# Patient Record
Sex: Female | Born: 1980 | Race: Black or African American | Hispanic: No | Marital: Single | Smoking: Current every day smoker
Health system: Southern US, Community
[De-identification: ages and names within clinical notes are randomized; demographics above are authoritative.]

## PROBLEM LIST (undated history)

## (undated) DIAGNOSIS — F329 Major depressive disorder, single episode, unspecified: Secondary | ICD-10-CM

## (undated) DIAGNOSIS — D649 Anemia, unspecified: Secondary | ICD-10-CM

## (undated) DIAGNOSIS — I10 Essential (primary) hypertension: Secondary | ICD-10-CM

## (undated) DIAGNOSIS — F32A Depression, unspecified: Secondary | ICD-10-CM

## (undated) DIAGNOSIS — L0291 Cutaneous abscess, unspecified: Secondary | ICD-10-CM

## (undated) DIAGNOSIS — A549 Gonococcal infection, unspecified: Secondary | ICD-10-CM

## (undated) DIAGNOSIS — A749 Chlamydial infection, unspecified: Secondary | ICD-10-CM

## (undated) DIAGNOSIS — E0500 Thyrotoxicosis with diffuse goiter without thyrotoxic crisis or storm: Secondary | ICD-10-CM

## (undated) HISTORY — PX: WISDOM TOOTH EXTRACTION: SHX21

---

## 1999-01-29 ENCOUNTER — Inpatient Hospital Stay (HOSPITAL_COMMUNITY): Admission: AD | Admit: 1999-01-29 | Discharge: 1999-01-29 | Payer: Self-pay | Admitting: Obstetrics

## 1999-04-21 ENCOUNTER — Observation Stay (HOSPITAL_COMMUNITY): Admission: AD | Admit: 1999-04-21 | Discharge: 1999-04-22 | Payer: Self-pay | Admitting: Obstetrics

## 1999-05-15 ENCOUNTER — Inpatient Hospital Stay (HOSPITAL_COMMUNITY): Admission: AD | Admit: 1999-05-15 | Discharge: 1999-05-15 | Payer: Self-pay | Admitting: Obstetrics

## 1999-05-26 ENCOUNTER — Inpatient Hospital Stay (HOSPITAL_COMMUNITY): Admission: AD | Admit: 1999-05-26 | Discharge: 1999-05-26 | Payer: Self-pay | Admitting: Obstetrics

## 1999-06-18 ENCOUNTER — Inpatient Hospital Stay (HOSPITAL_COMMUNITY): Admission: AD | Admit: 1999-06-18 | Discharge: 1999-06-20 | Payer: Self-pay | Admitting: Obstetrics

## 2000-01-17 ENCOUNTER — Emergency Department (HOSPITAL_COMMUNITY): Admission: EM | Admit: 2000-01-17 | Discharge: 2000-01-17 | Payer: Self-pay | Admitting: Emergency Medicine

## 2000-01-17 ENCOUNTER — Encounter: Payer: Self-pay | Admitting: Emergency Medicine

## 2000-03-02 ENCOUNTER — Other Ambulatory Visit: Admission: RE | Admit: 2000-03-02 | Discharge: 2000-03-02 | Payer: Self-pay | Admitting: Obstetrics

## 2000-11-21 ENCOUNTER — Other Ambulatory Visit: Admission: RE | Admit: 2000-11-21 | Discharge: 2000-11-21 | Payer: Self-pay | Admitting: Obstetrics

## 2000-11-22 ENCOUNTER — Emergency Department (HOSPITAL_COMMUNITY): Admission: EM | Admit: 2000-11-22 | Discharge: 2000-11-22 | Payer: Self-pay | Admitting: Emergency Medicine

## 2001-09-07 ENCOUNTER — Emergency Department (HOSPITAL_COMMUNITY): Admission: EM | Admit: 2001-09-07 | Discharge: 2001-09-07 | Payer: Self-pay | Admitting: Emergency Medicine

## 2001-10-28 ENCOUNTER — Emergency Department (HOSPITAL_COMMUNITY): Admission: EM | Admit: 2001-10-28 | Discharge: 2001-10-28 | Payer: Self-pay | Admitting: *Deleted

## 2001-10-30 ENCOUNTER — Emergency Department (HOSPITAL_COMMUNITY): Admission: EM | Admit: 2001-10-30 | Discharge: 2001-10-30 | Payer: Self-pay | Admitting: Emergency Medicine

## 2002-06-24 ENCOUNTER — Emergency Department (HOSPITAL_COMMUNITY): Admission: EM | Admit: 2002-06-24 | Discharge: 2002-06-24 | Payer: Self-pay | Admitting: Emergency Medicine

## 2003-07-15 ENCOUNTER — Emergency Department (HOSPITAL_COMMUNITY): Admission: EM | Admit: 2003-07-15 | Discharge: 2003-07-15 | Payer: Self-pay | Admitting: Emergency Medicine

## 2003-07-23 ENCOUNTER — Other Ambulatory Visit: Admission: RE | Admit: 2003-07-23 | Discharge: 2003-07-23 | Payer: Self-pay | Admitting: Family Medicine

## 2004-03-31 ENCOUNTER — Emergency Department (HOSPITAL_COMMUNITY): Admission: EM | Admit: 2004-03-31 | Discharge: 2004-03-31 | Payer: Self-pay | Admitting: Emergency Medicine

## 2004-06-07 ENCOUNTER — Ambulatory Visit: Payer: Self-pay | Admitting: Psychiatry

## 2004-06-07 ENCOUNTER — Other Ambulatory Visit (HOSPITAL_COMMUNITY): Admission: RE | Admit: 2004-06-07 | Discharge: 2004-06-18 | Payer: Self-pay | Admitting: Psychiatry

## 2005-01-17 ENCOUNTER — Emergency Department (HOSPITAL_COMMUNITY): Admission: EM | Admit: 2005-01-17 | Discharge: 2005-01-17 | Payer: Self-pay | Admitting: Family Medicine

## 2005-03-08 ENCOUNTER — Emergency Department (HOSPITAL_COMMUNITY): Admission: EM | Admit: 2005-03-08 | Discharge: 2005-03-08 | Payer: Self-pay | Admitting: Emergency Medicine

## 2005-03-30 ENCOUNTER — Emergency Department (HOSPITAL_COMMUNITY): Admission: EM | Admit: 2005-03-30 | Discharge: 2005-03-30 | Payer: Self-pay | Admitting: Emergency Medicine

## 2005-06-14 ENCOUNTER — Emergency Department (HOSPITAL_COMMUNITY): Admission: EM | Admit: 2005-06-14 | Discharge: 2005-06-14 | Payer: Self-pay | Admitting: Emergency Medicine

## 2005-06-15 ENCOUNTER — Emergency Department (HOSPITAL_COMMUNITY): Admission: EM | Admit: 2005-06-15 | Discharge: 2005-06-15 | Payer: Self-pay | Admitting: Emergency Medicine

## 2005-11-24 ENCOUNTER — Emergency Department (HOSPITAL_COMMUNITY): Admission: EM | Admit: 2005-11-24 | Discharge: 2005-11-24 | Payer: Self-pay | Admitting: Family Medicine

## 2005-11-26 ENCOUNTER — Emergency Department (HOSPITAL_COMMUNITY): Admission: EM | Admit: 2005-11-26 | Discharge: 2005-11-26 | Payer: Self-pay | Admitting: Emergency Medicine

## 2006-11-08 ENCOUNTER — Emergency Department (HOSPITAL_COMMUNITY): Admission: EM | Admit: 2006-11-08 | Discharge: 2006-11-08 | Payer: Self-pay | Admitting: Emergency Medicine

## 2007-08-23 ENCOUNTER — Emergency Department (HOSPITAL_COMMUNITY): Admission: EM | Admit: 2007-08-23 | Discharge: 2007-08-23 | Payer: Self-pay | Admitting: Emergency Medicine

## 2008-05-06 ENCOUNTER — Emergency Department (HOSPITAL_COMMUNITY): Admission: EM | Admit: 2008-05-06 | Discharge: 2008-05-06 | Payer: Self-pay | Admitting: Family Medicine

## 2008-10-16 ENCOUNTER — Emergency Department (HOSPITAL_COMMUNITY): Admission: EM | Admit: 2008-10-16 | Discharge: 2008-10-16 | Payer: Self-pay | Admitting: Emergency Medicine

## 2009-04-02 ENCOUNTER — Emergency Department (HOSPITAL_COMMUNITY): Admission: EM | Admit: 2009-04-02 | Discharge: 2009-04-02 | Payer: Self-pay | Admitting: Emergency Medicine

## 2009-12-01 ENCOUNTER — Emergency Department (HOSPITAL_COMMUNITY): Admission: EM | Admit: 2009-12-01 | Discharge: 2009-12-01 | Payer: Self-pay | Admitting: Family Medicine

## 2010-03-12 ENCOUNTER — Emergency Department (HOSPITAL_COMMUNITY): Admission: EM | Admit: 2010-03-12 | Discharge: 2010-03-12 | Payer: Self-pay | Admitting: Emergency Medicine

## 2010-03-13 ENCOUNTER — Emergency Department (HOSPITAL_COMMUNITY): Admission: EM | Admit: 2010-03-13 | Discharge: 2010-03-13 | Payer: Self-pay | Admitting: Family Medicine

## 2010-09-05 ENCOUNTER — Encounter: Payer: Self-pay | Admitting: Family Medicine

## 2010-11-20 LAB — DIFFERENTIAL
Eosinophils Absolute: 0 10*3/uL (ref 0.0–0.7)
Lymphs Abs: 0.8 10*3/uL (ref 0.7–4.0)
Monocytes Absolute: 0.6 10*3/uL (ref 0.1–1.0)
Monocytes Relative: 8 % (ref 3–12)
Neutrophils Relative %: 80 % — ABNORMAL HIGH (ref 43–77)

## 2010-11-20 LAB — COMPREHENSIVE METABOLIC PANEL
ALT: 14 U/L (ref 0–35)
AST: 19 U/L (ref 0–37)
Albumin: 4 g/dL (ref 3.5–5.2)
Calcium: 8.9 mg/dL (ref 8.4–10.5)
GFR calc Af Amer: >60 mL/min (ref >60)
Glucose, Bld: 119 mg/dL — ABNORMAL HIGH (ref 70–99)
Sodium: 137 mEq/L (ref 135–145)
Total Protein: 7.1 g/dL (ref 6.0–8.3)

## 2010-11-20 LAB — URINALYSIS, ROUTINE W REFLEX MICROSCOPIC
Bilirubin Urine: NEGATIVE
Ketones, ur: NEGATIVE mg/dL
Nitrite: NEGATIVE
Protein, ur: NEGATIVE mg/dL
Urobilinogen, UA: 0.2 mg/dL (ref 0.0–1.0)

## 2010-11-20 LAB — CBC
MCHC: 32.4 g/dL (ref 30.0–36.0)
Platelets: 247 10*3/uL (ref 150–400)
RDW: 14.2 % (ref 11.5–15.5)

## 2010-11-20 LAB — PREGNANCY, URINE: Preg Test, Ur: NEGATIVE

## 2011-06-14 ENCOUNTER — Inpatient Hospital Stay (INDEPENDENT_AMBULATORY_CARE_PROVIDER_SITE_OTHER)
Admission: RE | Admit: 2011-06-14 | Discharge: 2011-06-14 | Disposition: A | Discharge status: Home / Self Care | Payer: Medicaid Other | Source: Ambulatory Visit | Attending: Family Medicine | Admitting: Family Medicine

## 2011-06-14 DIAGNOSIS — J069 Acute upper respiratory infection, unspecified: Secondary | ICD-10-CM

## 2011-07-29 ENCOUNTER — Emergency Department (INDEPENDENT_AMBULATORY_CARE_PROVIDER_SITE_OTHER)
Admission: EM | Admit: 2011-07-29 | Discharge: 2011-07-29 | Disposition: A | Discharge status: Home / Self Care | Payer: Self-pay | Source: Home / Self Care | Attending: Family Medicine | Admitting: Family Medicine

## 2011-07-29 DIAGNOSIS — L03319 Cellulitis of trunk, unspecified: Secondary | ICD-10-CM

## 2011-07-29 DIAGNOSIS — L02219 Cutaneous abscess of trunk, unspecified: Secondary | ICD-10-CM

## 2011-07-29 MED ORDER — DOXYCYCLINE HYCLATE 100 MG PO CAPS: 100.0000 mg | ORAL_CAPSULE | Freq: Two times a day (BID) | ORAL | Status: AC

## 2011-07-29 MED ORDER — IBUPROFEN 600 MG PO TABS: 600.0000 mg | ORAL_TABLET | Freq: Three times a day (TID) | ORAL | Status: AC | PRN

## 2011-07-29 MED ORDER — CHLORHEXIDINE GLUCONATE 4 % EX LIQD: 60.0000 mL | Freq: Every day | CUTANEOUS | Status: AC | PRN

## 2011-07-29 NOTE — ED Notes (Signed)
C/o boil to lt labia since yesterday.  Has been taking a friends doxycycline

## 2011-07-29 NOTE — ED Provider Notes (Signed)
History     CSN: [id number] Arrival date & time: 07/29/2011  6:45 PM   First MD Initiated Contact with Patient 07/29/11 1749      Chief Complaint  Patient presents with  . Recurrent Skin Infections    (Consider location/radiation/quality/duration/timing/severity/associated sxs/prior treatment) HPI Comments: 30 y/o female no chronic comorbidities here c/o "boil" in left pubic area for 2 days. Has h/o boil in arm pit. Pt shaved her pubis about 1 week ago. Area more tender and red, no spontaneous drainage. Took 2 doxycycline pills that got from a friend leftover.   History reviewed. No pertinent past medical history.  History reviewed. No pertinent past surgical history.  No family history on file.  History  Substance Use Topics  . Smoking status: Current Everyday Smoker  . Smokeless tobacco: Not on file  . Alcohol Use: No    OB History    Grav Para Term Preterm Abortions TAB SAB Ect Mult Living                  Review of Systems  Constitutional: Negative for fever, chills and appetite change.  Gastrointestinal: Negative for abdominal pain.  Genitourinary: Negative for vaginal discharge, genital sores and pelvic pain.  Skin:       "Boil"    Allergies  Review of patient's allergies indicates no known allergies.  Home Medications   Current Outpatient Rx  Name Route Sig Dispense Refill  . CHLORHEXIDINE GLUCONATE 4 % EX LIQD Topical Apply 60 mLs (4 application total) topically daily as needed. 120 mL 0  . DOXYCYCLINE HYCLATE 100 MG PO CAPS Oral Take 1 capsule (100 mg total) by mouth 2 (two) times daily. 20 capsule 0  . IBUPROFEN 600 MG PO TABS Oral Take 1 tablet (600 mg total) by mouth every 8 (eight) hours as needed for pain. 20 tablet 0    BP 120/77  Pulse 99  Temp(Src) 98.7 F (37.1 C) (Oral)  Resp 16  LMP 06/28/2011  Physical Exam  Nursing note and vitals reviewed. Constitutional: She is oriented to person, place, and time. She appears well-developed and  well-nourished. No distress.  Cardiovascular: Normal heart sounds.   Pulmonary/Chest: Breath sounds normal.  Abdominal: Soft. There is no tenderness.  Lymphadenopathy:    She has no cervical adenopathy.  Neurological: She is alert and oriented to person, place, and time.  Skin:       Abscess about 3x2cm left suprapubic area. Tender fluctuant.     ED Course  INCISION AND DRAINAGE Date/Time: 07/29/2011 7:34 PM Performed by: Sharin Grave Authorized by: Sharin Grave Consent: Verbal consent obtained. Risks and benefits: risks, benefits and alternatives were discussed Consent given by: patient Patient understanding: patient states understanding of the procedure being performed Type: abscess Location: left suprapubic area. Anesthesia: local infiltration Local anesthetic: lidocaine 1% with epinephrine Anesthetic total: 2 ml Incision type: single straight Complexity: simple Drainage: purulent Drainage amount: copious Wound treatment: wound left open and drain placed Packing material: 1/2 in iodoform gauze Patient tolerance: Patient tolerated the procedure well with no immediate complications.   (including critical care time)   Labs Reviewed  CULTURE, ROUTINE-ABSCESS   No results found.   1. Abscess of pubic region       MDM          Sharin Grave, MD 07/31/11 [phone]

## 2011-07-31 ENCOUNTER — Encounter (HOSPITAL_COMMUNITY): Payer: Self-pay

## 2011-07-31 ENCOUNTER — Emergency Department (INDEPENDENT_AMBULATORY_CARE_PROVIDER_SITE_OTHER)
Admission: EM | Admit: 2011-07-31 | Discharge: 2011-07-31 | Disposition: A | Discharge status: Home / Self Care | Payer: Self-pay | Source: Home / Self Care | Attending: Family Medicine | Admitting: Family Medicine

## 2011-07-31 DIAGNOSIS — L03319 Cellulitis of trunk, unspecified: Secondary | ICD-10-CM

## 2011-07-31 DIAGNOSIS — L02219 Cutaneous abscess of trunk, unspecified: Secondary | ICD-10-CM

## 2011-07-31 HISTORY — DX: Cutaneous abscess, unspecified: L02.91

## 2011-07-31 NOTE — ED Provider Notes (Addendum)
History     CSN: [id number] Arrival date & time: 07/31/2011 11:56 AM   First MD Initiated Contact with Patient 07/31/11 1123      Chief Complaint  Patient presents with  . Wound Check    (Consider location/radiation/quality/duration/timing/severity/associated sxs/prior treatment) Patient is a 30 y.o. female presenting with wound check. The history is provided by the patient.  Wound Check  She was treated in the ED 2 to 3 days ago. Previous treatment in the ED includes I&D of abscess. Treatments since wound repair include oral antibiotics. There has been no drainage from the wound. There is no redness present. There is no swelling present. The pain has no pain. She has no difficulty moving the affected extremity or digit.    Past Medical History  Diagnosis Date  . Abscess     History reviewed. No pertinent past surgical history.  History reviewed. No pertinent family history.  History  Substance Use Topics  . Smoking status: Current Everyday Smoker  . Smokeless tobacco: Not on file  . Alcohol Use: No    OB History    Grav Para Term Preterm Abortions TAB SAB Ect Mult Living                  Review of Systems  Constitutional: Negative.   Skin: Positive for wound.    Allergies  Review of patient's allergies indicates no known allergies.  Home Medications   Current Outpatient Rx  Name Route Sig Dispense Refill  . CHLORHEXIDINE GLUCONATE 4 % EX LIQD Topical Apply 60 mLs (4 application total) topically daily as needed. 120 mL 0  . DOXYCYCLINE HYCLATE 100 MG PO CAPS Oral Take 1 capsule (100 mg total) by mouth 2 (two) times daily. 20 capsule 0  . IBUPROFEN 600 MG PO TABS Oral Take 1 tablet (600 mg total) by mouth every 8 (eight) hours as needed for pain. 20 tablet 0    BP 138/88  Pulse 85  Temp(Src) 97.6 F (36.4 C) (Oral)  Resp 18  SpO2 100%  LMP 06/28/2011  Physical Exam  Nursing note and vitals reviewed. Constitutional: She appears well-developed and  well-nourished.  Skin: Skin is warm and dry.       ED Course  Procedures (including critical care time)  Labs Reviewed - No data to display No results found.   1. Abscess of pubic region       MDM          Barkley Bruns, MD 07/31/11 1203  Barkley Bruns, MD 07/31/11 1203  Barkley Bruns, MD 07/31/11 [phone]

## 2011-07-31 NOTE — ED Notes (Signed)
Pt here to have packing removed from pubic area,  I and d done here on Friday

## 2011-08-02 LAB — CULTURE, ROUTINE-ABSCESS

## 2012-01-06 ENCOUNTER — Encounter (HOSPITAL_COMMUNITY): Payer: Self-pay | Admitting: *Deleted

## 2012-01-06 ENCOUNTER — Emergency Department (HOSPITAL_COMMUNITY)
Admission: EM | Admit: 2012-01-06 | Discharge: 2012-01-07 | Disposition: A | Discharge status: Home / Self Care | Payer: Self-pay | Attending: Emergency Medicine | Admitting: Emergency Medicine

## 2012-01-06 DIAGNOSIS — R300 Dysuria: Secondary | ICD-10-CM | POA: Insufficient documentation

## 2012-01-06 DIAGNOSIS — F17200 Nicotine dependence, unspecified, uncomplicated: Secondary | ICD-10-CM | POA: Insufficient documentation

## 2012-01-06 LAB — URINALYSIS, ROUTINE W REFLEX MICROSCOPIC
Ketones, ur: 15 mg/dL — AB
Nitrite: POSITIVE — AB
Protein, ur: 30 mg/dL — AB
pH: 6 (ref 5.0–8.0)

## 2012-01-06 LAB — URINE MICROSCOPIC-ADD ON

## 2012-01-06 NOTE — ED Notes (Signed)
The pt has had painful urination for the past week.  No nv or diarrhea.  lmp now

## 2012-01-07 MED ORDER — SULFAMETHOXAZOLE-TMP DS 800-160 MG PO TABS
1.0000 | ORAL_TABLET | Freq: Once | ORAL | Status: AC
  Administered 2012-01-07: 1 via ORAL
  Filled 2012-01-07: qty 1

## 2012-01-07 MED ORDER — PHENAZOPYRIDINE HCL 200 MG PO TABS: 200.0000 mg | ORAL_TABLET | Freq: Three times a day (TID) | ORAL | Status: AC

## 2012-01-07 MED ORDER — SULFAMETHOXAZOLE-TRIMETHOPRIM 800-160 MG PO TABS: 1.0000 | ORAL_TABLET | Freq: Two times a day (BID) | ORAL | Status: AC

## 2012-01-07 MED ORDER — PHENAZOPYRIDINE HCL 100 MG PO TABS
200.0000 mg | ORAL_TABLET | Freq: Once | ORAL | Status: AC
  Administered 2012-01-07: 200 mg via ORAL
  Filled 2012-01-07: qty 2

## 2012-01-07 NOTE — ED Notes (Signed)
Pt ambulates to restroom

## 2012-01-07 NOTE — Discharge Instructions (Signed)
You have signs of urinary tract infection.  Use Bactrim for infection and per him for the pain with urination.  Followup with your Dr. if your symptoms.  Last more than 3-4 days after starting the antibiotic.  Return for worse or uncontrolled symptoms

## 2012-01-07 NOTE — ED Provider Notes (Signed)
History     CSN: [id number]  Arrival date & time 01/06/12  2317   First MD Initiated Contact with Patient 01/07/12 0104      Chief Complaint  Patient presents with  . painful urination     (Consider location/radiation/quality/duration/timing/severity/associated sxs/prior treatment) HPI The patient is a 31 year old, female, with no significant past medical history, who presents emergency department complaining of dysuria and frequency.  For the past week.  She denies nausea, vomiting.  She denies back pain, abdominal pain, or fevers, or chills.  She denies vaginal discharge or rash.  In her pelvic area.  She did begin her menstrual cycle.  Today.  Past Medical History  Diagnosis Date  . Abscess     History reviewed. No pertinent past surgical history.  No family history on file.  History  Substance Use Topics  . Smoking status: Current Everyday Smoker  . Smokeless tobacco: Not on file  . Alcohol Use: No    OB History    Grav Para Term Preterm Abortions TAB SAB Ect Mult Living                  Review of Systems  Constitutional: Negative for fever and chills.  Gastrointestinal: Negative for nausea, vomiting and abdominal pain.  Genitourinary: Positive for dysuria and frequency. Negative for flank pain, vaginal discharge and genital sores.  Musculoskeletal: Negative for back pain.  Skin: Negative for rash.  Neurological: Negative for headaches.  Psychiatric/Behavioral: Negative for confusion.  All other systems reviewed and are negative.    Allergies  Percocet and Vicodin  Home Medications   Current Outpatient Rx  Name Route Sig Dispense Refill  . IBUPROFEN 200 MG PO TABS Oral Take 200 mg by mouth every 6 (six) hours as needed. For pain    . AZO TABS PO Oral Take 1 tablet by mouth daily.    Marland Kitchen PHENAZOPYRIDINE HCL 200 MG PO TABS Oral Take 1 tablet (200 mg total) by mouth 3 (three) times daily. 6 tablet 0  . SULFAMETHOXAZOLE-TRIMETHOPRIM 800-160 MG PO TABS Oral  Take 1 tablet by mouth 2 (two) times daily. 20 tablet 0    BP 117/75  Pulse 91  Temp(Src) 98.6 F (37 C) (Oral)  Resp 16  SpO2 100%  LMP 01/06/2012  Physical Exam  Vitals reviewed. Constitutional: She is oriented to person, place, and time. She appears well-developed and well-nourished. No distress.  HENT:  Head: Normocephalic and atraumatic.  Eyes: Conjunctivae are normal.  Neck: Normal range of motion. Neck supple.  Pulmonary/Chest: Effort normal.  Abdominal: Soft. There is no tenderness. There is no guarding.  Genitourinary:       No CVA, tenderness  Musculoskeletal: Normal range of motion.  Neurological: She is alert and oriented to person, place, and time.  Skin: Skin is dry.  Psychiatric: She has a normal mood and affect.    ED Course  Procedures (including critical care time) Probable UTI  Labs Reviewed  URINALYSIS, ROUTINE W REFLEX MICROSCOPIC - Abnormal; Notable for the following:    Color, Urine AMBER (*) BIOCHEMICALS MAY BE AFFECTED BY COLOR   APPearance CLOUDY (*)    Specific Gravity, Urine 1.031 (*)    Hgb urine dipstick TRACE (*)    Ketones, ur 15 (*)    Protein, ur 30 (*)    Nitrite POSITIVE (*)    Leukocytes, UA SMALL (*)    All other components within normal limits  URINE MICROSCOPIC-ADD ON - Abnormal; Notable for the following:  Squamous Epithelial / LPF MANY (*)    Bacteria, UA FEW (*)    Casts HYALINE CASTS (*)    All other components within normal limits  PREGNANCY, URINE   No results found.   1. Dysuria       MDM  Probable UTI, with no signs of systemic illness.  No evidence of pyelonephritis.         Cheri Guppy, MD 01/07/12 [phone]

## 2012-01-07 NOTE — ED Notes (Signed)
Pt presents with c/o urinary frequency and abdominal cramping  x 1 week. Denies n/v. MD at bedside for eval

## 2012-04-29 ENCOUNTER — Encounter (HOSPITAL_COMMUNITY): Payer: Self-pay | Admitting: *Deleted

## 2012-04-29 ENCOUNTER — Emergency Department (INDEPENDENT_AMBULATORY_CARE_PROVIDER_SITE_OTHER)
Admission: EM | Admit: 2012-04-29 | Discharge: 2012-04-29 | Disposition: A | Discharge status: Home / Self Care | Payer: Self-pay | Source: Home / Self Care | Attending: Emergency Medicine | Admitting: Emergency Medicine

## 2012-04-29 DIAGNOSIS — N390 Urinary tract infection, site not specified: Secondary | ICD-10-CM

## 2012-04-29 LAB — POCT URINALYSIS DIP (DEVICE)
Glucose, UA: NEGATIVE mg/dL
Ketones, ur: NEGATIVE mg/dL
Specific Gravity, Urine: >=1.03 (ref 1.005–1.030)

## 2012-04-29 LAB — WET PREP, GENITAL: Yeast Wet Prep HPF POC: NONE SEEN

## 2012-04-29 LAB — POCT PREGNANCY, URINE: Preg Test, Ur: NEGATIVE

## 2012-04-29 MED ORDER — CEPHALEXIN 500 MG PO CAPS: 500.0000 mg | ORAL_CAPSULE | Freq: Three times a day (TID) | ORAL | Status: AC

## 2012-04-29 NOTE — ED Provider Notes (Signed)
History     CSN: [id number]  Arrival date & time 04/29/12  1415   First MD Initiated Contact with Patient 04/29/12 1451      Chief Complaint  Patient presents with  . Urinary Frequency  . Abdominal Pain  . SEXUALLY TRANSMITTED DISEASE    (Consider location/radiation/quality/duration/timing/severity/associated sxs/prior treatment) HPI Comments: Patient process urgent care complaining of about a week of increased frequency pressure with urination and also requests to be checked for STDs although denies any vaginal discharge or protected intercourse. She also describes that she has not been resting and sleeping well as her mother passed away suddenly in the month of August. Has been expressing significant anxiety and insomnia since then. Denies any constitutional symptoms such as fevers generalized malaise arthralgias myalgias or spontaneous abdominal pain. Describes discomfort in her lower abdomen is related to cramping discomfort or when she's urinating. Occasionally feels a sensation that she still needs to urinate but she can't talk. Denies nausea vomiting or flank pain  Patient is a 31 y.o. female presenting with frequency and abdominal pain. The history is provided by the patient.  Urinary Frequency This is a new problem. The current episode started more than 1 week ago. The problem occurs constantly. The problem has not changed since onset.Associated symptoms include abdominal pain. Pertinent negatives include no chest pain, no headaches and no shortness of breath. Nothing aggravates the symptoms. Nothing relieves the symptoms. She has tried nothing for the symptoms.  Abdominal Pain The primary symptoms of the illness include abdominal pain and dysuria. The primary symptoms of the illness do not include fever, shortness of breath, vaginal discharge or vaginal bleeding.  The dysuria is associated with frequency and urgency. The dysuria is not associated with hematuria, dyspareunia or  vaginal pain.  Additional symptoms associated with the illness include urgency and frequency. Symptoms associated with the illness do not include hematuria or back pain.    Past Medical History  Diagnosis Date  . Abscess     History reviewed. No pertinent past surgical history.  History reviewed. No pertinent family history.  History  Substance Use Topics  . Smoking status: Current Every Day Smoker  . Smokeless tobacco: Not on file  . Alcohol Use: No    OB History    Grav Para Term Preterm Abortions TAB SAB Ect Mult Living                  Review of Systems  Constitutional: Negative for fever, activity change, appetite change and unexpected weight change.  Respiratory: Negative for shortness of breath.   Cardiovascular: Negative for chest pain.  Gastrointestinal: Positive for abdominal pain.  Genitourinary: Positive for dysuria, urgency and frequency. Negative for hematuria, vaginal bleeding, vaginal discharge, vaginal pain, menstrual problem, pelvic pain and dyspareunia.  Musculoskeletal: Negative for back pain.  Neurological: Negative for headaches.    Allergies  Percocet and Vicodin  Home Medications   Current Outpatient Rx  Name Route Sig Dispense Refill  . CEPHALEXIN 500 MG PO CAPS Oral Take 1 capsule (500 mg total) by mouth 3 (three) times daily. 21 capsule 0  . IBUPROFEN 200 MG PO TABS Oral Take 200 mg by mouth every 6 (six) hours as needed. For pain    . AZO TABS PO Oral Take 1 tablet by mouth daily.      BP 109/73  Pulse 98  Temp 98.6 F (37 C) (Oral)  Resp 16  SpO2 100%  LMP 04/07/2012  Physical Exam  Nursing note  and vitals reviewed. Constitutional: She appears well-developed and well-nourished.  HENT:  Head: Normocephalic and atraumatic.  Mouth/Throat: No oropharyngeal exudate.  Eyes: Conjunctivae normal are normal. No scleral icterus.  Neck: Neck supple.  Abdominal: There is no tenderness.  Musculoskeletal: She exhibits no tenderness.    Neurological: She is alert.  Skin: No rash noted.    ED Course  Procedures (including critical care time)  Labs Reviewed  POCT URINALYSIS DIP (DEVICE) - Abnormal; Notable for the following:    Hgb urine dipstick LARGE (*)     Protein, ur 100 (*)     Nitrite POSITIVE (*)     Leukocytes, UA MODERATE (*)  Biochemical Testing Only. Please order routine urinalysis from main lab if confirmatory testing is needed.   All other components within normal limits  WET PREP, GENITAL - Abnormal; Notable for the following:    Clue Cells Wet Prep HPF POC RARE (*)     WBC, Wet Prep HPF POC MODERATE (*)     All other components within normal limits  POCT PREGNANCY, URINE  GC/CHLAMYDIA PROBE AMP, GENITAL  URINE CULTURE   No results found.   1. Urinary tract infection       MDM  Patient with an abnormal urine, and urinary symptoms. Treated patient with Keflex over a urine culture. As well as obtained samples for wet prep and Chlamydia DNA DNA probe to rule out STD exposure.       Jimmie Molly, MD 04/29/12 [phone]

## 2012-04-29 NOTE — ED Notes (Signed)
Pt with urinary frequency/low abd pressure/denies vaginal discharge has had unprotected intercourse - wants std check - pt also not sleeping well - mother passed away suddenly Apr 28, 2023 this year - mothers birthday this past week - increased anxiety

## 2012-05-01 LAB — URINE CULTURE

## 2012-05-03 NOTE — ED Notes (Signed)
GC/Chlamydia neg., Wet prep: rare clue cells, mod. WBC's, Urine culture: >100,000 colonies E. Coli. Pt. adequately treated with Keflex. Sarah Fritz 05/03/2012

## 2012-06-24 ENCOUNTER — Emergency Department (HOSPITAL_COMMUNITY)
Admission: EM | Admit: 2012-06-24 | Discharge: 2012-06-24 | Disposition: A | Discharge status: Home / Self Care | Payer: Self-pay | Attending: Emergency Medicine | Admitting: Emergency Medicine

## 2012-06-24 ENCOUNTER — Encounter (HOSPITAL_COMMUNITY): Payer: Self-pay | Admitting: Emergency Medicine

## 2012-06-24 DIAGNOSIS — F17200 Nicotine dependence, unspecified, uncomplicated: Secondary | ICD-10-CM | POA: Insufficient documentation

## 2012-06-24 DIAGNOSIS — Z872 Personal history of diseases of the skin and subcutaneous tissue: Secondary | ICD-10-CM | POA: Insufficient documentation

## 2012-06-24 DIAGNOSIS — Z79899 Other long term (current) drug therapy: Secondary | ICD-10-CM | POA: Insufficient documentation

## 2012-06-24 DIAGNOSIS — R350 Frequency of micturition: Secondary | ICD-10-CM | POA: Insufficient documentation

## 2012-06-24 DIAGNOSIS — N390 Urinary tract infection, site not specified: Secondary | ICD-10-CM

## 2012-06-24 LAB — URINALYSIS, ROUTINE W REFLEX MICROSCOPIC
Protein, ur: 100 mg/dL — AB
Urobilinogen, UA: 0.2 mg/dL (ref 0.0–1.0)

## 2012-06-24 LAB — URINE MICROSCOPIC-ADD ON

## 2012-06-24 MED ORDER — IBUPROFEN 600 MG PO TABS: 600.0000 mg | ORAL_TABLET | Freq: Four times a day (QID) | ORAL | Status: DC | PRN

## 2012-06-24 MED ORDER — SULFAMETHOXAZOLE-TRIMETHOPRIM 800-160 MG PO TABS: 1.0000 | ORAL_TABLET | Freq: Two times a day (BID) | ORAL | Status: DC

## 2012-06-24 NOTE — ED Notes (Signed)
Patient states that she is having urinary frequency and burning with urination. The patient denies any abdominal pain

## 2012-06-24 NOTE — ED Notes (Signed)
Patient given discharge instructions, information, prescriptions, and diet order. Patient states that they adequately understand discharge information given and to return to ED if symptoms return or worsen.     

## 2012-06-24 NOTE — ED Provider Notes (Signed)
History     CSN: [id number]  Arrival date & time 06/24/12  1545   First MD Initiated Contact with Patient 06/24/12 1711      Chief Complaint  Patient presents with  . Urinary Tract Infection  . Urinary Frequency    (Consider location/radiation/quality/duration/timing/severity/associated sxs/prior treatment) HPI Comments: Is a 31 year old female, who presents to the emergency department with chief complaint of UTI. Patient endorses pain with urination and frequency. She denies chest pain shortness of breath, nausea, vomiting, abdominal pain, constipation, diarrhea, numbness or tingling in the extremities. She has not tried anything for her pain. Symptoms have lasted for the past couple of days. Her symptoms have been worsening.  Patient is a 31 y.o. female presenting with frequency. The history is provided by the patient. No language interpreter was used.  Urinary Frequency    Past Medical History  Diagnosis Date  . Abscess     History reviewed. No pertinent past surgical history.  History reviewed. No pertinent family history.  History  Substance Use Topics  . Smoking status: Current Every Day Smoker  . Smokeless tobacco: Not on file  . Alcohol Use: No    OB History    Grav Para Term Preterm Abortions TAB SAB Ect Mult Living                  Review of Systems  Genitourinary: Positive for frequency.  All other systems reviewed and are negative.    Allergies  Percocet and Vicodin  Home Medications   Current Outpatient Rx  Name  Route  Sig  Dispense  Refill  . ADULT MULTIVITAMIN W/MINERALS CH   Oral   Take 1 tablet by mouth daily.         . AZO TABS PO   Oral   Take 1 tablet by mouth daily.         . IBUPROFEN 600 MG PO TABS   Oral   Take 1 tablet (600 mg total) by mouth every 6 (six) hours as needed for pain.   30 tablet   0   . SULFAMETHOXAZOLE-TRIMETHOPRIM 800-160 MG PO TABS   Oral   Take 1 tablet by mouth every 12 (twelve) hours.   20  tablet   0     BP 121/82  Pulse 111  Temp 98.9 F (37.2 C) (Oral)  Resp 18  Wt 102 lb (46.267 kg)  SpO2 100%  LMP 06/03/2012  Physical Exam  Nursing note and vitals reviewed. Constitutional: She is oriented to person, place, and time. She appears well-developed and well-nourished.  HENT:  Head: Normocephalic and atraumatic.  Eyes: Conjunctivae normal and EOM are normal. Pupils are equal, round, and reactive to light.  Neck: Normal range of motion. Neck supple.  Cardiovascular: Normal rate and regular rhythm.  Exam reveals no gallop and no friction rub.   No murmur heard. Pulmonary/Chest: Effort normal and breath sounds normal. No respiratory distress. She has no wheezes. She has no rales. She exhibits no tenderness.  Abdominal: Soft. Bowel sounds are normal. She exhibits no distension and no mass. There is no tenderness. There is no rebound and no guarding.  Musculoskeletal: Normal range of motion. She exhibits no edema and no tenderness.  Neurological: She is alert and oriented to person, place, and time.  Skin: Skin is warm and dry.  Psychiatric: She has a normal mood and affect. Her behavior is normal. Judgment and thought content normal.    ED Course  Procedures (including critical care  time)  Labs Reviewed  URINALYSIS, ROUTINE W REFLEX MICROSCOPIC - Abnormal; Notable for the following:    Color, Urine AMBER (*)  BIOCHEMICALS MAY BE AFFECTED BY COLOR   APPearance TURBID (*)     Hgb urine dipstick LARGE (*)     Protein, ur 100 (*)     Nitrite POSITIVE (*)     Leukocytes, UA LARGE (*)     All other components within normal limits  URINE MICROSCOPIC-ADD ON - Abnormal; Notable for the following:    Bacteria, UA MANY (*)     All other components within normal limits  POCT PREGNANCY, URINE  URINE CULTURE   No results found.   1. UTI (lower urinary tract infection)       MDM  31 year old female with UTI. I'm going to discharge the patient on Bactrim as well as  give the patient some ibuprofen for pain. Patient is agreeable with this. Discussed proper wiping technique with the patient in an effort to prevent further UTIs. Patient is stable and ready for discharge.        Roxy Horseman, PA-C 06/24/12 [phone]

## 2012-06-26 LAB — URINE CULTURE: Colony Count: >=100000

## 2012-06-26 NOTE — ED Provider Notes (Signed)
Medical screening examination/treatment/procedure(s) were performed by non-physician practitioner and as supervising physician I was immediately available for consultation/collaboration.    Nelia Shi, MD 06/26/12 [phone]

## 2012-06-27 NOTE — ED Notes (Signed)
+  Urine Patient treated with Sulfa-trim sensitive to same-chart appended per protocol MD. 

## 2012-07-09 ENCOUNTER — Emergency Department (HOSPITAL_COMMUNITY)
Admission: EM | Admit: 2012-07-09 | Discharge: 2012-07-09 | Discharge status: Absent without leave | Payer: Self-pay | Source: Home / Self Care

## 2013-04-14 ENCOUNTER — Inpatient Hospital Stay (HOSPITAL_COMMUNITY)
Admission: AD | Admit: 2013-04-14 | Discharge: 2013-04-14 | Disposition: A | Discharge status: Home / Self Care | Payer: Self-pay | Source: Ambulatory Visit | Attending: Obstetrics & Gynecology | Admitting: Obstetrics & Gynecology

## 2013-04-14 ENCOUNTER — Encounter (HOSPITAL_COMMUNITY): Payer: Self-pay | Admitting: *Deleted

## 2013-04-14 DIAGNOSIS — N390 Urinary tract infection, site not specified: Secondary | ICD-10-CM | POA: Insufficient documentation

## 2013-04-14 DIAGNOSIS — R350 Frequency of micturition: Secondary | ICD-10-CM | POA: Insufficient documentation

## 2013-04-14 HISTORY — DX: Major depressive disorder, single episode, unspecified: F32.9

## 2013-04-14 HISTORY — DX: Depression, unspecified: F32.A

## 2013-04-14 LAB — URINALYSIS, ROUTINE W REFLEX MICROSCOPIC
Bilirubin Urine: NEGATIVE
Ketones, ur: NEGATIVE mg/dL
Nitrite: POSITIVE — AB
Protein, ur: 100 mg/dL — AB
Specific Gravity, Urine: 1.025 (ref 1.005–1.030)
Urobilinogen, UA: 1 mg/dL (ref 0.0–1.0)

## 2013-04-14 LAB — URINE MICROSCOPIC-ADD ON

## 2013-04-14 LAB — WET PREP, GENITAL: Yeast Wet Prep HPF POC: NONE SEEN

## 2013-04-14 MED ORDER — SULFAMETHOXAZOLE-TRIMETHOPRIM 800-160 MG PO TABS: 1.0000 | ORAL_TABLET | Freq: Two times a day (BID) | ORAL | Status: DC

## 2013-04-14 NOTE — MAU Note (Signed)
Pt c/o of UTI symptoms. Frequency and pressure/urgency and odorous urine. Symptoms have been x1 week. Taking AZO OTC.

## 2013-04-14 NOTE — MAU Provider Note (Signed)
Chief Complaint: Urinary Tract Infection   First Provider Initiated Contact with Patient 04/14/13 1400     SUBJECTIVE HPI: Sarah Fritz is a 32 y.o. G1P1001 who presents to maternity admissions reporting constant lower abdominal pain, burning with urination, and urinary frequency.  She has had frequent UTIs and believes this is another infection.  She would also like testing for STDs.  She denies vaginal bleeding, vaginal itching/burning, urinary symptoms, h/a, dizziness, n/v, or fever/chills.     Past Medical History  Diagnosis Date  . Abscess   . Depression    Past Surgical History  Procedure Laterality Date  . No past surgeries     History   Social History  . Marital Status: Divorced    Spouse Name: N/A    Number of Children: N/A  . Years of Education: N/A   Occupational History  . Not on file.   Social History Main Topics  . Smoking status: Current Every Day Smoker  . Smokeless tobacco: Not on file  . Alcohol Use: Yes     Comment: occasional  . Drug Use: Yes    Special: Cocaine     Comment: 3 days ago  . Sexual Activity: Yes    Birth Control/ Protection: None   Other Topics Concern  . Not on file   Social History Narrative  . No narrative on file   No current facility-administered medications on file prior to encounter.   No current outpatient prescriptions on file prior to encounter.   Allergies  Allergen Reactions  . Percocet [Oxycodone-Acetaminophen] Nausea And Vomiting and Other (See Comments)    Sweating, pass out  . Vicodin [Hydrocodone-Acetaminophen] Nausea And Vomiting and Other (See Comments)    Sweating, pass out    ROS: Pertinent items in HPI  OBJECTIVE Blood pressure 113/74, pulse 98, temperature 97.9 F (36.6 C), temperature source Oral, resp. rate 18, height 5\' 2"  (1.575 m), weight 97 lb 12.8 oz (44.362 kg), last menstrual period 03/31/2013. GENERAL: Well-developed, well-nourished female in no acute distress.  HEENT:  Normocephalic HEART: normal rate RESP: normal effort ABDOMEN: Soft, non-tender Negative CVA tenderness EXTREMITIES: Nontender, no edema NEURO: Alert and oriented Pelvic exam: Cervix pink, visually closed, without lesion, scant thin white discharge, vaginal walls and external genitalia normal Bimanual exam: Cervix 0/long/high, firm, anterior, neg CMT, uterus nontender, nonenlarged, adnexa without tenderness, enlargement, or mass  LAB RESULTS Results for orders placed during the hospital encounter of 04/14/13 (from the past 24 hour(s))  URINALYSIS, ROUTINE W REFLEX MICROSCOPIC     Status: Abnormal   Collection Time    04/14/13  1:03 PM      Result Value Range   Color, Urine YELLOW  YELLOW   APPearance CLOUDY (*) CLEAR   Specific Gravity, Urine 1.025  1.005 - 1.030   pH 6.5  5.0 - 8.0   Glucose, UA NEGATIVE  NEGATIVE mg/dL   Hgb urine dipstick MODERATE (*) NEGATIVE   Bilirubin Urine NEGATIVE  NEGATIVE   Ketones, ur NEGATIVE  NEGATIVE mg/dL   Protein, ur 161 (*) NEGATIVE mg/dL   Urobilinogen, UA 1.0  0.0 - 1.0 mg/dL   Nitrite POSITIVE (*) NEGATIVE   Leukocytes, UA LARGE (*) NEGATIVE  URINE MICROSCOPIC-ADD ON     Status: Abnormal   Collection Time    04/14/13  1:03 PM      Result Value Range   Squamous Epithelial / LPF FEW (*) RARE   WBC, UA TOO NUMEROUS TO COUNT  <3 WBC/hpf  RBC / HPF 0-2  <3 RBC/hpf   Bacteria, UA MANY (*) RARE  POCT PREGNANCY, URINE     Status: None   Collection Time    04/14/13  1:15 PM      Result Value Range   Preg Test, Ur NEGATIVE  NEGATIVE    ASSESSMENT 1. UTI (lower urinary tract infection)     PLAN Discharge home Bactrim DS BID x7 days [facility] pending Return to MAU as needed    Sharen Counter Certified Nurse-Midwife 04/14/2013  2:08 PM

## 2013-04-16 LAB — URINE CULTURE: Colony Count: >=100000

## 2013-04-17 LAB — GC/CHLAMYDIA PROBE AMP
CT Probe RNA: POSITIVE — AB
GC Probe RNA: NEGATIVE

## 2013-04-18 ENCOUNTER — Other Ambulatory Visit: Payer: Self-pay | Admitting: Advanced Practice Midwife

## 2013-04-18 DIAGNOSIS — A749 Chlamydial infection, unspecified: Secondary | ICD-10-CM | POA: Insufficient documentation

## 2013-04-18 DIAGNOSIS — N390 Urinary tract infection, site not specified: Secondary | ICD-10-CM | POA: Insufficient documentation

## 2013-04-18 MED ORDER — CIPROFLOXACIN HCL 500 MG PO TABS: 500.0000 mg | ORAL_TABLET | Freq: Two times a day (BID) | ORAL | Status: DC

## 2013-04-18 MED ORDER — DOXYCYCLINE HYCLATE 50 MG PO CAPS: 100.0000 mg | ORAL_CAPSULE | Freq: Two times a day (BID) | ORAL | Status: DC

## 2013-04-18 NOTE — Progress Notes (Signed)
Dx with UTI 04/14/2013 MAU visit. Prescribed Bactrim, but culture shows resistance. Will change antibiotic to Cipro twice a day x1 week (due to history of recurrent UTI).   +Chlamydia. Rx doxycycline. Please notify patient of these antibiotic prescriptions.  [place], CNM 04/18/2013 4:47 AM

## 2013-04-19 ENCOUNTER — Other Ambulatory Visit: Payer: Self-pay | Admitting: General Practice

## 2013-04-19 DIAGNOSIS — A749 Chlamydial infection, unspecified: Secondary | ICD-10-CM

## 2013-04-19 MED ORDER — AZITHROMYCIN 250 MG PO TABS: 1000.0000 mg | ORAL_TABLET | Freq: Once | ORAL | Status: DC

## 2013-04-19 NOTE — Progress Notes (Signed)
Called patient and informed her of results and medications available for pickup. Patient states she was notified the other day but has not got her medications yet and tried to call us yesterday to come in for treatment of her chlamydia. Told her she did not need to come in but the medication would be at her pharmacy and reminded her to inform her partner(s) so they can get treatment as well and to abstain from intercourse for one week following treatment. Patient verbalized understanding to all and had no further questions

## 2013-04-19 NOTE — Addendum Note (Signed)
Addended by: Kathee Delton on: 04/19/2013 11:40 AM   Modules accepted: Orders

## 2013-04-22 ENCOUNTER — Telehealth: Payer: Self-pay | Admitting: General Practice

## 2013-04-22 NOTE — Telephone Encounter (Signed)
Patient called and left message stating she went to pick up her prescriptions for the chlamydia and UTI and was told she has another Rx at the pharmacy for her skin and she wants to know what this is for. Called patient back stating I was returning her phone call and told her to disregard the doxycycline since she has already taking the zithromax. Patient verbalized understanding and had no further questions

## 2013-06-20 ENCOUNTER — Other Ambulatory Visit: Payer: Self-pay

## 2013-12-27 ENCOUNTER — Emergency Department (INDEPENDENT_AMBULATORY_CARE_PROVIDER_SITE_OTHER): Payer: Self-pay

## 2013-12-27 ENCOUNTER — Emergency Department (INDEPENDENT_AMBULATORY_CARE_PROVIDER_SITE_OTHER)
Admission: EM | Admit: 2013-12-27 | Discharge: 2013-12-27 | Disposition: A | Discharge status: Home / Self Care | Payer: Self-pay | Source: Home / Self Care | Attending: Emergency Medicine | Admitting: Emergency Medicine

## 2013-12-27 ENCOUNTER — Encounter (HOSPITAL_COMMUNITY): Payer: Self-pay | Admitting: Emergency Medicine

## 2013-12-27 DIAGNOSIS — S63659A Sprain of metacarpophalangeal joint of unspecified finger, initial encounter: Secondary | ICD-10-CM

## 2013-12-27 DIAGNOSIS — N390 Urinary tract infection, site not specified: Secondary | ICD-10-CM

## 2013-12-27 LAB — POCT URINALYSIS DIP (DEVICE)
Bilirubin Urine: NEGATIVE
GLUCOSE, UA: NEGATIVE mg/dL
Ketones, ur: 15 mg/dL — AB
Nitrite: NEGATIVE
Protein, ur: 30 mg/dL — AB
SPECIFIC GRAVITY, URINE: 1.02 (ref 1.005–1.030)
UROBILINOGEN UA: 0.2 mg/dL (ref 0.0–1.0)
pH: 6 (ref 5.0–8.0)

## 2013-12-27 MED ORDER — CEPHALEXIN 500 MG PO CAPS: 500.0000 mg | ORAL_CAPSULE | Freq: Three times a day (TID) | ORAL | Status: DC

## 2013-12-27 MED ORDER — PHENAZOPYRIDINE HCL 200 MG PO TABS: 200.0000 mg | ORAL_TABLET | Freq: Three times a day (TID) | ORAL | Status: DC | PRN

## 2013-12-27 NOTE — ED Provider Notes (Signed)
Chief Complaint   Chief Complaint  Patient presents with  . Urinary Tract Infection  . Hand Pain    History of Present Illness   Sarah Fritz is a 33 year old female who's had a one-week history of dysuria, frequency, urgency, and hematuria. She denies any vaginal itching or odor. She's had a slight whitish discharge. She's had pain in her lower back and lower abdomen. She denies fever, chills, nausea, and vomiting. She has had a history of urinary tract infections, most recent one being last September.  She also has had right hand pain. 2 weeks ago she bent her little finger back. Ever since then there has been pain over the MCP joint of the right little finger and some swelling. She's able to fully flex and fully extend. No numbness or tingling.  Review of Systems   Other than as noted above, the patient denies any of the following symptoms: General:  No fevers or chills. GI:  No abdominal pain, back pain, nausea, or vomiting. GU:  No hematuria or incontinence. GYN:  No discharge, itching, vulvar pain or lesions, pelvic pain, or abnormal vaginal bleeding.  PMFSH   Past medical history, family history, social history, meds, and allergies were reviewed.  Current meds include lithium and Seroquel. She has a history of depression.  Physical Examination     Vital signs:  BP 128/93  Pulse 79  Temp(Src) 98.7 F (37.1 C) (Oral)  Resp 18  SpO2 100%  LMP 12/13/2013 Gen:  Alert, oriented, in no distress. Lungs:  Clear to auscultation, no wheezes, rales or rhonchi. Heart:  Regular rhythm, no gallop or murmer. Abdomen:  Flat and soft. There was slight suprapubic pain to palpation.  No guarding, or rebound.  No hepato-splenomegaly or mass.  Bowel sounds were normally active.  No hernia. Back:  No CVA tenderness.  Extremities: She has pain to palpation over the MCP joint of the right little finger. There is no swelling or deformity. The joint has a full range of motion actively  and passively with minimal pain. Skin:  Clear, warm and dry.  Labs   Results for orders placed during the hospital encounter of 12/27/13  POCT URINALYSIS DIP (DEVICE)      Result Value Ref Range   Glucose, UA NEGATIVE  NEGATIVE mg/dL   Bilirubin Urine NEGATIVE  NEGATIVE   Ketones, ur 15 (*) NEGATIVE mg/dL   Specific Gravity, Urine 1.020  1.005 - 1.030   Hgb urine dipstick SMALL (*) NEGATIVE   pH 6.0  5.0 - 8.0   Protein, ur 30 (*) NEGATIVE mg/dL   Urobilinogen, UA 0.2  0.0 - 1.0 mg/dL   Nitrite NEGATIVE  NEGATIVE   Leukocytes, UA LARGE (*) NEGATIVE     A urine culture was obtained.  Results are pending at this time and we will call about any positive results.  Radiology   Dg Finger Little Right  12/27/2013   CLINICAL DATA:  Hyperextension injury RIGHT little finger 3 weeks ago, continued pain and intermittent swelling most severe at MCP joint  EXAM: RIGHT LITTLE FINGER 2+V  COMPARISON:  None  FINDINGS: Osseous mineralization normal.  Joint spaces preserved.  No fracture, dislocation, or bone destruction.  IMPRESSION: No acute osseous abnormalities.   Electronically Signed   By: Ulyses SouthwardMark  Boles M.D.   On: 12/27/2013 16:54   I reviewed the images independently and personally and concur with the radiologist's findings.  [facility]   She was put in a  finger splint and the fingers were buddy taped.  Assessment   The primary encounter diagnosis was UTI (lower urinary tract infection). A diagnosis of Sprain, MCP, hand, right was also pertinent to this visit.   No evidence of pyelonephritis.    Suggested that she wear the finger splint continuously for the next 4 weeks, then take it off and start range of motion exercises.  Plan   1.  Meds:  The following meds were prescribed:   Discharge Medication List as of 12/27/2013  5:19 PM    START taking these medications   Details  cephALEXin (KEFLEX) 500 MG capsule Take 1 capsule (500 mg total) by mouth 3 (three) times  daily., Starting 12/27/2013, Until Discontinued, Normal    phenazopyridine (PYRIDIUM) 200 MG tablet Take 1 tablet (200 mg total) by mouth 3 (three) times daily as needed for pain., Starting 12/27/2013, Until Discontinued, Normal        2.  Patient Education/Counseling:  The patient was given appropriate handouts, self care instructions, and instructed in symptomatic relief. The patient was told to avoid intercourse for 10 days, get extra fluids, and return for a follow up with her primary care doctor at the completion of treatment for a repeat UA and culture.    3.  Follow up:  The patient was told to follow up here if no better in 3 to 4 days, or sooner if becoming worse in any way, and given some red flag symptoms such as fever, persistent vomiting, or severe flank or abdominal pain which would prompt immediate return.     Reuben Likesavid C Connery Shiffler, MD 12/27/13 2225

## 2013-12-27 NOTE — ED Notes (Signed)
Reports urinary urgency and pressure x 1 wk.  Also having pain in right hand pinky side.  States "think finger got bent back when grabbing a grocery bag".

## 2013-12-27 NOTE — Discharge Instructions (Signed)
Urinary Tract Infection Urinary tract infections (UTIs) can develop anywhere along your urinary tract. Your urinary tract is your body's drainage system for removing wastes and extra water. Your urinary tract includes two kidneys, two ureters, a bladder, and a urethra. Your kidneys are a pair of bean-shaped organs. Each kidney is about the size of your fist. They are located below your ribs, one on each side of your spine. CAUSES Infections are caused by microbes, which are microscopic organisms, including fungi, viruses, and bacteria. These organisms are so small that they can only be seen through a microscope. Bacteria are the microbes that most commonly cause UTIs. SYMPTOMS  Symptoms of UTIs may vary by age and gender of the patient and by the location of the infection. Symptoms in young women typically include a frequent and intense urge to urinate and a painful, burning feeling in the bladder or urethra during urination. Older women and men are more likely to be tired, shaky, and weak and have muscle aches and abdominal pain. A fever may mean the infection is in your kidneys. Other symptoms of a kidney infection include pain in your back or sides below the ribs, nausea, and vomiting. DIAGNOSIS To diagnose a UTI, your caregiver will ask you about your symptoms. Your caregiver also will ask to provide a urine sample. The urine sample will be tested for bacteria and white blood cells. White blood cells are made by your body to help fight infection. TREATMENT  Typically, UTIs can be treated with medication. Because most UTIs are caused by a bacterial infection, they usually can be treated with the use of antibiotics. The choice of antibiotic and length of treatment depend on your symptoms and the type of bacteria causing your infection. HOME CARE INSTRUCTIONS  If you were prescribed antibiotics, take them exactly as your caregiver instructs you. Finish the medication even if you feel better after you  have only taken some of the medication.  Drink enough water and fluids to keep your urine clear or pale yellow.  Avoid caffeine, tea, and carbonated beverages. They tend to irritate your bladder.  Empty your bladder often. Avoid holding urine for long periods of time.  Empty your bladder before and after sexual intercourse.  After a bowel movement, women should cleanse from front to back. Use each tissue only once. SEEK MEDICAL CARE IF:   You have back pain.  You develop a fever.  Your symptoms do not begin to resolve within 3 days. SEEK IMMEDIATE MEDICAL CARE IF:   You have severe back pain or lower abdominal pain.  You develop chills.  You have nausea or vomiting.  You have continued burning or discomfort with urination. MAKE SURE YOU:   Understand these instructions.  Will watch your condition.  Will get help right away if you are not doing well or get worse. Document Released: 05/11/2005 Document Revised: 01/31/2012 Document Reviewed: 09/09/2011 [facility] Patient Information 2014 [place], [place].  Ligament Sprain Ligaments are tough, fibrous tissues that hold bones together at the joints. A sprain can occur when a ligament is stretched. This injury may take several weeks to heal. HOME CARE INSTRUCTIONS   Rest the injured area for as long as directed by your caregiver. Then slowly start using the joint as directed by your caregiver and as the pain allows.  Keep the affected joint raised if possible to lessen swelling.  Apply ice for 15-20 minutes to the injured area every couple hours for the first half day, then 03-04  times per day for the first 48 hours. Put the ice in a plastic bag and place a towel between the bag of ice and your skin.  Wear any splinting, casting, or elastic bandage applications as instructed.  Only take over-the-counter or prescription medicines for pain, discomfort, or fever as directed by your caregiver. Do not use aspirin immediately after  the injury unless instructed by your caregiver. Aspirin can cause increased bleeding and bruising of the tissues.  If you were given crutches, continue to use them as instructed and do not resume weight bearing on the affected extremity until instructed. SEEK MEDICAL CARE IF:   Your bruising, swelling, or pain increases.  You have cold and numb fingers or toes if your arm or leg was injured. SEEK IMMEDIATE MEDICAL CARE IF:   Your toes are numb or blue if your leg was injured.  Your fingers are numb or blue if your arm was injured.  Your pain is not responding to medicines and continues to stay the same or gets worse. MAKE SURE YOU:   Understand these instructions.  Will watch your condition.  Will get help right away if you are not doing well or get worse. Document Released: 07/29/2000 Document Revised: 10/24/2011 Document Reviewed: 05/27/2008 [facility] Patient Information 2014 [place], [place].

## 2013-12-29 LAB — URINE CULTURE: SPECIAL REQUESTS: NORMAL

## 2013-12-30 NOTE — Progress Notes (Signed)
Quick Note:  Results are abnormal as noted, but have been adequately treated. No further action necessary. ______ 

## 2013-12-30 NOTE — ED Notes (Signed)
Urine culture: >100,000 colonies Proteus Mirabilis.  Pt. adequately treated with Keflex.  Dr. Lorenz CoasterKeller has reviewed. Desiree LucySuzanne M [facility] 12/30/2013

## 2014-01-01 ENCOUNTER — Encounter (HOSPITAL_COMMUNITY): Payer: Self-pay | Admitting: *Deleted

## 2014-01-01 ENCOUNTER — Inpatient Hospital Stay (HOSPITAL_COMMUNITY)
Admission: AD | Admit: 2014-01-01 | Discharge: 2014-01-01 | Disposition: A | Discharge status: Home / Self Care | Payer: Self-pay | Source: Ambulatory Visit | Attending: Obstetrics & Gynecology | Admitting: Obstetrics & Gynecology

## 2014-01-01 DIAGNOSIS — F3289 Other specified depressive episodes: Secondary | ICD-10-CM | POA: Insufficient documentation

## 2014-01-01 DIAGNOSIS — A5901 Trichomonal vulvovaginitis: Secondary | ICD-10-CM

## 2014-01-01 DIAGNOSIS — F329 Major depressive disorder, single episode, unspecified: Secondary | ICD-10-CM | POA: Insufficient documentation

## 2014-01-01 DIAGNOSIS — D649 Anemia, unspecified: Secondary | ICD-10-CM | POA: Insufficient documentation

## 2014-01-01 DIAGNOSIS — F17200 Nicotine dependence, unspecified, uncomplicated: Secondary | ICD-10-CM | POA: Insufficient documentation

## 2014-01-01 DIAGNOSIS — Z8249 Family history of ischemic heart disease and other diseases of the circulatory system: Secondary | ICD-10-CM | POA: Insufficient documentation

## 2014-01-01 HISTORY — DX: Anemia, unspecified: D64.9

## 2014-01-01 LAB — URINALYSIS, ROUTINE W REFLEX MICROSCOPIC
BILIRUBIN URINE: NEGATIVE
Glucose, UA: NEGATIVE mg/dL
HGB URINE DIPSTICK: NEGATIVE
KETONES UR: NEGATIVE mg/dL
Nitrite: NEGATIVE
PROTEIN: NEGATIVE mg/dL
Specific Gravity, Urine: 1.02 (ref 1.005–1.030)
UROBILINOGEN UA: 0.2 mg/dL (ref 0.0–1.0)
pH: 6.5 (ref 5.0–8.0)

## 2014-01-01 LAB — URINE MICROSCOPIC-ADD ON

## 2014-01-01 LAB — WET PREP, GENITAL
Clue Cells Wet Prep HPF POC: NONE SEEN
YEAST WET PREP: NONE SEEN

## 2014-01-01 LAB — POCT PREGNANCY, URINE: Preg Test, Ur: NEGATIVE

## 2014-01-01 MED ORDER — METRONIDAZOLE 500 MG PO TABS
2000.0000 mg | ORAL_TABLET | Freq: Once | ORAL | Status: AC
  Administered 2014-01-01: 2000 mg via ORAL
  Filled 2014-01-01: qty 4

## 2014-01-01 NOTE — MAU Note (Addendum)
Boy[facility] got dx with trich, [facility] only did [facility] pee test. Being treated for UTI.  Here to get treated

## 2014-01-01 NOTE — MAU Provider Note (Signed)
History     CSN: [id number]  Arrival date and time: 01/01/14 1600   First Provider Initiated Contact with Patient 01/01/14 1651      No chief complaint on file.  HPI Comments: Sarah Fritz 33 y.o. female presents to MAU for STI screening. [facility] Was told by her current sexual partner that he had been diagnosed with trichomonas and that she needed to be tested.   Past Medical History  Diagnosis Date  . Abscess   . Depression   . Anemia     Past Surgical History  Procedure Laterality Date  . No past surgeries      Family History  Problem Relation Age of Onset  . Heart disease Mother   . Kidney disease Mother   . COPD Mother   . Cancer Mother   . Cancer Father   . COPD Father     History  Substance Use Topics  . Smoking status: Current Every Day Smoker  . Smokeless tobacco: Not on file  . Alcohol Use: Yes     Comment: occasional    Allergies:  Allergies  Allergen Reactions  . Percocet [Oxycodone-Acetaminophen] Nausea And Vomiting and Other (See Comments)    Sweating, pass out  . Vicodin [Hydrocodone-Acetaminophen] Nausea And Vomiting and Other (See Comments)    Sweating, pass out    Prescriptions prior to admission  Medication Sig Dispense Refill  . cephALEXin (KEFLEX) 500 MG capsule Take 1 capsule (500 mg total) by mouth 3 (three) times daily.  30 capsule  0    Review of Systems  Constitutional: Negative.   Respiratory: Negative.   Cardiovascular: Negative.   Gastrointestinal: Negative for abdominal pain.  Genitourinary:       UTI, currently on Keflex   Neurological: Negative.   Psychiatric/Behavioral: Negative.    Physical Exam   Blood pressure 120/79, pulse 96, temperature 99.1 F (37.3 C), temperature source Oral, resp. rate 16, height 5\' 3"  (1.6 Fritz), weight 47.174 kg (104 lb), last menstrual period 12/13/2013.  Physical Exam  Nursing note and vitals reviewed. HENT:  Head: Normocephalic  and atraumatic.  Eyes: Pupils are equal, round, and reactive to light.  Respiratory: Effort normal.  GI: Soft. There is no tenderness.  Genitourinary:  Genital: Pseudo-pustular,raised lesion present at 3 o'clock position, no pain with palpation Vaginal: Milky-white discharge present Cervix: No lesions noted Bimanual: Negative CMT. No adnexal masses    Results for orders placed during the hospital encounter of 01/01/14 (from the past 24 hour(s))  URINALYSIS, ROUTINE W REFLEX MICROSCOPIC     Status: Abnormal   Collection Time    01/01/14  4:20 PM      Result Value Ref Range   Color, Urine YELLOW  YELLOW   APPearance CLEAR  CLEAR   Specific Gravity, Urine 1.020  1.005 - 1.030   pH 6.5  5.0 - 8.0   Glucose, UA NEGATIVE  NEGATIVE mg/dL   Hgb urine dipstick NEGATIVE  NEGATIVE   Bilirubin Urine NEGATIVE  NEGATIVE   Ketones, ur NEGATIVE  NEGATIVE mg/dL   Protein, ur NEGATIVE  NEGATIVE mg/dL   Urobilinogen, UA 0.2  0.0 - 1.0 mg/dL   Nitrite NEGATIVE  NEGATIVE   Leukocytes, UA MODERATE (*) NEGATIVE  URINE MICROSCOPIC-ADD ON     Status: Abnormal   Collection Time    01/01/14  4:20 PM      Result Value Ref Range   Squamous Epithelial / LPF RARE  RARE   WBC, UA 3-6  <3 WBC/hpf   RBC / HPF 0-2  <3 RBC/hpf   Bacteria, UA FEW (*) RARE   Urine-Other TRICHOMONAS PRESENT    WET PREP, GENITAL     Status: Abnormal   Collection Time    01/01/14  5:07 PM      Result Value Ref Range   Yeast Wet Prep HPF POC NONE SEEN  NONE SEEN   Trich, Wet Prep MODERATE (*) NONE SEEN   Clue Cells Wet Prep HPF POC NONE SEEN  NONE SEEN   WBC, Wet Prep HPF POC MANY (*) NONE SEEN  POCT PREGNANCY, URINE     Status: None   Collection Time    01/01/14  5:22 PM      Result Value Ref Range   Preg Test, Ur NEGATIVE  NEGATIVE     MAU Course  Procedures  MDM Wet Prep, GC/Chlamydia   Assessment and Plan  A: Trich   P:Flagyl 2 Grams PO NOW Partner has been treated Await other STD results    Sarah Fritz  Sarah Fritz 01/01/2014, 5:18 PM

## 2014-01-01 NOTE — MAU Note (Addendum)
Pt states her boyfriend was checked last week and got a call back and was told he had [place]

## 2014-01-01 NOTE — Discharge Instructions (Signed)
Trichomoniasis Trichomoniasis is an infection, caused by the Trichomonas organism, that affects both women and men. In women, the outer female genitalia and the vagina are affected. In men, the penis is mainly affected, but the prostate and other reproductive organs can also be involved. Trichomoniasis is a sexually transmitted disease (STD) and is most often passed to another person through sexual contact. The majority of people who get trichomoniasis do so from a sexual encounter and are also at risk for other STDs. CAUSES   Sexual intercourse with an infected partner.  It can be present in swimming pools or hot tubs. SYMPTOMS   Abnormal gray-green frothy vaginal discharge in women.  Vaginal itching and irritation in women.  Itching and irritation of the area outside the vagina in women.  Penile discharge with or without pain in males.  Inflammation of the urethra (urethritis), causing painful urination.  Bleeding after sexual intercourse. RELATED COMPLICATIONS  Pelvic inflammatory disease.  Infection of the uterus (endometritis).  Infertility.  Tubal (ectopic) pregnancy.  It can be associated with other STDs, including gonorrhea and chlamydia, hepatitis B, and HIV. COMPLICATIONS DURING PREGNANCY  Early (premature) delivery.  Premature rupture of the membranes (PROM).  Low birth weight. DIAGNOSIS   Visualization of Trichomonas under the microscope from the vagina discharge.  Ph of the vagina greater than 4.5, tested with a test tape.  Trich Rapid Test.  Culture of the organism, but this is not usually needed.  It may be found on a Pap test.  Having a "strawberry cervix,"which means the cervix looks very red like a strawberry. TREATMENT   You may be given medication to fight the infection. Inform your caregiver if you could be or are pregnant. Some medications used to treat the infection should not be taken during pregnancy.  Over-the-counter medications or  creams to decrease itching or irritation may be recommended.  Your sexual partner will need to be treated if infected. HOME CARE INSTRUCTIONS   Take all medication prescribed by your caregiver.  Take over-the-counter medication for itching or irritation as directed by your caregiver.  Do not have sexual intercourse while you have the infection.  Do not douche or wear tampons.  Discuss your infection with your partner, as your partner may have acquired the infection from you. Or, your partner may have been the person who transmitted the infection to you.  Have your sex partner examined and treated if necessary.  Practice safe, informed, and protected sex.  See your caregiver for other STD testing. SEEK MEDICAL CARE IF:   You still have symptoms after you finish the medication.  You have an oral temperature above 102 F (38.9 C).  You develop belly (abdominal) pain.  You have pain when you urinate.  You have bleeding after sexual intercourse.  You develop a rash.  The medication makes you sick or makes you throw up (vomit). Document Released: 01/25/2001 Document Revised: 10/24/2011 Document Reviewed: 02/20/2009 [facility] Patient Information 2014 [place]Care, [place].  Sexually Transmitted Disease A sexually transmitted disease (STD) is a disease or infection that may be passed (transmitted) from person to person, usually during sexual activity. This may happen by way of saliva, semen, blood, vaginal mucus, or urine. Common STDs include:   Gonorrhea.   Chlamydia.   Syphilis.   HIV and AIDS.   Genital herpes.   Hepatitis B and C.   Trichomonas.   Human papillomavirus (HPV).   Pubic lice.   Scabies.  Mites.  Bacterial vaginosis. WHAT ARE CAUSES OF  STDs? An STD may be caused by bacteria, a virus, or parasites. STDs are often transmitted during sexual activity if one person is infected. However, they may also be transmitted through nonsexual means. STDs  may be transmitted after:   Sexual intercourse with an infected person.   Sharing sex toys with an infected person.   Sharing needles with an infected person or using unclean piercing or tattoo needles.  Having intimate contact with the genitals, mouth, or rectal areas of an infected person.   Exposure to infected fluids during birth. WHAT ARE THE SIGNS AND SYMPTOMS OF STDs? Different STDs have different symptoms. Some people may not have any symptoms. If symptoms are present, they may include:   Painful or bloody urination.   Pain in the pelvis, abdomen, vagina, anus, throat, or eyes.   Skin rash, itching, irritation, growths, sores (lesions), ulcerations, or warts in the genital or anal area.  Abnormal vaginal discharge with or without bad odor.   Penile discharge in men.   Fever.   Pain or bleeding during sexual intercourse.   Swollen glands in the groin area.   Yellow skin and eyes (jaundice). This is seen with hepatitis.   Swollen testicles.  Infertility.  Sores and blisters in the mouth. HOW ARE STDs DIAGNOSED? To make a diagnosis, your health care provider may:   Take a medical history.   Perform a physical exam.   Take a sample of any discharge for examination.  Swab the throat, cervix, opening to the penis, rectum, or vagina for testing.  Test a sample of your first morning urine.   Perform blood tests.   Perform a Pap smear, if this applies.   Perform a colposcopy.   Perform a laparoscopy.  HOW ARE STDs TREATED? Treatment depends on the STD. Some STDs may be treated but not cured.   Chlamydia, gonorrhea, trichomonas, and syphilis can be cured with antibiotics.   Genital herpes, hepatitis, and HIV can be treated, but not cured, with prescribed medicines. The medicines lessen symptoms.   Genital warts from HPV can be treated with medicine or by freezing, burning (electrocautery), or surgery. Warts may come back.   HPV  cannot be cured with medicine or surgery. However, abnormal areas may be removed from the cervix, vagina, or vulva.   If your diagnosis is confirmed, your recent sexual partners need treatment. This is true even if they are symptom-free or have a negative culture or evaluation. They should not have sex until their health care providers say it is OK. HOW CAN I REDUCE MY RISK OF GETTING AN STD?  Use latex condoms, dental dams, and water-soluble lubricants during sexual activity. Do not use petroleum jelly or oils.  Get vaccinated for HPV and hepatitis. If you have not received these vaccines in the past, talk to your health care provider about whether one or both might be right for you.   Avoid risky sex practices that can break the skin.  WHAT SHOULD I DO IF I THINK I HAVE AN STD?  See your health care provider.   Inform all sexual partners. They should be tested and treated for any STDs.  Do not have sex until your health care provider says it is OK. WHEN SHOULD I GET HELP? Seek immediate medical care if:  You develop severe abdominal pain.  You are a man and notice swelling or pain in the testicles.  You are a woman and notice swelling or pain in your vagina. Document Released: 10/22/2002  Document Revised: 05/22/2013 Document Reviewed: 02/19/2013 [facility] Patient Information 2014 [place]ExitCare, [place]LL[place].

## 2014-01-02 LAB — HIV ANTIBODY (ROUTINE TESTING W REFLEX): HIV 1&2 Ab, 4th Generation: NONREACTIVE

## 2014-01-02 LAB — GC/CHLAMYDIA PROBE AMP
CT PROBE, AMP APTIMA: NEGATIVE
GC Probe RNA: NEGATIVE

## 2014-05-30 ENCOUNTER — Other Ambulatory Visit: Payer: Self-pay

## 2014-06-16 ENCOUNTER — Encounter (HOSPITAL_COMMUNITY): Payer: Self-pay | Admitting: *Deleted

## 2014-10-31 ENCOUNTER — Emergency Department (HOSPITAL_COMMUNITY)
Admission: EM | Admit: 2014-10-31 | Discharge: 2014-10-31 | Disposition: A | Discharge status: Home / Self Care | Payer: Self-pay | Attending: Emergency Medicine | Admitting: Emergency Medicine

## 2014-10-31 ENCOUNTER — Emergency Department (HOSPITAL_COMMUNITY): Payer: Self-pay

## 2014-10-31 ENCOUNTER — Encounter (HOSPITAL_COMMUNITY): Payer: Self-pay

## 2014-10-31 DIAGNOSIS — B9689 Other specified bacterial agents as the cause of diseases classified elsewhere: Secondary | ICD-10-CM

## 2014-10-31 DIAGNOSIS — R52 Pain, unspecified: Secondary | ICD-10-CM

## 2014-10-31 DIAGNOSIS — Z720 Tobacco use: Secondary | ICD-10-CM | POA: Insufficient documentation

## 2014-10-31 DIAGNOSIS — N939 Abnormal uterine and vaginal bleeding, unspecified: Secondary | ICD-10-CM

## 2014-10-31 DIAGNOSIS — Z862 Personal history of diseases of the blood and blood-forming organs and certain disorders involving the immune mechanism: Secondary | ICD-10-CM | POA: Insufficient documentation

## 2014-10-31 DIAGNOSIS — Z3202 Encounter for pregnancy test, result negative: Secondary | ICD-10-CM | POA: Insufficient documentation

## 2014-10-31 DIAGNOSIS — Z792 Long term (current) use of antibiotics: Secondary | ICD-10-CM | POA: Insufficient documentation

## 2014-10-31 DIAGNOSIS — F319 Bipolar disorder, unspecified: Secondary | ICD-10-CM | POA: Insufficient documentation

## 2014-10-31 DIAGNOSIS — N760 Acute vaginitis: Secondary | ICD-10-CM | POA: Insufficient documentation

## 2014-10-31 DIAGNOSIS — Z872 Personal history of diseases of the skin and subcutaneous tissue: Secondary | ICD-10-CM | POA: Insufficient documentation

## 2014-10-31 LAB — URINALYSIS, ROUTINE W REFLEX MICROSCOPIC
Glucose, UA: NEGATIVE mg/dL
Ketones, ur: 15 mg/dL — AB
Leukocytes, UA: NEGATIVE
Nitrite: NEGATIVE
Protein, ur: NEGATIVE mg/dL
Specific Gravity, Urine: 1.027 (ref 1.005–1.030)
Urobilinogen, UA: 1 mg/dL (ref 0.0–1.0)
pH: 6.5 (ref 5.0–8.0)

## 2014-10-31 LAB — CBC WITH DIFFERENTIAL/PLATELET
BASOS PCT: 0 % (ref 0–1)
Basophils Absolute: 0 10*3/uL (ref 0.0–0.1)
EOS PCT: 1 % (ref 0–5)
Eosinophils Absolute: 0.1 10*3/uL (ref 0.0–0.7)
HCT: 39.5 % (ref 36.0–46.0)
HEMOGLOBIN: 12.8 g/dL (ref 12.0–15.0)
LYMPHS ABS: 2.3 10*3/uL (ref 0.7–4.0)
Lymphocytes Relative: 39 % (ref 12–46)
MCH: 23.8 pg — AB (ref 26.0–34.0)
MCHC: 32.4 g/dL (ref 30.0–36.0)
MCV: 73.6 fL — AB (ref 78.0–100.0)
MONO ABS: 0.7 10*3/uL (ref 0.1–1.0)
MONOS PCT: 11 % (ref 3–12)
NEUTROS PCT: 49 % (ref 43–77)
Neutro Abs: 2.8 10*3/uL (ref 1.7–7.7)
Platelets: 285 10*3/uL (ref 150–400)
RBC: 5.37 MIL/uL — AB (ref 3.87–5.11)
RDW: 15.2 % (ref 11.5–15.5)
WBC: 5.8 10*3/uL (ref 4.0–10.5)

## 2014-10-31 LAB — COMPREHENSIVE METABOLIC PANEL
ALBUMIN: 5 g/dL (ref 3.5–5.2)
ALK PHOS: 47 U/L (ref 39–117)
ALT: 12 U/L (ref 0–35)
AST: 18 U/L (ref 0–37)
Anion gap: 7 (ref 5–15)
BUN: 11 mg/dL (ref 6–23)
CO2: 28 mmol/L (ref 19–32)
Calcium: 9.7 mg/dL (ref 8.4–10.5)
Chloride: 106 mmol/L (ref 96–112)
Creatinine, Ser: 0.84 mg/dL (ref 0.50–1.10)
GFR calc Af Amer: >90 mL/min (ref >90)
GFR calc non Af Amer: 90 mL/min — ABNORMAL LOW (ref >90)
Glucose, Bld: 90 mg/dL (ref 70–99)
POTASSIUM: 3.8 mmol/L (ref 3.5–5.1)
SODIUM: 141 mmol/L (ref 135–145)
Total Bilirubin: 2.3 mg/dL — ABNORMAL HIGH (ref 0.3–1.2)
Total Protein: 8.2 g/dL (ref 6.0–8.3)

## 2014-10-31 LAB — RAPID URINE DRUG SCREEN, HOSP PERFORMED
Amphetamines: NOT DETECTED
Barbiturates: NOT DETECTED
Benzodiazepines: NOT DETECTED
Cocaine: POSITIVE — AB
Opiates: NOT DETECTED
Tetrahydrocannabinol: POSITIVE — AB

## 2014-10-31 LAB — URINE MICROSCOPIC-ADD ON

## 2014-10-31 LAB — WET PREP, GENITAL
Trich, Wet Prep: NONE SEEN
WBC, Wet Prep HPF POC: NONE SEEN
Yeast Wet Prep HPF POC: NONE SEEN

## 2014-10-31 LAB — ETHANOL: Alcohol, Ethyl (B): <5 mg/dL (ref 0–9)

## 2014-10-31 LAB — PREGNANCY, URINE: Preg Test, Ur: NEGATIVE

## 2014-10-31 MED ORDER — METRONIDAZOLE 500 MG PO TABS: 500.0000 mg | ORAL_TABLET | Freq: Two times a day (BID) | ORAL | Status: DC
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
## 2014-10-31 NOTE — ED Notes (Signed)
US at bedside

## 2014-10-31 NOTE — ED Notes (Signed)
Pt is talking with a TTS representative.

## 2014-10-31 NOTE — Discharge Instructions (Signed)
Take Flagyl twice daily for 7 days. It is important to complete the entire course of antibiotic. Do not have sexual intercourse for 2 weeks from when you start taking Flagyl. [facility]. Also follow-up with one of the resources provided today for psychiatry regarding your psychiatric medications.  Abnormal Uterine Bleeding Abnormal uterine bleeding can affect women at various stages in life, including teenagers, women in their reproductive years, pregnant women, and women who have reached menopause. Several kinds of uterine bleeding are considered abnormal, including:  Bleeding or spotting between periods.   Bleeding after sexual intercourse.   Bleeding that is heavier or more than normal.   Periods that last longer than usual.  Bleeding after menopause.  Many cases of abnormal uterine bleeding are minor and simple to treat, while others are more serious. Any type of abnormal bleeding should be evaluated by your health care provider. Treatment will depend on the cause of the bleeding. HOME CARE INSTRUCTIONS Monitor your condition for any changes. The following actions may help to alleviate any discomfort you are experiencing:  Avoid the use of tampons and douches as directed by your health care provider.  Change your pads frequently. You should get regular pelvic exams and Pap tests. Keep all follow-up appointments for diagnostic tests as directed by your health care provider.  SEEK MEDICAL CARE IF:   Your bleeding lasts more than 1 week.   You feel dizzy at times.  SEEK IMMEDIATE MEDICAL CARE IF:   You pass out.   You are changing pads every 15 to 30 minutes.   You have abdominal pain.  You have a fever.   You become sweaty or weak.   You are passing large blood clots from the vagina.   You start to feel nauseous and vomit. MAKE SURE YOU:   Understand these instructions.  Will watch your condition.  Will get help right  away if you are not doing well or get worse. Document Released: 08/01/2005 Document Revised: 08/06/2013 Document Reviewed: 02/28/2013 [facility] Patient Information 2015 [place], [place]. This information is not intended to replace advice given to you by your health care provider. Make sure you discuss any questions you have with your health care provider.  Bipolar Disorder Bipolar disorder is a mental illness. The term bipolar disorder actually is used to describe a group of disorders that all share varying degrees of emotional highs and lows that can interfere with daily functioning, such as work, school, or relationships. Bipolar disorder also can lead to drug abuse, hospitalization, and suicide. The emotional highs of bipolar disorder are periods of elation or irritability and high energy. These highs can range from a mild form (hypomania) to a severe form (mania). People experiencing episodes of hypomania may appear energetic, excitable, and highly productive. People experiencing mania may behave impulsively or erratically. They often make poor decisions. They may have difficulty sleeping. The most severe episodes of mania can involve having very distorted beliefs or perceptions about the world and seeing or hearing things that are not real (psychotic delusions and hallucinations).  The emotional lows of bipolar disorder (depression) also can range from mild to severe. Severe episodes of bipolar depression can involve psychotic delusions and hallucinations. Sometimes people with bipolar disorder experience a state of mixed mood. Symptoms of hypomania or mania and depression are both present during this mixed-mood episode. SIGNS AND SYMPTOMS There are signs and symptoms of the episodes of hypomania and mania as well as the episodes of depression.  The signs and symptoms of hypomania and mania are similar but vary in severity. They include:  Inflated self-esteem or feeling of increased  self-confidence.  Decreased need for sleep.  Unusual talkativeness (rapid or pressured speech) or the feeling of a need to keep talking.  Sensation of racing thoughts or constant talking, with quick shifts between topics that may or may not be related (flight of ideas).  Decreased ability to focus or concentrate.  Increased purposeful activity, such as work, studies, or social activity, or nonproductive activity, such as pacing, squirming and fidgeting, or finger and toe tapping.  Impulsive behavior and use of poor judgment, resulting in high-risk activities, such as having unprotected sex or spending excessive amounts of money. Signs and symptoms of depression include the following:   Feelings of sadness, hopelessness, or helplessness.  Frequent or uncontrollable episodes of crying.  Lack of feeling anything or caring about anything.  Difficulty sleeping or sleeping too much.  Inability to enjoy the things you used to enjoy.   Desire to be alone all the time.   Feelings of guilt or worthlessness.  Lack of energy or motivation.   Difficulty concentrating, remembering, or making decisions.  Change in appetite or weight beyond normal fluctuations.  Thoughts of death or the desire to harm yourself. DIAGNOSIS  Bipolar disorder is diagnosed through an assessment by your caregiver. Your caregiver will ask questions about your emotional episodes. There are two main types of bipolar disorder. People with type I bipolar disorder have manic episodes with or without depressive episodes. People with type II bipolar disorder have hypomanic episodes and major depressive episodes, which are more serious than mild depression. The type of bipolar disorder you have can make an important difference in how your illness is monitored and treated. Your caregiver may ask questions about your medical history and use of alcohol or drugs, including prescription medication. Certain medical conditions  and substances also can cause emotional highs and lows that resemble bipolar disorder (secondary bipolar disorder).  TREATMENT  Bipolar disorder is a long-term illness. It is best controlled with continuous treatment rather than treatment only when symptoms occur. The following treatments can be prescribed for bipolar disorders:  Medication--Medication can be prescribed by a doctor that is an expert in treating mental disorders (psychiatrists). Medications called mood stabilizers are usually prescribed to help control the illness. Other medications are sometimes added if symptoms of mania, depression, or psychotic delusions and hallucinations occur despite the use of a mood stabilizer.  Talk therapy--Some forms of talk therapy are helpful in providing support, education, and guidance. A combination of medication and talk therapy is best for managing the disorder over time. A procedure in which electricity is applied to your brain through your scalp (electroconvulsive therapy) is used in cases of severe mania when medication and talk therapy do not work or work too slowly. Document Released: 11/07/2000 Document Revised: 11/26/2012 Document Reviewed: 08/27/2012 [facility] Patient Information 2015 [place]. This information is not intended to replace advice given to you by your health care provider. Make sure you discuss any questions you have with your health care provider.

## 2014-10-31 NOTE — ED Notes (Signed)
Per pt, she had menstrual period 2 weeks ago.  Now having discharge with intercourse.  Slight abdominal pain.  Pt noticed discharge post intercourse.  Also has hemorrhoids.  [facility] to be seen to get back on bipolar meds.  Has been handling things for a year but noticed she has been more angry.  Snapping at family.  [facility]  Has MD on 3/28.  Just got insurance.

## 2014-10-31 NOTE — ED Notes (Signed)
Patient continues to deny SI/HI during the ED visit to this nurse Patient signed Assessment No-Harm Contract at time of DC Patient provided with list of Outpatient resources Patient upset because one of the facilities listed on resource sheet told her to come to ED This nurse asked the patient if she would like to speak with ED charge nurse or [facility]--patient declined Patient did not want repeat VS at time of DC home Patient in NAD at time of DC home

## 2014-10-31 NOTE — ED Provider Notes (Signed)
CSN: [id number]     Arrival date & time 10/31/14  1334 History   First MD Initiated Contact with Patient 10/31/14 1806     Chief Complaint  Patient presents with  . Vaginal Bleeding  . Abdominal Pain     (Consider location/radiation/quality/duration/timing/severity/associated sxs/prior Treatment) HPI Comments: 34 y/o F PMHx of bipolar, anxiety and anemia presenting with vaginal bleeding 2 days. States her last menstrual period lasted from March 1 to the fifth and was normal., 2 days ago she started to have some pink on the toilet tissue, however was unsure if this was from her hemorrhoids. Denies any pain in the area of her hemorrhoids. Last night, she had "rough" intercourse with her boyfriend, and afterwards, noted she had dark clots coming from her vagina when she urinates. Denies dysuria or vaginal pain. She endorses mild, cramping lower abdominal pain. Denies fevers, nausea or vomiting. She is also requesting an evaluation for her medications. States she went to [facility] earlier today since she has been off her medications for bipolar in over a year, and was advised to come to the emergency department. I spoke to her cousin on the phone, and cousin states the patient will "snap" at random and has no control over her emotions. She has an appointment scheduled with [facility] on March 28 to establish primary care. Denies suicidal or homicidal ideations.  The history is provided by the patient and a relative.    Past Medical History  Diagnosis Date  . Abscess   . Depression   . Anemia    Past Surgical History  Procedure Laterality Date  . No past surgeries     Family History  Problem Relation Age of Onset  . Heart disease Mother   . Kidney disease Mother   . COPD Mother   . Cancer Mother   . Cancer Father   . COPD Father    History  Substance Use Topics  . Smoking status: Current Every Day Smoker  . Smokeless tobacco: Not on file  . Alcohol Use: Yes     Comment:  occasional   OB History    Gravida Para Term Preterm AB TAB SAB Ectopic Multiple Living   1 1 1       1      Review of Systems  10 Systems reviewed and are negative for acute change except as noted in the HPI.  Allergies  Percocet and Vicodin  Home Medications   Prior to Admission medications   Medication Sig Start Date End Date Taking? Authorizing Provider  cephALEXin (KEFLEX) 500 MG capsule Take 1 capsule (500 mg total) by mouth 3 (three) times daily. Patient not taking: Reported on 10/31/2014 12/27/13   Reuben Likesavid C Keller, MD  metroNIDAZOLE (FLAGYL) 500 MG tablet Take 1 tablet (500 mg total) by mouth 2 (two) times daily. One po bid x 7 days 10/31/14   Nada Boozerobyn M Aniesha Haughn, PA-C   BP 134/90 mmHg  Pulse 69  Temp(Src) 98.9 F (37.2 C) (Oral)  Resp 16  SpO2 100%  LMP 10/30/2014 Physical Exam  Constitutional: She is oriented to person, place, and time. She appears well-developed and well-nourished. No distress.  HENT:  Head: Normocephalic and atraumatic.  Mouth/Throat: Oropharynx is clear and moist.  Eyes: Conjunctivae are normal.  Neck: Normal range of motion. Neck supple.  Cardiovascular: Normal rate, regular rhythm and normal heart sounds.   Pulmonary/Chest: Effort normal and breath sounds normal.  Abdominal: Soft. Bowel sounds are normal.  Mild suprapubic tenderness. No  peritoneal signs. No CVA tenderness.  Genitourinary: Uterus normal. Cervix exhibits no motion tenderness, no discharge and no friability. Right adnexum displays no mass, no tenderness and no fullness. Left adnexum displays no mass, no tenderness and no fullness. There is bleeding in the vagina. No erythema or tenderness in the vagina. No signs of injury around the vagina.  Musculoskeletal: Normal range of motion. She exhibits no edema.  Neurological: She is alert and oriented to person, place, and time.  Skin: Skin is warm and dry. She is not diaphoretic.  Psychiatric: Her affect is angry. She is agitated. She exhibits a  depressed mood. She expresses no homicidal and no suicidal ideation.    ED Course  Procedures (including critical care time) Labs Review Labs Reviewed  WET PREP, GENITAL - Abnormal; Notable for the following:    Clue Cells Wet Prep HPF POC FEW (*)    All other components within normal limits  CBC WITH DIFFERENTIAL/PLATELET - Abnormal; Notable for the following:    RBC 5.37 (*)    MCV 73.6 (*)    MCH 23.8 (*)    All other components within normal limits  COMPREHENSIVE METABOLIC PANEL - Abnormal; Notable for the following:    Total Bilirubin 2.3 (*)    GFR calc non Af Amer 90 (*)    All other components within normal limits  URINALYSIS, ROUTINE W REFLEX MICROSCOPIC - Abnormal; Notable for the following:    Color, Urine AMBER (*)    Hgb urine dipstick LARGE (*)    Bilirubin Urine SMALL (*)    Ketones, ur 15 (*)    All other components within normal limits  URINE MICROSCOPIC-ADD ON - Abnormal; Notable for the following:    Bacteria, UA FEW (*)    All other components within normal limits  PREGNANCY, URINE  ETHANOL  URINE RAPID DRUG SCREEN (HOSP PERFORMED)  GC/CHLAMYDIA PROBE AMP ([place])    Imaging Review US Transvaginal Non-ob  10/31/2014   CLINICAL DATA:  34 year old female with left pelvic pain for 2 days.  EXAM: TRANSABDOMINAL AND TRANSVAGINAL ULTRASOUND OF PELVIS  TECHNIQUE: Both transabdominal and transvaginal ultrasound examinations of the pelvis were performed. Transabdominal technique was performed for global imaging of the pelvis including uterus, ovaries, adnexal regions, and pelvic cul-de-sac. It was necessary to proceed with endovaginal exam following the transabdominal exam to visualize the endometrium and ovaries.  COMPARISON:  None  FINDINGS: Uterus  Measurements: 7.5 x 3.2 x 3.7 cm. No fibroids or other mass visualized.  Endometrium  Thickness: 3 mm.  No focal abnormality visualized.  Right ovary  Measurements: 3.5 x 3 x 2.5 cm. Normal appearance/no adnexal mass.   Left ovary  Measurements: 3.8 x 2.3 x 1.7 cm. Normal appearance/no adnexal mass.  Other findings  No free fluid or adnexal mass.  IMPRESSION: Normal pelvic ultrasound.   Electronically Signed   By: Harmon Pier M.D.   On: 10/31/2014 21:50   US Pelvis Complete  10/31/2014   CLINICAL DATA:  34 year old female with left pelvic pain for 2 days.  EXAM: TRANSABDOMINAL AND TRANSVAGINAL ULTRASOUND OF PELVIS  TECHNIQUE: Both transabdominal and transvaginal ultrasound examinations of the pelvis were performed. Transabdominal technique was performed for global imaging of the pelvis including uterus, ovaries, adnexal regions, and pelvic cul-de-sac. It was necessary to proceed with endovaginal exam following the transabdominal exam to visualize the endometrium and ovaries.  COMPARISON:  None  FINDINGS: Uterus  Measurements: 7.5 x 3.2 x 3.7 cm. No fibroids or other mass  visualized.  Endometrium  Thickness: 3 mm.  No focal abnormality visualized.  Right ovary  Measurements: 3.5 x 3 x 2.5 cm. Normal appearance/no adnexal mass.  Left ovary  Measurements: 3.8 x 2.3 x 1.7 cm. Normal appearance/no adnexal mass.  Other findings  No free fluid or adnexal mass.  IMPRESSION: Normal pelvic ultrasound.   Electronically Signed   By: Harmon Pier M.D.   On: 10/31/2014 21:50     EKG Interpretation None      MDM   Final diagnoses:  Vaginal bleeding  Bipolar affective disorder, most recent episode unspecified type, remission status unspecified  BV (bacterial vaginosis)   Nontoxic appearing, NAD. AF VSS. Abdomen soft with mild suprapubic tenderness. Pelvic exam with vaginal bleeding, no CMT or adnexal tenderness. Pelvic ultrasound obtained for further evaluation into vaginal bleeding, no acute findings. Wet prep positive for clue cells. GC/Chlamydia pending. Labs without acute finding. Urinalysis negative for infection. Regarding need for psychiatric evaluation, TTS consult appreciated. She does not meet inpatient criteria. No  SI/HI. She signed a no harm safety contract and was given resources for outpatient medication management and will be seen earlier than her scheduled appointment on the 28th with PCP. Stable for discharge. Return precautions given. Patient states understanding of treatment care plan and is agreeable.  Kathrynn Speed, PA-C 10/31/14 2223  Glynn Octave, MD 10/31/14 [phone]

## 2014-11-03 LAB — GC/CHLAMYDIA PROBE AMP (~~LOC~~) NOT AT ARMC
CHLAMYDIA, DNA PROBE: NEGATIVE
Neisseria Gonorrhea: NEGATIVE

## 2015-05-24 ENCOUNTER — Encounter (HOSPITAL_COMMUNITY): Payer: Self-pay | Admitting: Emergency Medicine

## 2015-05-24 ENCOUNTER — Emergency Department (HOSPITAL_COMMUNITY): Payer: Self-pay

## 2015-05-24 ENCOUNTER — Emergency Department (HOSPITAL_COMMUNITY)
Admission: EM | Admit: 2015-05-24 | Discharge: 2015-05-24 | Disposition: A | Discharge status: Home / Self Care | Payer: Self-pay | Attending: Emergency Medicine | Admitting: Emergency Medicine

## 2015-05-24 DIAGNOSIS — R42 Dizziness and giddiness: Secondary | ICD-10-CM | POA: Insufficient documentation

## 2015-05-24 DIAGNOSIS — Z872 Personal history of diseases of the skin and subcutaneous tissue: Secondary | ICD-10-CM | POA: Insufficient documentation

## 2015-05-24 DIAGNOSIS — R002 Palpitations: Secondary | ICD-10-CM | POA: Insufficient documentation

## 2015-05-24 DIAGNOSIS — R079 Chest pain, unspecified: Secondary | ICD-10-CM | POA: Insufficient documentation

## 2015-05-24 DIAGNOSIS — Z720 Tobacco use: Secondary | ICD-10-CM | POA: Insufficient documentation

## 2015-05-24 DIAGNOSIS — Z862 Personal history of diseases of the blood and blood-forming organs and certain disorders involving the immune mechanism: Secondary | ICD-10-CM | POA: Insufficient documentation

## 2015-05-24 DIAGNOSIS — Z8659 Personal history of other mental and behavioral disorders: Secondary | ICD-10-CM | POA: Insufficient documentation

## 2015-05-24 LAB — CBC
HCT: 33.7 % — ABNORMAL LOW (ref 36.0–46.0)
HEMOGLOBIN: 10.8 g/dL — AB (ref 12.0–15.0)
MCH: 23.6 pg — AB (ref 26.0–34.0)
MCHC: 32 g/dL (ref 30.0–36.0)
MCV: 73.6 fL — AB (ref 78.0–100.0)
Platelets: 253 10*3/uL (ref 150–400)
RBC: 4.58 MIL/uL (ref 3.87–5.11)
RDW: 15.2 % (ref 11.5–15.5)
WBC: 4.3 10*3/uL (ref 4.0–10.5)

## 2015-05-24 LAB — BASIC METABOLIC PANEL
ANION GAP: 3 — AB (ref 5–15)
BUN: 9 mg/dL (ref 6–20)
CHLORIDE: 108 mmol/L (ref 101–111)
CO2: 27 mmol/L (ref 22–32)
Calcium: 8.8 mg/dL — ABNORMAL LOW (ref 8.9–10.3)
Creatinine, Ser: 0.7 mg/dL (ref 0.44–1.00)
GFR calc non Af Amer: >60 mL/min (ref >60)
Glucose, Bld: 93 mg/dL (ref 65–99)
POTASSIUM: 3.9 mmol/L (ref 3.5–5.1)
SODIUM: 138 mmol/L (ref 135–145)

## 2015-05-24 MED ORDER — IBUPROFEN 800 MG PO TABS: 800.0000 mg | ORAL_TABLET | Freq: Three times a day (TID) | ORAL | Status: DC

## 2015-05-24 MED ORDER — IBUPROFEN 800 MG PO TABS
800.0000 mg | ORAL_TABLET | Freq: Once | ORAL | Status: AC
  Administered 2015-05-24: 800 mg via ORAL
  Filled 2015-05-24: qty 1

## 2015-05-24 NOTE — Discharge Instructions (Signed)

## 2015-05-24 NOTE — ED Notes (Signed)
Pt states that she has been having intermittent chest pain for past 3 weeks.  Pt states that at night pain is worse when she is trying to sleep. Pt states that she has been at work and feel panic and "feel like i am going to fall out".  Pt denies n/v or radiation of the pain.

## 2015-05-24 NOTE — ED Provider Notes (Signed)
CSN: [id number]     Arrival date & time 05/24/15  1458 History   First MD Initiated Contact with Patient 05/24/15 1629     Chief Complaint  Patient presents with  . Chest Pain     (Consider location/radiation/quality/duration/timing/severity/associated sxs/prior Treatment) HPI Comments: Patient presents to the ED with a chief complaint of chest pain.  She states that she has been having the symptoms for the past 3 weeks.  She states that the symptoms occur almost daily.  She states that occasionally she has associated heart palpitations and occasionally feels light headed.  She states that occasionally she feels SOB, like she "is going to have a panic attack." She is under a lot of stress with work and school everyday. She has never passed out.  She states that her chest is tender to palpation.  She denies any fever, chills, or cough.  Denies any history of ACS.  Denies any history of sudden cardiac death in her family.  She is an every day smoker. No history of PE or DVT, no recent surgery, no hemoptysis, no exogenous estrogen use, no unilateral leg swelling, no recent long travel.  She has tried taking BC powder with good relief.  The history is provided by the patient. No language interpreter was used.    Past Medical History  Diagnosis Date  . Abscess   . Depression   . Anemia    Past Surgical History  Procedure Laterality Date  . No past surgeries     Family History  Problem Relation Age of Onset  . Heart disease Mother   . Kidney disease Mother   . COPD Mother   . Cancer Mother   . Cancer Father   . COPD Father    Social History  Substance Use Topics  . Smoking status: Current Every Day Smoker  . Smokeless tobacco: None  . Alcohol Use: Yes     Comment: occasional   OB History    Gravida Para Term Preterm AB TAB SAB Ectopic Multiple Living   Review of Systems  Constitutional: Negative for fever and chills.  Respiratory: Negative for shortness of  breath.   Cardiovascular: Positive for chest pain and palpitations.  Gastrointestinal: Negative for nausea, vomiting, diarrhea and constipation.  Genitourinary: Negative for dysuria.  Neurological: Positive for light-headedness.  All other systems reviewed and are negative.     Allergies  Percocet and Vicodin  Home Medications   Prior to Admission medications   Not on File   BP 107/73 mmHg  Pulse 93  Temp(Src) 98.5 F (36.9 C) (Oral)  Resp 17  Ht  (1.6 m)  Wt 101 lb 4 oz (45.927 kg)  BMI 17.94 kg/m2  SpO2 99%  LMP 05/16/2015 Physical Exam  Constitutional: She is oriented to person, place, and time. She appears well-developed and well-nourished.  HENT:  Head: Normocephalic and atraumatic.  Eyes: Conjunctivae and EOM are normal. Pupils are equal, round, and reactive to light.  Neck: Normal range of motion. Neck supple.  Cardiovascular: Normal rate and regular rhythm.  Exam reveals no gallop and no friction rub.   No murmur heard. Pulmonary/Chest: Effort normal and breath sounds normal. No respiratory distress. She has no wheezes. She has no rales. She exhibits no tenderness.  CTAB  Abdominal: Soft. Bowel sounds are normal. She exhibits no distension and no mass. There is no tenderness. There is no rebound and no  guarding.  Musculoskeletal: Normal range of motion. She exhibits no edema or tenderness.  Neurological: She is alert and oriented to person, place, and time.  Skin: Skin is warm and dry.  Psychiatric: She has a normal mood and affect. Her behavior is normal. Judgment and thought content normal.  Nursing note and vitals reviewed.   ED Course  Procedures (including critical care time) Labs Review Labs Reviewed  CBC - Abnormal; Notable for the following:    Hemoglobin 10.8 (*)    HCT 33.7 (*)    MCV 73.6 (*)    MCH 23.6 (*)    All other components within normal limits  BASIC METABOLIC PANEL    Imaging Review Dg Chest 2 View  05/24/2015   CLINICAL  DATA:  Chest pain for 3 weeks.  EXAM: CHEST  2 VIEW  COMPARISON:  August 23, 2007.  FINDINGS: The heart size and mediastinal contours are within normal limits. Both lungs are clear. No pneumothorax or pleural effusion is noted. The visualized skeletal structures are unremarkable.  IMPRESSION: No active cardiopulmonary disease.   Electronically Signed   By: Lupita Raider, M.D.   On: 05/24/2015 15:59   I have personally reviewed and evaluated these images and lab results as part of my medical decision-making.   EKG Interpretation   Date/Time:  Sunday May 24 2015 15:08:46 EDT Ventricular Rate:  97 PR Interval:  159 QRS Duration: 81 QT Interval:  342 QTC Calculation: 434 R Axis:   69 Text Interpretation:  Sinus rhythm RSR' in V1 or V2, right VCD or RVH No  previous ECGs available Confirmed by ZACKOWSKI  MD, SCOTT [phone]) on  05/24/2015 3:12:45 PM      MDM   Final diagnoses:  Chest pain, unspecified chest pain type    Patient with chest pain x 3 weeks.  Low risk for ACS.  No hx of sudden cardiac death or early heart problems in family.  PERC negative.  Doubt PE.  Chest is ttp, could be chest wall pain or costochondritis.  Not worsened with movement.  No fevers, chills, cough.  Negative CXR.  Doubt infection.  VSS.  Will treat with ibuprofen.  Plan for discharge with cardiology follow-up.  Patient may benefit from wearing a holter monitor given occasional palpitations and lightheadedness.  Patient is otherwise well appearing.      Roxy Horseman, PA-C 05/24/15 1816  Alvira Monday, MD 05/26/15 1742

## 2015-09-02 ENCOUNTER — Encounter (HOSPITAL_COMMUNITY): Payer: Self-pay | Admitting: *Deleted

## 2015-09-02 ENCOUNTER — Inpatient Hospital Stay (HOSPITAL_COMMUNITY)
Admission: AD | Admit: 2015-09-02 | Discharge: 2015-09-02 | Disposition: A | Discharge status: Home / Self Care | Payer: Self-pay | Source: Ambulatory Visit | Attending: Obstetrics & Gynecology | Admitting: Obstetrics & Gynecology

## 2015-09-02 DIAGNOSIS — F17210 Nicotine dependence, cigarettes, uncomplicated: Secondary | ICD-10-CM | POA: Insufficient documentation

## 2015-09-02 DIAGNOSIS — N898 Other specified noninflammatory disorders of vagina: Secondary | ICD-10-CM | POA: Insufficient documentation

## 2015-09-02 DIAGNOSIS — F329 Major depressive disorder, single episode, unspecified: Secondary | ICD-10-CM | POA: Insufficient documentation

## 2015-09-02 DIAGNOSIS — A499 Bacterial infection, unspecified: Secondary | ICD-10-CM

## 2015-09-02 DIAGNOSIS — N3000 Acute cystitis without hematuria: Secondary | ICD-10-CM | POA: Insufficient documentation

## 2015-09-02 DIAGNOSIS — M545 Low back pain: Secondary | ICD-10-CM | POA: Insufficient documentation

## 2015-09-02 DIAGNOSIS — Z113 Encounter for screening for infections with a predominantly sexual mode of transmission: Secondary | ICD-10-CM | POA: Insufficient documentation

## 2015-09-02 DIAGNOSIS — B9689 Other specified bacterial agents as the cause of diseases classified elsewhere: Secondary | ICD-10-CM

## 2015-09-02 DIAGNOSIS — Z885 Allergy status to narcotic agent status: Secondary | ICD-10-CM | POA: Insufficient documentation

## 2015-09-02 DIAGNOSIS — N760 Acute vaginitis: Secondary | ICD-10-CM | POA: Insufficient documentation

## 2015-09-02 HISTORY — DX: Gonococcal infection, unspecified: A54.9

## 2015-09-02 HISTORY — DX: Chlamydial infection, unspecified: A74.9

## 2015-09-02 LAB — URINE MICROSCOPIC-ADD ON

## 2015-09-02 LAB — CBC
HCT: 33.7 % — ABNORMAL LOW (ref 36.0–46.0)
Hemoglobin: 11 g/dL — ABNORMAL LOW (ref 12.0–15.0)
MCH: 23.6 pg — ABNORMAL LOW (ref 26.0–34.0)
MCHC: 32.6 g/dL (ref 30.0–36.0)
MCV: 72.3 fL — ABNORMAL LOW (ref 78.0–100.0)
PLATELETS: 215 10*3/uL (ref 150–400)
RBC: 4.66 MIL/uL (ref 3.87–5.11)
RDW: 15.3 % (ref 11.5–15.5)
WBC: 4.4 10*3/uL (ref 4.0–10.5)

## 2015-09-02 LAB — WET PREP, GENITAL
Sperm: NONE SEEN
TRICH WET PREP: NONE SEEN
Yeast Wet Prep HPF POC: NONE SEEN

## 2015-09-02 LAB — URINALYSIS, ROUTINE W REFLEX MICROSCOPIC
BILIRUBIN URINE: NEGATIVE
Glucose, UA: NEGATIVE mg/dL
KETONES UR: 15 mg/dL — AB
NITRITE: POSITIVE — AB
PROTEIN: NEGATIVE mg/dL
Specific Gravity, Urine: 1.02 (ref 1.005–1.030)
pH: 6 (ref 5.0–8.0)

## 2015-09-02 LAB — POCT PREGNANCY, URINE: Preg Test, Ur: NEGATIVE

## 2015-09-02 LAB — HIV ANTIBODY (ROUTINE TESTING W REFLEX): HIV SCREEN 4TH GENERATION: NONREACTIVE

## 2015-09-02 MED ORDER — SULFAMETHOXAZOLE-TRIMETHOPRIM 800-160 MG PO TABS: 1.0000 | ORAL_TABLET | Freq: Two times a day (BID) | ORAL | Status: AC

## 2015-09-02 MED ORDER — METRONIDAZOLE 500 MG PO TABS: 500.0000 mg | ORAL_TABLET | Freq: Two times a day (BID) | ORAL | Status: DC

## 2015-09-02 NOTE — MAU Provider Note (Signed)
History     CSN: [id number]  Arrival date and time: 09/02/15 9147   First Provider Initiated Contact with Patient 09/02/15 [phone]         Chief Complaint  Patient presents with  . Urinary Frequency  . Vaginal Discharge   Sarah Fritz is a 35 y.o. female who presents for urinary frequency & vaginal discharge. Also desires STD testing.   Urinary Frequency  Episode onset: 2 weeks ago after taking a bubble bath. The problem occurs every urination. The problem has been unchanged. The patient is experiencing no pain. There has been no fever. She is sexually active. There is no history of pyelonephritis. Associated symptoms include a discharge, frequency and urgency. Pertinent negatives include no chills, flank pain, hematuria, nausea, possible pregnancy or vomiting. Treatments tried: AZO. The treatment provided no relief. Her past medical history is significant for recurrent UTIs (had 3 UTIs last year).  Female GU Problem The patient's primary symptoms include genital itching and vaginal discharge. The patient's pertinent negatives include no genital odor or vaginal bleeding. Episode onset: 2 days ago. The problem has been unchanged. The patient is experiencing no pain. She is not pregnant. Associated symptoms include back pain (dull low back pain), frequency and urgency. Pertinent negatives include no abdominal pain, chills, constipation, diarrhea, dysuria, fever, flank pain, hematuria, nausea, painful intercourse or vomiting. The vaginal discharge was thick and white. There has been no bleeding. She is sexually active. It is unknown (desires STD testing) whether or not her partner has an STD. She uses nothing for contraception. Her past medical history is significant for an STD.    OB History    Gravida Para Term Preterm AB TAB SAB Ectopic Multiple Living   Past Medical History  Diagnosis Date  . Abscess   . Depression   . Anemia   . Chlamydia   . Gonorrhea      Past Surgical History  Procedure Laterality Date  . Wisdom tooth extraction      Family History  Problem Relation Age of Onset  . Heart disease Mother   . Kidney disease Mother   . COPD Mother   . Cancer Mother   . Cancer Father   . COPD Father     Social History  Substance Use Topics  . Smoking status: Current Every Day Smoker -- 1.50 packs/day    Types: Cigarettes  . Smokeless tobacco: None  . Alcohol Use: No     Comment: occasional    Allergies:  Allergies  Allergen Reactions  . Percocet [Oxycodone-Acetaminophen] Nausea And Vomiting and Other (See Comments)    Sweating, pass out  . Vicodin [Hydrocodone-Acetaminophen] Nausea And Vomiting and Other (See Comments)    Sweating, pass out    Prescriptions prior to admission  Medication Sig Dispense Refill Last Dose  . ibuprofen (ADVIL,MOTRIN) 800 MG tablet Take 1 tablet (800 mg total) by mouth 3 (three) times daily. 21 tablet 0     Review of Systems  Constitutional: Negative.  Negative for fever and chills.  Gastrointestinal: Negative.  Negative for nausea, vomiting, abdominal pain, diarrhea and constipation.  Genitourinary: Positive for urgency, frequency and vaginal discharge. Negative for dysuria, hematuria and flank pain.       + vaginal discharge  Musculoskeletal: Positive for back pain (dull low back pain).   Physical Exam   Blood pressure 115/83, pulse 84, temperature 98.4 F (36.9 C), temperature  source Oral, resp. rate 16, last menstrual period 08/10/2015.  Physical Exam  Nursing note and vitals reviewed. Constitutional: She is oriented to person, place, and time. She appears well-developed and well-nourished. No distress.  HENT:  Head: Normocephalic and atraumatic.  Eyes: Conjunctivae are normal. Right eye exhibits no discharge. Left eye exhibits no discharge. No scleral icterus.  Neck: Normal range of motion.  Cardiovascular: Normal rate, regular rhythm and normal heart sounds.   No murmur  heard. Respiratory: Effort normal and breath sounds normal. No respiratory distress. She has no wheezes.  GI: Soft. Bowel sounds are normal. She exhibits no distension. There is no tenderness. There is no CVA tenderness.  Genitourinary: Vagina normal. Cervix exhibits discharge (small amount of thick white discharge). Cervix exhibits no motion tenderness and no friability.  Neurological: She is alert and oriented to person, place, and time.  Skin: Skin is warm and dry. She is not diaphoretic.  Psychiatric: She has a normal mood and affect. Her behavior is normal. Judgment and thought content normal.    MAU Course  Procedures Results for orders placed or performed during the hospital encounter of 09/02/15 (from the past 24 hour(s))  Urinalysis, Routine w reflex microscopic (not at [facility])     Status: Abnormal   Collection Time: 09/02/15  8:23 AM  Result Value Ref Range   Color, Urine YELLOW YELLOW   APPearance CLEAR CLEAR   Specific Gravity, Urine 1.020 1.005 - 1.030   pH 6.0 5.0 - 8.0   Glucose, UA NEGATIVE NEGATIVE mg/dL   Hgb urine dipstick TRACE (A) NEGATIVE   Bilirubin Urine NEGATIVE NEGATIVE   Ketones, ur 15 (A) NEGATIVE mg/dL   Protein, ur NEGATIVE NEGATIVE mg/dL   Nitrite POSITIVE (A) NEGATIVE   Leukocytes, UA SMALL (A) NEGATIVE  Urine microscopic-add on     Status: Abnormal   Collection Time: 09/02/15  8:23 AM  Result Value Ref Range   Squamous Epithelial / LPF 0-5 (A) NONE SEEN   WBC, UA 6-30 0 - 5 WBC/hpf   RBC / HPF 0-5 0 - 5 RBC/hpf   Bacteria, UA FEW (A) NONE SEEN   Urine-Other MUCOUS PRESENT   Pregnancy, urine POC     Status: None   Collection Time: 09/02/15  8:30 AM  Result Value Ref Range   Preg Test, Ur NEGATIVE NEGATIVE  CBC     Status: Abnormal   Collection Time: 09/02/15  8:52 AM  Result Value Ref Range   WBC 4.4 4.0 - 10.5 K/uL   RBC 4.66 3.87 - 5.11 MIL/uL   Hemoglobin 11.0 (L) 12.0 - 15.0 g/dL   HCT 35.0 (L) 09.3 - 81.8 %   MCV 72.3 (L) 78.0 - 100.0 fL    MCH 23.6 (L) 26.0 - 34.0 pg   MCHC 32.6 30.0 - 36.0 g/dL   RDW 29.9 37.1 - 69.6 %   Platelets 215 150 - 400 K/uL  Wet prep, genital     Status: Abnormal   Collection Time: 09/02/15  9:32 AM  Result Value Ref Range   Yeast Wet Prep HPF POC NONE SEEN NONE SEEN   Trich, Wet Prep NONE SEEN NONE SEEN   Clue Cells Wet Prep HPF POC PRESENT (A) NONE SEEN   WBC, Wet Prep HPF POC MANY (A) NONE SEEN   Sperm NONE SEEN     MDM UPT negative Will tx for UTI - urine culture sent CBC, GC/CT, wet prep, HIV, RPR Assessment and Plan   1. Screen for STD (  sexually transmitted disease)   2. BV (bacterial vaginosis)   3. Acute cystitis without hematuria    P: Discharge home Rx septra & flagyl GC/Ct, HIV, RPR pending Urine culture pending  Judeth Horn, NP  09/02/2015, 8:38 AM

## 2015-09-02 NOTE — MAU Note (Signed)
Pt C/O pelvic pressure & urinary frequency for 1 1/2 weeks,  has been taken AZO.  Now having back pain x 2 days & white vaginal discharge.  Denies dysuria.

## 2015-09-02 NOTE — Discharge Instructions (Signed)
Bacterial Vaginosis °Bacterial vaginosis is a vaginal infection that occurs when the normal balance of bacteria in the vagina is disrupted. It results from an overgrowth of certain bacteria. This is the most common vaginal infection in women of childbearing age. Treatment is important to prevent complications, especially in pregnant women, as it can cause a premature delivery. °CAUSES  °Bacterial vaginosis is caused by an increase in harmful bacteria that are normally present in smaller amounts in the vagina. Several different kinds of bacteria can cause bacterial vaginosis. However, the reason that the condition develops is not fully understood. °RISK FACTORS °Certain activities or behaviors can put you at an increased risk of developing bacterial vaginosis, including: °· Having a new sex partner or multiple sex partners. °· Douching. °· Using an intrauterine device (IUD) for contraception. °Women do not get bacterial vaginosis from toilet seats, bedding, swimming pools, or contact with objects around them. °SIGNS AND SYMPTOMS  °Some women with bacterial vaginosis have no signs or symptoms. Common symptoms include: °· Grey vaginal discharge. °· A fishlike odor with discharge, especially after sexual intercourse. °· Itching or burning of the vagina and vulva. °· Burning or pain with urination. °DIAGNOSIS  °Your health care provider will take a medical history and examine the vagina for signs of bacterial vaginosis. A sample of vaginal fluid may be taken. Your health care provider will look at this sample under a microscope to check for bacteria and abnormal cells. A vaginal pH test may also be done.  °TREATMENT  °Bacterial vaginosis may be treated with antibiotic medicines. These may be given in the form of a pill or a vaginal cream. A second round of antibiotics may be prescribed if the condition comes back after treatment. Because bacterial vaginosis increases your risk for sexually transmitted diseases, getting  treated can help reduce your risk for chlamydia, gonorrhea, HIV, and herpes. °HOME CARE INSTRUCTIONS  °· Only take over-the-counter or prescription medicines as directed by your health care provider. °· If antibiotic medicine was prescribed, take it as directed. Make sure you finish it even if you start to feel better. °· Tell all sexual partners that you have a vaginal infection. They should see their health care provider and be treated if they have problems, such as a mild rash or itching. °· During treatment, it is important that you follow these instructions: °· Avoid sexual activity or use condoms correctly. °· Do not douche. °· Avoid alcohol as directed by your health care provider. °· Avoid breastfeeding as directed by your health care provider. °SEEK MEDICAL CARE IF:  °· Your symptoms are not improving after 3 days of treatment. °· You have increased discharge or pain. °· You have a fever. °MAKE SURE YOU:  °· Understand these instructions. °· Will watch your condition. °· Will get help right away if you are not doing well or get worse. °FOR MORE INFORMATION  °Centers for Disease Control and Prevention, Division of STD Prevention: www.cdc.gov/std °American Sexual Health Association (ASHA): www.ashastd.org  °  °This information is not intended to replace advice given to you by your health care provider. Make sure you discuss any questions you have with your health care provider. °  °Document Released: 08/01/2005 Document Revised: 08/22/2014 Document Reviewed: 03/13/2013 °Elsevier Interactive Patient Education ©2016 [employer] ° °Urinary Tract Infection °Urinary tract infections (UTIs) can develop anywhere along your urinary tract. Your urinary tract is your body's drainage system for removing wastes and extra water. Your urinary tract includes two kidneys, two ureters,   a bladder, and a urethra. Your kidneys are a pair of bean-shaped organs. Each kidney is about the size of your fist. They are located below  your ribs, one on each side of your spine. °CAUSES °Infections are caused by microbes, which are microscopic organisms, including fungi, viruses, and bacteria. These organisms are so small that they can only be seen through a microscope. Bacteria are the microbes that most commonly cause UTIs. °SYMPTOMS  °Symptoms of UTIs may vary by age and gender of the patient and by the location of the infection. Symptoms in young women typically include a frequent and intense urge to urinate and a painful, burning feeling in the bladder or urethra during urination. Older women and men are more likely to be tired, shaky, and weak and have muscle aches and abdominal pain. A fever may mean the infection is in your kidneys. Other symptoms of a kidney infection include pain in your back or sides below the ribs, nausea, and vomiting. °DIAGNOSIS °To diagnose a UTI, your caregiver will ask you about your symptoms. Your caregiver will also ask you to provide a urine sample. The urine sample will be tested for bacteria and white blood cells. White blood cells are made by your body to help fight infection. °TREATMENT  °Typically, UTIs can be treated with medication. Because most UTIs are caused by a bacterial infection, they usually can be treated with the use of antibiotics. The choice of antibiotic and length of treatment depend on your symptoms and the type of bacteria causing your infection. °HOME CARE INSTRUCTIONS °· If you were prescribed antibiotics, take them exactly as your caregiver instructs you. Finish the medication even if you feel better after you have only taken some of the medication. °· Drink enough water and fluids to keep your urine clear or pale yellow. °· Avoid caffeine, tea, and carbonated beverages. They tend to irritate your bladder. °· Empty your bladder often. Avoid holding urine for long periods of time. °· Empty your bladder before and after sexual intercourse. °· After a bowel movement, women should cleanse  from front to back. Use each tissue only once. °SEEK MEDICAL CARE IF:  °· You have back pain. °· You develop a fever. °· Your symptoms do not begin to resolve within 3 days. °SEEK IMMEDIATE MEDICAL CARE IF:  °· You have severe back pain or lower abdominal pain. °· You develop chills. °· You have nausea or vomiting. °· You have continued burning or discomfort with urination. °MAKE SURE YOU:  °· Understand these instructions. °· Will watch your condition. °· Will get help right away if you are not doing well or get worse. °  °This information is not intended to replace advice given to you by your health care provider. Make sure you discuss any questions you have with your health care provider. °  °Document Released: 05/11/2005 Document Revised: 04/22/2015 Document Reviewed: 09/09/2011 °Elsevier Interactive Patient Education ©2016 [employer] Inc. ° °

## 2015-09-03 LAB — RPR: RPR Ser Ql: NONREACTIVE

## 2015-09-03 LAB — GC/CHLAMYDIA PROBE AMP (~~LOC~~) NOT AT ARMC
Chlamydia: NEGATIVE
NEISSERIA GONORRHEA: NEGATIVE

## 2015-09-04 LAB — CULTURE, OB URINE

## 2016-01-02 ENCOUNTER — Inpatient Hospital Stay (HOSPITAL_COMMUNITY)
Admission: AD | Admit: 2016-01-02 | Discharge: 2016-01-02 | Disposition: A | Discharge status: Home / Self Care | Payer: Medicaid Other | Source: Ambulatory Visit | Attending: Obstetrics & Gynecology | Admitting: Obstetrics & Gynecology

## 2016-01-02 ENCOUNTER — Encounter (HOSPITAL_COMMUNITY): Payer: Self-pay | Admitting: *Deleted

## 2016-01-02 DIAGNOSIS — Z885 Allergy status to narcotic agent status: Secondary | ICD-10-CM | POA: Insufficient documentation

## 2016-01-02 DIAGNOSIS — Z825 Family history of asthma and other chronic lower respiratory diseases: Secondary | ICD-10-CM | POA: Insufficient documentation

## 2016-01-02 DIAGNOSIS — N760 Acute vaginitis: Secondary | ICD-10-CM

## 2016-01-02 DIAGNOSIS — F17210 Nicotine dependence, cigarettes, uncomplicated: Secondary | ICD-10-CM | POA: Insufficient documentation

## 2016-01-02 DIAGNOSIS — A499 Bacterial infection, unspecified: Secondary | ICD-10-CM

## 2016-01-02 DIAGNOSIS — N390 Urinary tract infection, site not specified: Secondary | ICD-10-CM | POA: Diagnosis not present

## 2016-01-02 DIAGNOSIS — Z841 Family history of disorders of kidney and ureter: Secondary | ICD-10-CM | POA: Insufficient documentation

## 2016-01-02 DIAGNOSIS — B9689 Other specified bacterial agents as the cause of diseases classified elsewhere: Secondary | ICD-10-CM

## 2016-01-02 DIAGNOSIS — Z8249 Family history of ischemic heart disease and other diseases of the circulatory system: Secondary | ICD-10-CM | POA: Insufficient documentation

## 2016-01-02 DIAGNOSIS — N3001 Acute cystitis with hematuria: Secondary | ICD-10-CM

## 2016-01-02 DIAGNOSIS — R300 Dysuria: Secondary | ICD-10-CM | POA: Diagnosis present

## 2016-01-02 LAB — URINALYSIS, ROUTINE W REFLEX MICROSCOPIC
GLUCOSE, UA: 250 mg/dL — AB
Ketones, ur: >80 mg/dL — AB
Nitrite: POSITIVE — AB
Protein, ur: >300 mg/dL — AB
pH: 5 (ref 5.0–8.0)

## 2016-01-02 LAB — WET PREP, GENITAL
SPERM: NONE SEEN
Trich, Wet Prep: NONE SEEN
Yeast Wet Prep HPF POC: NONE SEEN

## 2016-01-02 LAB — URINE MICROSCOPIC-ADD ON

## 2016-01-02 MED ORDER — METRONIDAZOLE 500 MG PO TABS
500.0000 mg | ORAL_TABLET | Freq: Two times a day (BID) | ORAL | Status: DC
Start: 2016-01-02 — End: 2016-05-23

## 2016-01-02 MED ORDER — CIPROFLOXACIN HCL 250 MG PO TABS: 250.0000 mg | ORAL_TABLET | Freq: Two times a day (BID) | ORAL | Status: DC

## 2016-01-02 NOTE — MAU Note (Signed)
Pt reports painful pelvic pressure and painful urination. She thinks she has a UTI or STD.

## 2016-01-02 NOTE — MAU Provider Note (Signed)
History     CSN: [id number]  Arrival date and time: 01/02/16 [id number]   First Provider Initiated Contact with Patient 01/02/16 2018      Chief Complaint  Patient presents with  . Urinary Tract Infection   HPI Ms. Sarah Fritz is a 35 y.o. G1P1001 who presents to MAU today with complaint of dysuria, urgency, frequency and clear, watery discharge x 2 weeks. She has been taking AZO tabs with minimal relief. She denies abdominal pain, fever, flank pain or vaginal bleeding.   OB History    Gravida Para Term Preterm AB TAB SAB Ectopic Multiple Living   1 1 1       1       Past Medical History  Diagnosis Date  . Abscess   . Depression   . Anemia   . Chlamydia   . Gonorrhea     Past Surgical History  Procedure Laterality Date  . Wisdom tooth extraction      Family History  Problem Relation Age of Onset  . Heart disease Mother   . Kidney disease Mother   . COPD Mother   . Cancer Mother   . Cancer Father   . COPD Father     Social History  Substance Use Topics  . Smoking status: Current Every Day Smoker -- 1.50 packs/day    Types: Cigarettes  . Smokeless tobacco: Not on file  . Alcohol Use: No     Comment: occasional    Allergies:  Allergies  Allergen Reactions  . Percocet [Oxycodone-Acetaminophen] Nausea And Vomiting and Other (See Comments)    Sweating, pass out  . Vicodin [Hydrocodone-Acetaminophen] Nausea And Vomiting and Other (See Comments)    Sweating, pass out    Prescriptions prior to admission  Medication Sig Dispense Refill Last Dose  . ibuprofen (ADVIL,MOTRIN) 800 MG tablet Take 1 tablet (800 mg total) by mouth 3 (three) times daily. (Patient not taking: Reported on 09/02/2015) 21 tablet 0   . phenazopyridine (PYRIDIUM) 95 MG tablet Take 95 mg by mouth 3 (three) times daily as needed for pain.   08/31/2015  . [DISCONTINUED] metroNIDAZOLE (FLAGYL) 500 MG tablet Take 1 tablet (500 mg total) by mouth 2 (two) times daily. 14 tablet 0     Review of  Systems  Constitutional: Negative for fever and malaise/fatigue.  Gastrointestinal: Negative for nausea, vomiting, abdominal pain, diarrhea and constipation.  Genitourinary: Positive for dysuria, urgency, frequency and hematuria. Negative for flank pain.       + vaginal discharge ? Vaginal bleeding   Physical Exam   Blood pressure 122/95, temperature 98.5 F (36.9 C), temperature source Oral, resp. rate 18, height 5\' 4"  (1.626 m), weight 97 lb (43.999 kg).  Physical Exam  Nursing note and vitals reviewed. Constitutional: She is oriented to person, place, and time. She appears well-developed and well-nourished. No distress.  HENT:  Head: Normocephalic and atraumatic.  Cardiovascular: Normal rate.   Respiratory: Effort normal.  GI: Soft. She exhibits no distension and no mass. There is tenderness (mild suprapubic tenderness to palpation at midline). There is no rebound, no guarding and no CVA tenderness.  Genitourinary: Uterus is tender (mild). Uterus is not enlarged. Cervix exhibits no motion tenderness, no discharge and no friability. Right adnexum displays no mass and no tenderness. Left adnexum displays no mass and no tenderness. No bleeding in the vagina. Vaginal discharge (small amount of thin, white discharge noted with mild odor) found.  Neurological: She is alert and oriented to person, place,  and time.  Skin: Skin is warm and dry. No erythema.  Psychiatric: She has a normal mood and affect.    Results for orders placed or performed during the hospital encounter of 01/02/16 (from the past 24 hour(s))  Urinalysis, Routine w reflex microscopic (not at [facility]     Status: Abnormal   Collection Time: 01/02/16  8:00 PM  Result Value Ref Range   Color, Urine ORANGE (A) YELLOW   APPearance HAZY (A) CLEAR   Specific Gravity, Urine >1.030 (H) 1.005 - 1.030   pH 5.0 5.0 - 8.0   Glucose, UA 250 (A) NEGATIVE mg/dL   Hgb urine dipstick LARGE (A) NEGATIVE   Bilirubin Urine MODERATE (A)  NEGATIVE   Ketones, ur >80 (A) NEGATIVE mg/dL   Protein, ur >409 (A) NEGATIVE mg/dL   Nitrite POSITIVE (A) NEGATIVE   Leukocytes, UA MODERATE (A) NEGATIVE  Urine microscopic-add on     Status: Abnormal   Collection Time: 01/02/16  8:00 PM  Result Value Ref Range   Squamous Epithelial / LPF 6-30 (A) NONE SEEN   WBC, UA TOO NUMEROUS TO COUNT 0 - 5 WBC/hpf   RBC / HPF TOO NUMEROUS TO COUNT 0 - 5 RBC/hpf   Bacteria, UA MANY (A) NONE SEEN  Wet prep, genital     Status: Abnormal   Collection Time: 01/02/16  8:30 PM  Result Value Ref Range   Yeast Wet Prep HPF POC NONE SEEN NONE SEEN   Trich, Wet Prep NONE SEEN NONE SEEN   Clue Cells Wet Prep HPF POC PRESENT (A) NONE SEEN   WBC, Wet Prep HPF POC MODERATE (A) NONE SEEN   Sperm NONE SEEN     MAU Course  Procedures None  MDM UPT - negative UA, wet prep, GC/Chlamydia, RPR and HIV today  Urine culture ordered  Assessment and Plan  A: UTI, acute Bacterial vaginosis  P: Discharge home Rx for Cipro and Flagyl given to patient  Warning signs for worsening condition discussed Discussed hygiene products to avoid for recurrent BV Urine culture pending Patient advised to follow-up with GYN of choice PRN Patient may return to MAU as needed or if her condition were to change or worsen   Marny Lowenstein, PA-C  01/02/2016, 8:57 PM

## 2016-01-02 NOTE — Discharge Instructions (Signed)
Urinary Tract Infection Urinary tract infections (UTIs) can develop anywhere along your urinary tract. Your urinary tract is your body's drainage system for removing wastes and extra water. Your urinary tract includes two kidneys, two ureters, a bladder, and a urethra. Your kidneys are a pair of bean-shaped organs. Each kidney is about the size of your fist. They are located below your ribs, one on each side of your spine. CAUSES Infections are caused by microbes, which are microscopic organisms, including fungi, viruses, and bacteria. These organisms are so small that they can only be seen through a microscope. Bacteria are the microbes that most commonly cause UTIs. SYMPTOMS  Symptoms of UTIs may vary by age and gender of the patient and by the location of the infection. Symptoms in young women typically include a frequent and intense urge to urinate and a painful, burning feeling in the bladder or urethra during urination. Older women and men are more likely to be tired, shaky, and weak and have muscle aches and abdominal pain. A fever may mean the infection is in your kidneys. Other symptoms of a kidney infection include pain in your back or sides below the ribs, nausea, and vomiting. DIAGNOSIS To diagnose a UTI, your caregiver will ask you about your symptoms. Your caregiver will also ask you to provide a urine sample. The urine sample will be tested for bacteria and white blood cells. White blood cells are made by your body to help fight infection. TREATMENT  Typically, UTIs can be treated with medication. Because most UTIs are caused by a bacterial infection, they usually can be treated with the use of antibiotics. The choice of antibiotic and length of treatment depend on your symptoms and the type of bacteria causing your infection. HOME CARE INSTRUCTIONS  If you were prescribed antibiotics, take them exactly as your caregiver instructs you. Finish the medication even if you feel better after  you have only taken some of the medication.  Drink enough water and fluids to keep your urine clear or pale yellow.  Avoid caffeine, tea, and carbonated beverages. They tend to irritate your bladder.  Empty your bladder often. Avoid holding urine for long periods of time.  Empty your bladder before and after sexual intercourse.  After a bowel movement, women should cleanse from front to back. Use each tissue only once. SEEK MEDICAL CARE IF:   You have back pain.  You develop a fever.  Your symptoms do not begin to resolve within 3 days. SEEK IMMEDIATE MEDICAL CARE IF:   You have severe back pain or lower abdominal pain.  You develop chills.  You have nausea or vomiting.  You have continued burning or discomfort with urination. MAKE SURE YOU:   Understand these instructions.  Will watch your condition.  Will get help right away if you are not doing well or get worse.   This information is not intended to replace advice given to you by your health care provider. Make sure you discuss any questions you have with your health care provider.   Document Released: 05/11/2005 Document Revised: 04/22/2015 Document Reviewed: 09/09/2011 Elsevier Interactive Patient Education 2016 Else[employer] Inc.  Bacterial Vaginosis Bacterial vaginosis is an infection of the vagina. It happens when too many germs (bacteria) grow in the vagina. Having this infection puts you at risk for getting other infections from sex. Treating this infection can help lower your risk for other infections, such as:   Chlamydia.  Gonorrhea.  HIV.  Herpes. HOME CARE  Take  your medicine as told by your doctor.  Finish your medicine even if you start to feel better.  Tell your sex partner that you have an infection. They should see their doctor for treatment.  During treatment:  Avoid sex or use condoms correctly.  Do not douche.  Do not drink alcohol unless your doctor tells you it is ok.  Do not  breastfeed unless your doctor tells you it is ok. GET HELP IF:  You are not getting better after 3 days of treatment.  You have more grey fluid (discharge) coming from your vagina than before.  You have more pain than before.  You have a fever. MAKE SURE YOU:   Understand these instructions.  Will watch your condition.  Will get help right away if you are not doing well or get worse.   This information is not intended to replace advice given to you by your health care provider. Make sure you discuss any questions you have with your health care provider.   Document Released: 05/10/2008 Document Revised: 08/22/2014 Document Reviewed: 03/13/2013 Elsevier Interactive Patient Education [employer]

## 2016-01-03 LAB — HIV ANTIBODY (ROUTINE TESTING W REFLEX): HIV SCREEN 4TH GENERATION: NONREACTIVE

## 2016-01-03 LAB — RPR: RPR: NONREACTIVE

## 2016-01-04 LAB — POCT PREGNANCY, URINE: PREG TEST UR: NEGATIVE

## 2016-01-04 LAB — URINE CULTURE

## 2016-01-04 LAB — GC/CHLAMYDIA PROBE AMP (~~LOC~~) NOT AT ARMC
CHLAMYDIA, DNA PROBE: NEGATIVE
NEISSERIA GONORRHEA: NEGATIVE

## 2016-02-17 ENCOUNTER — Emergency Department (HOSPITAL_COMMUNITY)
Admission: EM | Admit: 2016-02-17 | Discharge: 2016-02-17 | Disposition: A | Discharge status: Home / Self Care | Payer: Medicaid Other | Attending: Emergency Medicine | Admitting: Emergency Medicine

## 2016-02-17 ENCOUNTER — Encounter (HOSPITAL_COMMUNITY): Payer: Self-pay | Admitting: Emergency Medicine

## 2016-02-17 DIAGNOSIS — R51 Headache: Secondary | ICD-10-CM | POA: Diagnosis not present

## 2016-02-17 DIAGNOSIS — Y999 Unspecified external cause status: Secondary | ICD-10-CM | POA: Diagnosis not present

## 2016-02-17 DIAGNOSIS — F17210 Nicotine dependence, cigarettes, uncomplicated: Secondary | ICD-10-CM | POA: Diagnosis not present

## 2016-02-17 DIAGNOSIS — Y929 Unspecified place or not applicable: Secondary | ICD-10-CM | POA: Diagnosis not present

## 2016-02-17 DIAGNOSIS — F329 Major depressive disorder, single episode, unspecified: Secondary | ICD-10-CM | POA: Insufficient documentation

## 2016-02-17 DIAGNOSIS — Z7982 Long term (current) use of aspirin: Secondary | ICD-10-CM | POA: Insufficient documentation

## 2016-02-17 DIAGNOSIS — R519 Headache, unspecified: Secondary | ICD-10-CM

## 2016-02-17 DIAGNOSIS — S0990XA Unspecified injury of head, initial encounter: Secondary | ICD-10-CM | POA: Diagnosis present

## 2016-02-17 DIAGNOSIS — Y939 Activity, unspecified: Secondary | ICD-10-CM | POA: Diagnosis not present

## 2016-02-17 MED ORDER — DIPHENHYDRAMINE HCL 50 MG/ML IJ SOLN
25.0000 mg | Freq: Once | INTRAMUSCULAR | Status: AC
  Administered 2016-02-17: 25 mg via INTRAVENOUS
  Filled 2016-02-17: qty 1

## 2016-02-17 MED ORDER — METOCLOPRAMIDE HCL 5 MG/ML IJ SOLN
10.0000 mg | Freq: Once | INTRAMUSCULAR | Status: AC
  Administered 2016-02-17: 10 mg via INTRAVENOUS
  Filled 2016-02-17: qty 2

## 2016-02-17 MED ORDER — KETOROLAC TROMETHAMINE 30 MG/ML IJ SOLN
30.0000 mg | Freq: Once | INTRAMUSCULAR | Status: AC
  Administered 2016-02-17: 30 mg via INTRAVENOUS
  Filled 2016-02-17: qty 1
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
## 2016-02-17 NOTE — ED Notes (Signed)
Provider at bedside

## 2016-05-21 ENCOUNTER — Emergency Department (HOSPITAL_COMMUNITY)
Admission: EM | Admit: 2016-05-21 | Discharge: 2016-05-21 | Disposition: A | Discharge status: Home / Self Care | Payer: Medicaid Other | Attending: Emergency Medicine | Admitting: Emergency Medicine

## 2016-05-21 ENCOUNTER — Encounter (HOSPITAL_COMMUNITY): Payer: Self-pay

## 2016-05-21 DIAGNOSIS — Y939 Activity, unspecified: Secondary | ICD-10-CM | POA: Diagnosis not present

## 2016-05-21 DIAGNOSIS — F17210 Nicotine dependence, cigarettes, uncomplicated: Secondary | ICD-10-CM | POA: Insufficient documentation

## 2016-05-21 DIAGNOSIS — M7989 Other specified soft tissue disorders: Secondary | ICD-10-CM | POA: Diagnosis not present

## 2016-05-21 DIAGNOSIS — Z7982 Long term (current) use of aspirin: Secondary | ICD-10-CM | POA: Diagnosis not present

## 2016-05-21 DIAGNOSIS — X501XXA Overexertion from prolonged static or awkward postures, initial encounter: Secondary | ICD-10-CM | POA: Diagnosis not present

## 2016-05-21 DIAGNOSIS — Y990 Civilian activity done for income or pay: Secondary | ICD-10-CM | POA: Insufficient documentation

## 2016-05-21 DIAGNOSIS — R51 Headache: Secondary | ICD-10-CM | POA: Insufficient documentation

## 2016-05-21 DIAGNOSIS — Y929 Unspecified place or not applicable: Secondary | ICD-10-CM | POA: Diagnosis not present

## 2016-05-21 LAB — I-STAT CHEM 8, ED
BUN: 9 mg/dL (ref 6–20)
CALCIUM ION: 1.19 mmol/L (ref 1.15–1.40)
CHLORIDE: 106 mmol/L (ref 101–111)
Creatinine, Ser: 0.5 mg/dL (ref 0.44–1.00)
Glucose, Bld: 105 mg/dL — ABNORMAL HIGH (ref 65–99)
HEMATOCRIT: 30 % — AB (ref 36.0–46.0)
Hemoglobin: 10.2 g/dL — ABNORMAL LOW (ref 12.0–15.0)
Potassium: 3.6 mmol/L (ref 3.5–5.1)
SODIUM: 141 mmol/L (ref 135–145)
TCO2: 23 mmol/L (ref 0–100)

## 2016-05-21 NOTE — ED Provider Notes (Signed)
[facility]-EMERGENCY DEPT Provider Note   CSN: [id number] Arrival date & time: 05/21/16  2141     History   Chief Complaint Chief Complaint  Patient presents with  . Foot Swelling    BILATERAL    HPI Sarah Fritz is a 35 y.o. female.Complains of bilateral foot swelling for the past week since starting a new job in which she spends a lot of time standing. She denies foot pain. She also complains of intermittent headaches for the past week only since she started the new job. She denies headache presently. No treatment prior to coming here. Foot swelling is worse with prolonged standing and worse at night not improved with anything. No chest pain no shortness of breath. No other associated symptoms  HPI  Past Medical History:  Diagnosis Date  . Abscess   . Anemia   . Chlamydia   . Depression   . Gonorrhea     Patient Active Problem List   Diagnosis Date Noted  . Chlamydia 04/18/2013  . Recurrent UTI 04/18/2013    Past Surgical History:  Procedure Laterality Date  . WISDOM TOOTH EXTRACTION      OB History    Gravida Para Term Preterm AB Living   1 1 1     1    SAB TAB Ectopic Multiple Live Births                   Home Medications    Prior to Admission medications   Medication Sig Start Date End Date Taking? Authorizing Provider  Aspirin-Salicylamide-Caffeine (BC HEADACHE POWDER PO) Take 1 Package by mouth daily as needed (headache).   Yes Historical Provider, MD  ciprofloxacin (CIPRO) 250 MG tablet Take 1 tablet (250 mg total) by mouth every 12 (twelve) hours. Patient not taking: Reported on 05/21/2016 01/02/16   Marny Lowenstein, PA-C  ibuprofen (ADVIL,MOTRIN) 800 MG tablet Take 1 tablet (800 mg total) by mouth 3 (three) times daily. Patient not taking: Reported on 05/21/2016 05/24/15   Roxy Horseman, PA-C  metroNIDAZOLE (FLAGYL) 500 MG tablet Take 1 tablet (500 mg total) by mouth 2 (two) times daily. Patient not taking: Reported on 05/21/2016 01/02/16   Marny Lowenstein, PA-C    Family History Family History  Problem Relation Age of Onset  . Heart disease Mother   . Kidney disease Mother   . COPD Mother   . Cancer Mother   . Cancer Father   . COPD Father     Social History Social History  Substance Use Topics  . Smoking status: Current Every Day Smoker    Packs/day: 1.50    Types: Cigarettes  . Smokeless tobacco: Never Used  . Alcohol use No     Comment: occasional  Positive marijuana use   Allergies   Percocet [oxycodone-acetaminophen] and Vicodin [hydrocodone-acetaminophen]   Review of Systems Review of Systems  Constitutional: Negative.   Respiratory: Negative.   Cardiovascular:       Bilateral foot swelling  Gastrointestinal: Negative.   Musculoskeletal: Negative.   Skin: Negative.   Neurological: Positive for headaches.  Psychiatric/Behavioral: Negative.   All other systems reviewed and are negative.    Physical Exam Updated Vital Signs BP 145/80 (BP Location: Left Arm)   Pulse 108   Temp 98.2 F (36.8 C) (Oral)   Resp 18   Ht 5\' 4"  (1.626 m)   Wt 105 lb (47.6 kg)   LMP 05/05/2016   SpO2 96%   BMI 18.02 kg/m  Physical Exam  Constitutional: She is oriented to person, place, and time. She appears well-developed and well-nourished. No distress.  HENT:  Head: Normocephalic and atraumatic.  Eyes: Conjunctivae are normal. Pupils are equal, round, and reactive to light.  Neck: Neck supple. No tracheal deviation present. No thyromegaly present.  Cardiovascular: Regular rhythm and intact distal pulses.   No murmur heard. Mildly tachycardic. Heart rate counted at 10 4 bpm by me  Pulmonary/Chest: Effort normal and breath sounds normal.  Abdominal: Soft. Bowel sounds are normal. She exhibits no distension. There is no tenderness.  Musculoskeletal: Normal range of motion. She exhibits no edema or tenderness.  Or extremities without redness swelling or tenderness. Neurovascularly intact. DP pulses 2+ bilaterally  radial pulses 2+ bilaterally  Neurological: She is alert and oriented to person, place, and time. Coordination normal.  Gait normal. Cranial nerves II through XII intact result extremity swelling. Motor strength 5 over 5 overall  Skin: Skin is warm and dry. No rash noted.  Psychiatric: She has a normal mood and affect.  Nursing note and vitals reviewed.    ED Treatments / Results  Labs (all labs ordered are listed, but only abnormal results are displayed) Labs Reviewed  I-STAT CHEM 8, ED    EKG  EKG Interpretation None       Radiology No results found.  Procedures Procedures (including critical care time)  Medications Ordered in ED Medications - No data to display  Results for orders placed or performed during the hospital encounter of 05/21/16  I-Stat Chem 8, ED  Result Value Ref Range   Sodium 141 135 - 145 mmol/L   Potassium 3.6 3.5 - 5.1 mmol/L   Chloride 106 101 - 111 mmol/L   BUN 9 6 - 20 mg/dL   Creatinine, Ser 1.610.50 0.44 - 1.00 mg/dL   Glucose, Bld 096105 (H) 65 - 99 mg/dL   Calcium, Ion 0.451.19 4.091.15 - 1.40 mmol/L   TCO2 23 0 - 100 mmol/L   Hemoglobin 10.2 (L) 12.0 - 15.0 g/dL   HCT 81.130.0 (L) 91.436.0 - 78.246.0 %   No results found. Initial Impression / Assessment and Plan / ED Course  I have reviewed the triage vital signs and the nursing notes.  Pertinent labs & imaging results that were available during my care of the patient were reviewed by me and considered in my medical decision making (see chart for details).  Clinical Course    I see no edema on exam I counseled patient for 5 minutes on smoking cessation [facility] to get a primary care physician Labwork consistent with mild anemia. Plan Tylenol for pain Final Clinical Impressions(s) / ED Diagnoses  Diagnoses #1 nonspecific headache #2 pedal edema by history #3 tobacco abuse Final diagnoses:  None    New Prescriptions New Prescriptions   No  medications on file     Doug SouSam Zavior Thomason, MD 05/21/16 2332

## 2016-05-21 NOTE — ED Triage Notes (Addendum)
PT C/O BILATERAL FOOT PAIN AND SWELLING SINCE LAST NIGHT. 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 DENIES INJURY. PT ALSO STS RECURRENT HEADACHES WITH KNOWN CAUSE.

## 2016-05-21 NOTE — ED Notes (Signed)
No respiratory or acute distress noted alert and oriented x 3 call light in reach visitor at bedside. 

## 2016-05-21 NOTE — Discharge Instructions (Signed)
Take Tylenol as directed for pain.wear supportive shoes at work. Call [facility] next week to arrange to get a primary care physician. Ask your primary care physician to help you to stop smoking. Lab work shows that you are mildly anemic, otherwise normal. Return if concern for any reason.

## 2016-05-23 ENCOUNTER — Emergency Department (HOSPITAL_COMMUNITY)
Admission: EM | Admit: 2016-05-23 | Discharge: 2016-05-23 | Disposition: A | Discharge status: Home / Self Care | Payer: Medicaid Other | Attending: Emergency Medicine | Admitting: Emergency Medicine

## 2016-05-23 ENCOUNTER — Emergency Department (HOSPITAL_COMMUNITY): Payer: Medicaid Other

## 2016-05-23 ENCOUNTER — Encounter (HOSPITAL_COMMUNITY): Payer: Self-pay | Admitting: Emergency Medicine

## 2016-05-23 DIAGNOSIS — F17210 Nicotine dependence, cigarettes, uncomplicated: Secondary | ICD-10-CM | POA: Diagnosis not present

## 2016-05-23 DIAGNOSIS — Z7982 Long term (current) use of aspirin: Secondary | ICD-10-CM | POA: Insufficient documentation

## 2016-05-23 DIAGNOSIS — T7840XA Allergy, unspecified, initial encounter: Secondary | ICD-10-CM | POA: Diagnosis not present

## 2016-05-23 DIAGNOSIS — R51 Headache: Secondary | ICD-10-CM | POA: Diagnosis not present

## 2016-05-23 DIAGNOSIS — R519 Headache, unspecified: Secondary | ICD-10-CM

## 2016-05-23 LAB — CBC WITH DIFFERENTIAL/PLATELET
BASOS PCT: 0 %
Basophils Absolute: 0 10*3/uL (ref 0.0–0.1)
EOS PCT: 3 %
Eosinophils Absolute: 0.2 10*3/uL (ref 0.0–0.7)
HEMATOCRIT: 29 % — AB (ref 36.0–46.0)
HEMOGLOBIN: 9.3 g/dL — AB (ref 12.0–15.0)
LYMPHS PCT: 27 %
Lymphs Abs: 1.5 10*3/uL (ref 0.7–4.0)
MCH: 21.1 pg — AB (ref 26.0–34.0)
MCHC: 32.1 g/dL (ref 30.0–36.0)
MCV: 65.9 fL — AB (ref 78.0–100.0)
MONO ABS: 0.7 10*3/uL (ref 0.1–1.0)
MONOS PCT: 13 %
NEUTROS PCT: 57 %
Neutro Abs: 3.1 10*3/uL (ref 1.7–7.7)
Platelets: 183 10*3/uL (ref 150–400)
RBC: 4.4 MIL/uL (ref 3.87–5.11)
RDW: 15.3 % (ref 11.5–15.5)
WBC: 5.5 10*3/uL (ref 4.0–10.5)

## 2016-05-23 LAB — COMPREHENSIVE METABOLIC PANEL
ALK PHOS: 53 U/L (ref 38–126)
ALT: 28 U/L (ref 14–54)
AST: 26 U/L (ref 15–41)
Albumin: 2.9 g/dL — ABNORMAL LOW (ref 3.5–5.0)
Anion gap: 1 — ABNORMAL LOW (ref 5–15)
BILIRUBIN TOTAL: 0.9 mg/dL (ref 0.3–1.2)
BUN: 10 mg/dL (ref 6–20)
CALCIUM: 8.4 mg/dL — AB (ref 8.9–10.3)
CO2: 24 mmol/L (ref 22–32)
CREATININE: 0.48 mg/dL (ref 0.44–1.00)
Chloride: 111 mmol/L (ref 101–111)
Glucose, Bld: 88 mg/dL (ref 65–99)
Potassium: 3.5 mmol/L (ref 3.5–5.1)
Sodium: 136 mmol/L (ref 135–145)
Total Protein: 5.5 g/dL — ABNORMAL LOW (ref 6.5–8.1)

## 2016-05-23 MED ORDER — PREDNISONE 20 MG PO TABS: ORAL_TABLET | ORAL | 0 refills | Status: DC

## 2016-05-23 NOTE — ED Notes (Signed)
Pt given sandwich, crackers and juice

## 2016-05-23 NOTE — ED Notes (Signed)
Patient was alert, oriented and stable upon discharge. RN went over AVS and patient had no further questions.  

## 2016-05-23 NOTE — Discharge Instructions (Signed)
Take Benadryl for any itching and follow-up with her family doctor effaces not improving

## 2016-05-23 NOTE — ED Notes (Signed)
Patient transported to CT 

## 2016-05-23 NOTE — ED Provider Notes (Signed)
[facility]-EMERG[facility]Y DEPT Provider Note   CSN: [id number] Arrival date & time: 05/23/16  1244     History   Chief Complaint Chief Complaint  Patient presents with  . Facial Swelling  . Headache    HPI Sarah Fritz is a 35 y.o. female.  Patient complains of a headache for a number days. Also she started with swelling around her right eye   The history is provided by the patient. No language interpreter was used.  Headache   This is a new problem. The current episode started more than 2 days ago. The problem occurs constantly. The headache is associated with nothing. The pain is located in the bilateral region. The quality of the pain is described as dull. The pain is at a severity of 7/10. The pain does not radiate. Pertinent negatives include no anorexia. She has tried nothing for the symptoms. The treatment provided no relief.    Past Medical History:  Diagnosis Date  . Abscess   . Anemia   . Chlamydia   . Depression   . Gonorrhea     Patient Active Problem List   Diagnosis Date Noted  . Chlamydia 04/18/2013  . Recurrent UTI 04/18/2013    Past Surgical History:  Procedure Laterality Date  . WISDOM TOOTH EXTRACTION      OB History    Gravida Para Term Preterm AB Living   1 1 1     1    SAB TAB Ectopic Multiple Live Births                   Home Medications    Prior to Admission medications   Medication Sig Start Date End Date Taking? Authorizing Provider  Aspirin-Salicylamide-Caffeine (BC HEADACHE POWDER PO) Take 1 Package by mouth daily as needed (headache).   Yes Historical Provider, MD  predniSONE (DELTASONE) 20 MG tablet 2 tabs po daily x 3 days 05/23/16   Bethann Berkshire, MD    Family History Family History  Problem Relation Age of Onset  . Heart disease Mother   . Kidney disease Mother   . COPD Mother   . Cancer Mother   . Cancer Father   . COPD Father     Social History Social History  Substance Use Topics  . Smoking status: Current  Every Day Smoker    Packs/day: 1.50    Types: Cigarettes  . Smokeless tobacco: Never Used  . Alcohol use No     Comment: occasional     Allergies   Percocet [oxycodone-acetaminophen] and Vicodin [hydrocodone-acetaminophen]   Review of Systems Review of Systems  Constitutional: Negative for appetite change and fatigue.  HENT: Negative for congestion, ear discharge and sinus pressure.   Eyes: Negative for discharge.  Respiratory: Negative for cough.   Cardiovascular: Negative for chest pain.  Gastrointestinal: Negative for abdominal pain, anorexia and diarrhea.  Genitourinary: Negative for frequency and hematuria.  Musculoskeletal: Negative for back pain.  Skin: Negative for rash.  Neurological: Positive for headaches. Negative for seizures.  Psychiatric/Behavioral: Negative for hallucinations.     Physical Exam Updated Vital Signs BP 137/81 (BP Location: Left Arm)   Pulse 94   Temp 97.2 F (36.2 C) (Oral)   Resp 16   Ht 5\' 4"  (1.626 m)   Wt 105 lb (47.6 kg)   LMP 05/05/2016   SpO2 100%   BMI 18.02 kg/m   Physical Exam  Constitutional: She is oriented to person, place, and time. She appears well-developed.  HENT:  Head: Normocephalic.  Mild swelling to right eyelid and inferior to right eye. Possible allergic reaction  Eyes: Conjunctivae and EOM are normal. No scleral icterus.  Neck: Neck supple. No tracheal deviation present. No thyromegaly present.  Cardiovascular: Normal rate and regular rhythm.  Exam reveals no gallop and no friction rub.   No murmur heard. Pulmonary/Chest: No stridor. She has no wheezes. She has no rales. She exhibits no tenderness.  Abdominal: She exhibits no distension. There is no tenderness. There is no rebound.  Musculoskeletal: Normal range of motion. She exhibits no edema.  Lymphadenopathy:    She has no cervical adenopathy.  Neurological: She is oriented to person, place, and time. She exhibits normal muscle tone. Coordination  normal.  Skin: Skin is warm. No rash noted. No erythema.  Psychiatric: She has a normal mood and affect. Her behavior is normal.     ED Treatments / Results  Labs (all labs ordered are listed, but only abnormal results are displayed) Labs Reviewed  CBC WITH DIFFERENTIAL/PLATELET - Abnormal; Notable for the following:       Result Value   Hemoglobin 9.3 (*)    HCT 29.0 (*)    MCV 65.9 (*)    MCH 21.1 (*)    All other components within normal limits  COMPREHENSIVE METABOLIC PANEL - Abnormal; Notable for the following:    Calcium 8.4 (*)    Total Protein 5.5 (*)    Albumin 2.9 (*)    Anion gap 1 (*)    All other components within normal limits    EKG  EKG Interpretation None       Radiology Ct Head Wo Contrast  Result Date: 05/23/2016 CLINICAL DATA:  Bilateral foot hand and facial swelling as well as a headache for a few days. EXAM: CT HEAD WITHOUT CONTRAST TECHNIQUE: Contiguous axial images were obtained from the base of the skull through the vertex without intravenous contrast. COMPARISON:  None. FINDINGS: Brain: No evidence of acute infarction, hemorrhage, hydrocephalus, extra-axial collection or mass lesion/mass effect. Vascular: No hyperdense vessel or unexpected calcification. Skull: Normal. Negative for fracture or focal lesion. Sinuses/Orbits: No acute finding. Other: None. IMPRESSION: Unremarkable CT evaluation of the brain. Electronically Signed   By: Kennith CenterEric  Mansell M.D.   On: 05/23/2016 14:57    Procedures Procedures (including critical care time)  Medications Ordered in ED Medications - No data to display   Initial Impression / Assessment and Plan / ED Course  I have reviewed the triage vital signs and the nursing notes.  Pertinent labs & imaging results that were available during my care of the patient were reviewed by me and considered in my medical decision making (see chart for details).  Clinical Course    Labs unremarkable except for mild chronic  anemia. Headache improved with treatment. Patient was fully around right eye will treat his allergic reaction patient put on prednisone for 3 days and will take Benadryl if needed and follow-up as needed  Final Clinical Impressions(s) / ED Diagnoses   Final diagnoses:  Headache behind the eyes  Allergic reaction, initial encounter    New Prescriptions New Prescriptions   PREDNISONE (DELTASONE) 20 MG TABLET    2 tabs po daily x 3 days     Bethann BerkshireJoseph Berkley Cronkright, MD 05/23/16 1631

## 2016-05-23 NOTE — ED Triage Notes (Signed)
Pt c/o bilateral foot, hand and facial swelling as well as a headache x a few days. Pt was seen on 10/7 for the foot swelling and was told it was nothing. Pt A&Ox4 and ambulatory, denies dizziness.

## 2016-06-09 ENCOUNTER — Ambulatory Visit (HOSPITAL_COMMUNITY)
Admission: EM | Admit: 2016-06-09 | Discharge: 2016-06-09 | Disposition: A | Discharge status: Home / Self Care | Payer: Medicaid Other | Attending: Family Medicine | Admitting: Family Medicine

## 2016-06-09 ENCOUNTER — Encounter (HOSPITAL_COMMUNITY): Payer: Self-pay | Admitting: Emergency Medicine

## 2016-06-09 DIAGNOSIS — T783XXA Angioneurotic edema, initial encounter: Secondary | ICD-10-CM

## 2016-06-09 MED ORDER — PREDNISONE 50 MG PO TABS: 50.0000 mg | ORAL_TABLET | Freq: Every day | ORAL | 0 refills | Status: DC

## 2016-06-09 NOTE — ED Provider Notes (Signed)
[facility]    CSN: [id number] Arrival date & time: 06/09/16  1400  History   Chief Complaint Chief Complaint  Patient presents with  . Facial Swelling    feet and hands   HPI Sarah Fritz is a 35 y.o. female presenting for swelling.   She reports a history of intermittent swelling for many months and notes this has worsened this past month, nearly constant, involving her hands and feet and sometimes the face. Never the tongue or throat. This causes pain and she reports using ibuprofen and BC's prn. Just started a new job in the past month and has been taking NSAIDs daily because she stands all day. She's been seen for this a couple times and prescribed prednisone which relieved symptoms temporarily. She's not tried any other medications for this. Swelling occurs at all time of day.   Past Medical History:  Diagnosis Date  . Abscess   . Anemia   . Chlamydia   . Depression   . Gonorrhea     Patient Active Problem List   Diagnosis Date Noted  . Chlamydia 04/18/2013  . Recurrent UTI 04/18/2013    Past Surgical History:  Procedure Laterality Date  . WISDOM TOOTH EXTRACTION      OB History    Gravida Para Term Preterm AB Living   1 1 1     1    SAB TAB Ectopic Multiple Live Births                 Home Medications    Prior to Admission medications   Medication Sig Start Date End Date Taking? Authorizing Provider  predniSONE (DELTASONE) 50 MG tablet Take 1 tablet (50 mg total) by mouth daily. 06/09/16   Tyrone Nine, MD    Family History Family History  Problem Relation Age of Onset  . Heart disease Mother   . Kidney disease Mother   . COPD Mother   . Cancer Mother   . Cancer Father   . COPD Father    Denies FH swelling that she knows of  Social History Social History  Substance Use Topics  . Smoking status: Current Every Day Smoker    Packs/day: 1.50    Types: Cigarettes  . Smokeless tobacco: Never Used  . Alcohol use No     Comment:  occasional   Smokes daily, has chronic AM cough. No wheezing.  Allergies   Percocet [oxycodone-acetaminophen] and Vicodin [hydrocodone-acetaminophen]   Review of Systems Review of Systems No fever, dyspnea, chest pain, palpitations, abdominal pain, change in urination. No pruritus.  Physical Exam Triage Vital Signs ED Triage Vitals [06/09/16 1419]  Enc Vitals Group     BP      Pulse      Resp      Temp      Temp src      SpO2      Weight      Height      Head Circumference      Peak Flow      Pain Score 8     Pain Loc      Pain Edu?      Excl. in GC?    No data found.   Updated Vital Signs LMP 05/30/2016 (Exact Date)   Visual Acuity Right Eye Distance:   Left Eye Distance:   Bilateral Distance:    Right Eye Near:   Left Eye Near:    Bilateral Near:  Physical Exam  Constitutional: She is oriented to person, place, and time. She appears well-developed and well-nourished. No distress.  HENT:  Mouth/Throat: Oropharynx is clear and moist.  mild right infraorbital fullness compared to left. No laryngeal or lingular swelling.   Eyes: EOM are normal. Pupils are equal, round, and reactive to light. No scleral icterus.  Neck: Neck supple. No JVD present.  Cardiovascular: Normal rate, regular rhythm, normal heart sounds and intact distal pulses.   No murmur heard. Pulmonary/Chest: Effort normal and breath sounds normal. No respiratory distress.  Abdominal: Soft. Bowel sounds are normal. She exhibits no distension. There is no tenderness.  Musculoskeletal: Normal range of motion. She exhibits no tenderness.  Lymphadenopathy:    She has no cervical adenopathy.  Neurological: She is alert and oriented to person, place, and time. She exhibits normal muscle tone.  Skin: Skin is warm and dry.  Nonpitting edema involving bilateral hands without extension past the wrist. Full AROM of all fingers, normal strength, cap refill < 2 sec, radial pulses 2+. Bilateral feet with  very mild swelling with otherwise normal exam, neurovascularly intact.  Vitals reviewed.  UC Treatments / Results  Labs (all labs ordered are listed, but only abnormal results are displayed) Labs Reviewed - No data to display  EKG  EKG Interpretation None       Radiology No results found.  Procedures Procedures (including critical care time)  Medications Ordered in UC Medications - No data to display   Initial Impression / Assessment and Plan / UC Course  I have reviewed the triage vital signs and the nursing notes.  Pertinent labs & imaging results that were available during my care of the patient were reviewed by me and considered in my medical decision making (see chart for details).  Final Clinical Impressions(s) / UC Diagnoses   Final diagnoses:  Angioedema, initial encounter   35 y.o. female presenting for recurrent idiopathic edema. No known FH of swelling. Worsening is coincident in this case with beginning new job, though no typical exposures were found on history including chemical and vibratory provocative factors. Did start taking NSAIDs more frequently, so this could be causing/worsening symptoms.  - Avoid NSAIDs, take tylenol only prn pain - Prednisone 5 day burst - Trial benadryl  - Also recommended smoking cessation - Follow up with PCP as scheduled or return if symptoms worsen.  New Prescriptions New Prescriptions   PREDNISONE (DELTASONE) 50 MG TABLET    Take 1 tablet (50 mg total) by mouth daily.     Tyrone Nineyan B Grunz, MD 06/09/16 [phone]

## 2016-06-09 NOTE — ED Triage Notes (Signed)
Pt has been having bilateral foot and hand swelling and facial swelling around her eyes intermittently for one month.  Pt was seen and treated in the ED earlier this month.  She was sent home on three days of prednisone and that seemed to help, but once she was done with the medication, it returned.

## 2016-06-09 NOTE — Discharge Instructions (Signed)
Take rpednisone daily as directed STOP taking all NSAIDs, take tylenol as needed Try benadryl at night to see if this helps symptoms Follow up with your PCP  Return if you have any worsening symptoms or trouble breathing

## 2016-06-17 ENCOUNTER — Ambulatory Visit: Payer: Medicaid Other | Attending: Internal Medicine | Admitting: Physician Assistant

## 2016-06-17 ENCOUNTER — Other Ambulatory Visit: Payer: Self-pay | Admitting: Physician Assistant

## 2016-06-17 VITALS — BP 132/82 | HR 72 | Temp 98.5°F | Resp 20 | Ht 64.0 in | Wt 104.0 lb

## 2016-06-17 DIAGNOSIS — M7989 Other specified soft tissue disorders: Secondary | ICD-10-CM | POA: Insufficient documentation

## 2016-06-17 DIAGNOSIS — F17210 Nicotine dependence, cigarettes, uncomplicated: Secondary | ICD-10-CM | POA: Insufficient documentation

## 2016-06-17 DIAGNOSIS — F17200 Nicotine dependence, unspecified, uncomplicated: Secondary | ICD-10-CM | POA: Diagnosis not present

## 2016-06-17 DIAGNOSIS — R5383 Other fatigue: Secondary | ICD-10-CM | POA: Insufficient documentation

## 2016-06-17 DIAGNOSIS — F329 Major depressive disorder, single episode, unspecified: Secondary | ICD-10-CM | POA: Diagnosis not present

## 2016-06-17 LAB — TSH

## 2016-06-17 NOTE — Progress Notes (Signed)
1. F/u hospital visit for angioedema: hand/feet edema.  Prednisone started 06/09/16 2. Cough x 2 weeks. 3. Smoking Cessation.

## 2016-06-17 NOTE — Patient Instructions (Signed)
Slow Fe Iron supplement 1 daily  Zyrtec(cetirizine)10 mg 1 daily

## 2016-06-18 LAB — VITAMIN D 25 HYDROXY (VIT D DEFICIENCY, FRACTURES): Vit D, 25-Hydroxy: 15 ng/mL — ABNORMAL LOW (ref 30–100)

## 2016-06-18 LAB — SEDIMENTATION RATE: SED RATE: 1 mm/h (ref 0–20)

## 2016-06-18 NOTE — Progress Notes (Signed)
Sarah Fritz, is a 35 y.o. female  XBM:[id number]:[id number]  [id number]  DOB - [birth year]  Subjective:  Chief Complaint and HPI: Sarah Fritz is a 35 y.o. female here today to establish care and for a follow up visit since being seen in the ED for face, foot, and hand swelling. This has been occurring for the last month or so.  She denies any meds, new soaps, lotions, make-up, or detergents.  Benadryl helps but causes drowsiness.  Prednisone also helped.  The swelling of her face is usually when she first wakes up.  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  Her hands swell intermittently.  She is afraid that she has heart failure because her mom had it.  She also wants info on smoking cessation.    ED/Hospital notes/labs reviewed.     ROS:   Constitutional:  No f/c, No night sweats, No unexplained weight loss.  +fatigue EENT:  No vision changes, No blurry vision, No hearing changes. No mouth, throat, or ear problems.  Respiratory: No cough, No SOB Cardiac: No CP, no palpitations GI:  No abd pain, No N/V/D. GU: No Urinary s/sx Musculoskeletal: No joint pain Neuro: No headache, no dizziness, no motor weakness.  Skin: No rash Endocrine:  No polydipsia. No polyuria.  Psych: Denies SI/HI  No problems updated.  ALLERGIES: Allergies  Allergen Reactions  . Percocet [Oxycodone-Acetaminophen] Nausea And Vomiting and Other (See Comments)    Sweating, pass out  . Vicodin [Hydrocodone-Acetaminophen] Nausea And Vomiting and Other (See Comments)    Sweating, pass out    PAST MEDICAL HISTORY: Past Medical History:  Diagnosis Date  . Abscess   . Anemia   . Chlamydia   . Depression   . Gonorrhea     MEDICATIONS AT HOME: Prior to Admission medications   Medication Sig Start Date End Date Taking? Authorizing Provider  predniSONE (DELTASONE) 50 MG tablet Take 1 tablet (50 mg total) by mouth daily. 06/09/16  Yes Tyrone Nineyan B  Grunz, MD     Objective:  EXAM:   Vitals:   06/17/16 1638  BP: 132/82  Pulse: 72  Resp: 20  Temp: 98.5 F (36.9 C)  TempSrc: Oral  SpO2: 99%  Weight: 104 lb (47.2 kg)  Height: 5\' 4"  (1.626 m)    General appearance : A&OX3. NAD. Non-toxic-appearing HEENT: Atraumatic and Normocephalic.  PERRLA. EOM intact.  TM clear B. Mouth-MMM, post pharynx WNL w/o erythema, No PND. Neck: supple, no JVD. No cervical lymphadenopathy. No thyromegaly Chest/Lungs:  Breathing-non-labored, Good air entry bilaterally, breath sounds normal without rales, rhonchi, or wheezing  CVS: S1 S2 regular, no murmurs, gallops, rubs  Extremities: Bilateral Lower Ext shows no edema, both legs are warm to touch with = pulse throughout.  No swelling of hands, feet, or face currnetly Neurology:  CN II-XII grossly intact, Non focal.   Psych:  TP linear. J/I WNL. Normal speech. Appropriate eye contact and affect.  Skin:  No Rash  Data Review No results found for: HGBA1C   Assessment & Plan   1. Other fatigue - Vitamin D, 25-hydroxy - TSH  2. Bilateral swelling of feet, face, and hands.  Responds to Benadryl and responded to a course of prednisone.  I have advised her to take Zyrtec daily for the next 2-3 weeks.  - Sedimentation Rate  3. Smoking [id number]-QUIT-NOW infor given.  Counseled at length about setting quit date,  support system.   Patient have been counseled extensively about nutrition and exercise  Return in about 3 weeks (around 07/08/2016) for establish with PCP; recheck swelling and CPE.  The patient was given clear instructions to go to ER or return to medical center if symptoms don't improve, worsen or new problems develop. The patient verbalized understanding. The patient was told to call to get lab results if they haven't heard anything in the next week.     Georgian CoAngela Donye Dauenhauer, PA-C [facility] [place], [place] [phone]   06/18/2016, 6:02 PMPatient ID:  Sarah Fritz, female   DOB: [birth year], 35 y.o.   MRN: [id number]

## 2016-06-20 LAB — T4, FREE: FREE T4: 3.8 ng/dL — AB (ref 0.8–1.8)

## 2016-06-20 LAB — T3, FREE

## 2016-06-23 ENCOUNTER — Other Ambulatory Visit: Payer: Self-pay | Admitting: Physician Assistant

## 2016-06-23 DIAGNOSIS — E559 Vitamin D deficiency, unspecified: Secondary | ICD-10-CM

## 2016-06-23 MED ORDER — VITAMIN D (ERGOCALCIFEROL) 1.25 MG (50000 UNIT) PO CAPS: 50000.0000 [IU] | ORAL_CAPSULE | ORAL | 0 refills | Status: DC

## 2016-06-24 ENCOUNTER — Telehealth: Payer: Self-pay

## 2016-06-24 NOTE — Telephone Encounter (Signed)
Contacted pt to go over lab results pt is aware if results and is schedule with Dr. Hyman HopesJegede on Thursday at 2pm.

## 2016-06-30 ENCOUNTER — Encounter: Payer: Self-pay | Admitting: Internal Medicine

## 2016-06-30 ENCOUNTER — Encounter: Payer: Self-pay | Admitting: *Deleted

## 2016-06-30 ENCOUNTER — Ambulatory Visit (HOSPITAL_BASED_OUTPATIENT_CLINIC_OR_DEPARTMENT_OTHER): Payer: Medicaid Other | Admitting: Internal Medicine

## 2016-06-30 ENCOUNTER — Ambulatory Visit: Payer: Medicaid Other | Attending: Internal Medicine | Admitting: Internal Medicine

## 2016-06-30 VITALS — BP 120/75 | HR 108 | Temp 98.3°F | Resp 18 | Ht 64.0 in | Wt 106.6 lb

## 2016-06-30 DIAGNOSIS — Z23 Encounter for immunization: Secondary | ICD-10-CM | POA: Insufficient documentation

## 2016-06-30 NOTE — Progress Notes (Signed)
Patient is here for Thyroid  Patient denies pain at this time.  Patient has not taken medication today. Patient has eaten today.  Patient would like the flu vaccine today.  Patient would like the nicotine patch to assist with quitting.  Patient denies any suicidal ideations at this time.

## 2016-07-14 ENCOUNTER — Encounter: Payer: Self-pay | Admitting: Internal Medicine

## 2016-07-14 ENCOUNTER — Other Ambulatory Visit: Payer: Self-pay | Admitting: Internal Medicine

## 2016-07-14 ENCOUNTER — Ambulatory Visit: Payer: Medicaid Other | Attending: Internal Medicine | Admitting: Internal Medicine

## 2016-07-14 VITALS — BP 108/70 | HR 110 | Temp 98.2°F | Resp 18 | Ht 64.0 in | Wt 106.0 lb

## 2016-07-14 DIAGNOSIS — E0590 Thyrotoxicosis, unspecified without thyrotoxic crisis or storm: Secondary | ICD-10-CM | POA: Diagnosis not present

## 2016-07-14 DIAGNOSIS — F329 Major depressive disorder, single episode, unspecified: Secondary | ICD-10-CM | POA: Insufficient documentation

## 2016-07-14 DIAGNOSIS — F17200 Nicotine dependence, unspecified, uncomplicated: Secondary | ICD-10-CM | POA: Insufficient documentation

## 2016-07-14 DIAGNOSIS — G894 Chronic pain syndrome: Secondary | ICD-10-CM

## 2016-07-14 DIAGNOSIS — Z23 Encounter for immunization: Secondary | ICD-10-CM | POA: Diagnosis not present

## 2016-07-14 LAB — LIPID PANEL
CHOL/HDL RATIO: 3.7 ratio (ref <5.0)
Cholesterol: 125 mg/dL (ref <200)
HDL: 34 mg/dL — AB (ref >50)
LDL CALC: 38 mg/dL (ref <100)
Triglycerides: 267 mg/dL — ABNORMAL HIGH (ref <150)
VLDL: 53 mg/dL — ABNORMAL HIGH (ref <30)

## 2016-07-14 MED ORDER — NAPROXEN 500 MG PO TABS: 500.0000 mg | ORAL_TABLET | Freq: Two times a day (BID) | ORAL | 0 refills | Status: DC

## 2016-07-14 NOTE — Progress Notes (Signed)
Sarah Fritz, is a 35 y.o. female  ZOX:[id number]:[id number]  [id number]  DOB - 1981/05/24  Chief Complaint  Patient presents with  . Hyperthyroidism       Subjective:   Sarah Fritz is a 35 y.o. female here today for abnormal lab results follow up visit. Patient was recently seen here to establish care following an ED/UC visit for intermittent swelling of face, hands and feet. Her TSH was found to be abnormally low and T3 and T4 very high. Patient is here today to discuss the test results and for further evaluation. She admits to occasional jitters and sweating especially at night but no diarrhea. She had noticed palpitation but not frequently. She denies frequent headache or blurry vision, but says she has headache today because of a sudden death in the family this morning and the stress associated with this sudden loss. She has no swallowing difficulty. Patient has No chest pain, No abdominal pain - No Nausea, No new weakness tingling or numbness, No Cough - SOB.  Problem  Hyperthyroidism  Chronic Pain Syndrome  Smoking    ALLERGIES: Allergies  Allergen Reactions  . Percocet [Oxycodone-Acetaminophen] Nausea And Vomiting and Other (See Comments)    Sweating, pass out  . Vicodin [Hydrocodone-Acetaminophen] Nausea And Vomiting and Other (See Comments)    Sweating, pass out    PAST MEDICAL HISTORY: Past Medical History:  Diagnosis Date  . Abscess   . Anemia   . Chlamydia   . Depression   . Gonorrhea     MEDICATIONS AT HOME: Prior to Admission medications   Medication Sig Start Date End Date Taking? Authorizing Provider  Vitamin D, Ergocalciferol, (DRISDOL) 50000 units CAPS capsule Take 1 capsule (50,000 Units total) by mouth every 7 (seven) days. 06/23/16  Yes Marzella SchleinAngela M McClung, PA-C  naproxen (NAPROSYN) 500 MG tablet Take 1 tablet (500 mg total) by mouth 2 (two) times daily with a meal. 07/14/16   Quentin Angstlugbemiga E Simisola Sandles, MD    Objective:   Vitals:   07/14/16 1632    BP: 108/70  Pulse: (!) 110  Resp: 18  Temp: 98.2 F (36.8 C)  TempSrc: Oral  SpO2: 100%  Weight: 106 lb (48.1 kg)  Height: 5\' 4"  (1.626 m)   Exam General appearance : Awake, alert, not in any distress. Speech Clear. Not toxic looking, thin built HEENT: Atraumatic and Normocephalic, pupils equally reactive to light and accomodation Neck: Supple, no JVD. No cervical lymphadenopathy. Fullness in the  Neck, possibly right sided thyromegaly. Chest: Good air entry bilaterally, no added sounds  CVS: S1 S2 regular, no murmurs.  Abdomen: Bowel sounds present, Non tender and not distended with no gaurding, rigidity or rebound. Extremities: B/L Lower Ext shows no edema, both legs are warm to touch Neurology: Awake alert, and oriented X 3, CN II-XII intact, Non focal Skin: No Rash  Data Review No results found for: HGBA1C  Assessment & Plan   1. Hyperthyroidism, possibly thyromegaly Vs Nodule  - US THYROID; Future - Thyroid Stimulating Immunoglobulin - Thyroid Panel With TSH - Thyroid peroxidase antibody - Lipid panel  2. Chronic pain syndrome  - naproxen (NAPROSYN) 500 MG tablet; Take 1 tablet (500 mg total) by mouth 2 (two) times daily with a meal.  Dispense: 60 tablet; Refill: 0 - ANA  3. Smoking  [name] was counseled on the dangers of tobacco use, and was advised to quit. Reviewed strategies to maximize success, including removing cigarettes and smoking materials from environment, stress management and support of  family/friends.   Patient have been counseled extensively about nutrition and exercise. Other issues discussed during this visit include: low cholesterol diet, weight control and daily exercise, foot care, annual eye examinations at Ophthalmology, importance of adherence with medications and regular follow-up.   Return in about 4 weeks (around 08/11/2016) for Hyperthyroidism.  The patient was given clear instructions to go to ER or return to medical center if symptoms  don't improve, worsen or new problems develop. The patient verbalized understanding. The patient was told to call to get lab results if they haven't heard anything in the next week.   This note has been created with Education officer, environmentalDragon speech recognition software and smart phrase technology. Any transcriptional errors are unintentional.    Jeanann LewandowskyJEGEDE, Frederico Gerling, MD, MHA, FACP, FAAP, CPE [facility] [place], [place] [phone]   07/14/2016, 5:10 PM

## 2016-07-14 NOTE — Progress Notes (Signed)
Patient is here for Thyroid  Patient complains of a HA being present today due to a family member dying suddenly  Patient has taken medication today. Patient has eaten today

## 2016-07-15 LAB — THYROID PANEL WITH TSH
Free Thyroxine Index: 8.5 — ABNORMAL HIGH (ref 1.4–3.8)
T3 UPTAKE: 44 % — AB (ref 22–35)
T4 TOTAL: 19.4 ug/dL — AB (ref 4.5–12.0)

## 2016-07-15 LAB — THYROID PEROXIDASE ANTIBODY: Thyroperoxidase Ab SerPl-aCnc: <1 IU/mL (ref <9)

## 2016-07-18 ENCOUNTER — Telehealth: Payer: Self-pay | Admitting: *Deleted

## 2016-07-18 ENCOUNTER — Other Ambulatory Visit: Payer: Self-pay | Admitting: Internal Medicine

## 2016-07-18 DIAGNOSIS — E0590 Thyrotoxicosis, unspecified without thyrotoxic crisis or storm: Secondary | ICD-10-CM

## 2016-07-18 LAB — THYROID STIMULATING IMMUNOGLOBULIN: TSI: 506 %{baseline} — AB (ref <140)

## 2016-07-18 LAB — ANA: ANA: NEGATIVE

## 2016-07-18 NOTE — Telephone Encounter (Signed)
Patient verified DOB Patient is aware of thyroid being overactive and patient was referred to Endocrinologist. Patient expressed her understanding and had no further questions at this time.

## 2016-07-18 NOTE — Telephone Encounter (Signed)
-----   Message from Quentin Angstlugbemiga E Jegede, MD sent at 07/18/2016  3:22 PM EST ----- Please inform patient that her thyroid function is still hyperactive. Will refer her to Endocrinologist while awaiting the thyroid ultrasound.

## 2016-07-18 NOTE — Telephone Encounter (Signed)
Medical Assistant left message on patient's home and cell voicemail. Voicemail states to give a call back to Abdirizak Richison with [facility] at [phone].  

## 2016-07-22 ENCOUNTER — Telehealth: Payer: Self-pay | Admitting: Internal Medicine

## 2016-07-22 NOTE — Telephone Encounter (Signed)
What do you advise? The insurance company is stating the lab results should suffice in confirming the condition. I need another detail as to why the imagining is necessary in order for me to call and appeal the denial.

## 2016-07-22 NOTE — Telephone Encounter (Signed)
[employer]  Denied the procedure us thyroid patient has an appointment 07-29-16 @ 7:30am  Please, contact radiology and patient to canceled the appointment or if pcp or assistant  wants to appeal can contact  [phone] case # [id number]  Thank You   Based on [employer] Neck Imaging Guidelines, we are unable to approve the requested procedure. Results of a recent thyroid-stimulating hormone (TSH) is supported for the initial evaluation of a palpable thyroid nodule, goiter, or asymmetric neck enlargement. These results will help determine imaging needs. The clinical information provided does not describe these results and, therefore, the requested neck imaging is not indicated at this time

## 2016-07-25 ENCOUNTER — Other Ambulatory Visit: Payer: Self-pay | Admitting: Internal Medicine

## 2016-07-25 ENCOUNTER — Other Ambulatory Visit: Payer: Self-pay | Admitting: *Deleted

## 2016-07-25 ENCOUNTER — Ambulatory Visit (HOSPITAL_COMMUNITY): Payer: Medicaid Other

## 2016-07-25 DIAGNOSIS — E0590 Thyrotoxicosis, unspecified without thyrotoxic crisis or storm: Secondary | ICD-10-CM

## 2016-07-25 NOTE — Progress Notes (Signed)
Patients US of the Thyroid was changed to Nuclear Thyroid Scan due to insurance denial of original US order.

## 2016-07-27 DIAGNOSIS — E0500 Thyrotoxicosis with diffuse goiter without thyrotoxic crisis or storm: Secondary | ICD-10-CM | POA: Insufficient documentation

## 2016-07-28 ENCOUNTER — Telehealth: Payer: Self-pay | Admitting: Internal Medicine

## 2016-07-28 NOTE — Telephone Encounter (Signed)
Dr. Hyman HopesJegede spoke with the reps and was instructed to order a Nuclear Thyroid Scan. I placed this order in the system and she should be scheduled for that procedure with no problems.

## 2016-07-28 NOTE — Telephone Encounter (Signed)
Thank you can you call radiology and patient to cancel the appointment

## 2016-07-28 NOTE — Telephone Encounter (Signed)
Pam from [employer] [employer] called saying if  Mrs Sarah Fritz still in the schedule by 3pm  And no receiving approval [facility] they will canceled the appointment Thank you

## 2016-07-29 ENCOUNTER — Ambulatory Visit (HOSPITAL_COMMUNITY): Admission: RE | Admit: 2016-07-29 | Payer: Medicaid Other | Source: Ambulatory Visit

## 2016-08-31 ENCOUNTER — Encounter: Payer: Medicaid Other | Admitting: Internal Medicine

## 2016-09-07 ENCOUNTER — Encounter: Payer: Medicaid Other | Admitting: Internal Medicine

## 2016-09-21 ENCOUNTER — Encounter: Payer: Medicaid Other | Admitting: Internal Medicine

## 2016-10-12 ENCOUNTER — Ambulatory Visit: Payer: Medicaid Other | Admitting: Internal Medicine

## 2016-11-23 ENCOUNTER — Ambulatory Visit: Payer: Medicaid Other | Attending: Internal Medicine | Admitting: Internal Medicine

## 2016-11-23 ENCOUNTER — Other Ambulatory Visit (HOSPITAL_COMMUNITY)
Admission: RE | Admit: 2016-11-23 | Discharge: 2016-11-23 | Disposition: A | Discharge status: Home / Self Care | Payer: Medicaid Other | Source: Ambulatory Visit | Attending: Internal Medicine | Admitting: Internal Medicine

## 2016-11-23 VITALS — BP 146/88 | HR 81 | Temp 98.7°F | Resp 18 | Ht 64.0 in | Wt 106.0 lb

## 2016-11-23 DIAGNOSIS — Z124 Encounter for screening for malignant neoplasm of cervix: Secondary | ICD-10-CM | POA: Insufficient documentation

## 2016-11-23 DIAGNOSIS — F329 Major depressive disorder, single episode, unspecified: Secondary | ICD-10-CM | POA: Diagnosis not present

## 2016-11-23 DIAGNOSIS — F5101 Primary insomnia: Secondary | ICD-10-CM | POA: Insufficient documentation

## 2016-11-23 DIAGNOSIS — G894 Chronic pain syndrome: Secondary | ICD-10-CM

## 2016-11-23 DIAGNOSIS — E0590 Thyrotoxicosis, unspecified without thyrotoxic crisis or storm: Secondary | ICD-10-CM | POA: Insufficient documentation

## 2016-11-23 DIAGNOSIS — Z01419 Encounter for gynecological examination (general) (routine) without abnormal findings: Secondary | ICD-10-CM | POA: Diagnosis not present

## 2016-11-23 DIAGNOSIS — E0500 Thyrotoxicosis with diffuse goiter without thyrotoxic crisis or storm: Secondary | ICD-10-CM | POA: Diagnosis not present

## 2016-11-23 DIAGNOSIS — F17200 Nicotine dependence, unspecified, uncomplicated: Secondary | ICD-10-CM | POA: Diagnosis not present

## 2016-11-23 MED ORDER — RAMELTEON 8 MG PO TABS: 8.0000 mg | ORAL_TABLET | Freq: Every day | ORAL | 0 refills | Status: DC

## 2016-11-23 NOTE — Patient Instructions (Signed)
Hyperthyroidism Hyperthyroidism is when the thyroid is too active (overactive). Your thyroid is a large gland that is located in your neck. The thyroid helps to control how your body uses food (metabolism). When your thyroid is overactive, it produces too much of a hormone called thyroxine. What are the causes? Causes of hyperthyroidism may include:  Graves disease. This is when your immune system attacks the thyroid gland. This is the most common cause.  Inflammation of the thyroid gland.  Tumor in the thyroid gland or somewhere else.  Excessive use of thyroid medicines, including: ? Prescription thyroid supplement. ? Herbal supplements that mimic thyroid hormones.  Solid or fluid-filled lumps within your thyroid gland (thyroid nodules).  Excessive ingestion of iodine.  What increases the risk?  Being female.  Having a family history of thyroid conditions. What are the signs or symptoms? Signs and symptoms of hyperthyroidism may include:  Nervousness.  Inability to tolerate heat.  Unexplained weight loss.  Diarrhea.  Change in the texture of hair or skin.  Heart skipping beats or making extra beats.  Rapid heart rate.  Loss of menstruation.  Shaky hands.  Fatigue.  Restlessness.  Increased appetite.  Sleep problems.  Enlarged thyroid gland or nodules.  How is this diagnosed? Diagnosis of hyperthyroidism may include:  Medical history and physical exam.  Blood tests.  Ultrasound tests.  How is this treated? Treatment may include:  Medicines to control your thyroid.  Surgery to remove your thyroid.  Radiation therapy.  Follow these instructions at home:  Take medicines only as directed by your health care provider.  Do not use any tobacco products, including cigarettes, chewing tobacco, or electronic cigarettes. If you need help quitting, ask your health care provider.  Do not exercise or do physical activity until your health care provider  approves.  Keep all follow-up appointments as directed by your health care provider. This is important. Contact a health care provider if:  Your symptoms do not get better with treatment.  You have fever.  You are taking thyroid replacement medicine and you: ? Have depression. ? Feel mentally and physically slow. ? Have weight gain. Get help right away if:  You have decreased alertness or a change in your awareness.  You have abdominal pain.  You feel dizzy.  You have a rapid heartbeat.  You have an irregular heartbeat. This information is not intended to replace advice given to you by your health care provider. Make sure you discuss any questions you have with your health care provider. Document Released: 08/01/2005 Document Revised: 12/31/2015 Document Reviewed: 12/17/2013 Elsevier Interactive Patient Education  2017 Elsevier Inc.  

## 2016-11-23 NOTE — Progress Notes (Signed)
Patient is here for PAP  Patient complains of bilateral eye swelling. Patient complains of not being able to sleep.  Patient has taken medication today. Patient has eaten today.

## 2016-11-24 ENCOUNTER — Other Ambulatory Visit: Payer: Self-pay | Admitting: Internal Medicine

## 2016-11-24 LAB — T4, FREE: Free T4: 1.29 ng/dL (ref 0.82–1.77)

## 2016-11-24 LAB — CMP14+EGFR
A/G RATIO: 1.7 (ref 1.2–2.2)
ALT: 17 IU/L (ref 0–32)
AST: 20 IU/L (ref 0–40)
Albumin: 4 g/dL (ref 3.5–5.5)
Alkaline Phosphatase: 96 IU/L (ref 39–117)
BUN/Creatinine Ratio: 13 (ref 9–23)
BUN: 8 mg/dL (ref 6–20)
Bilirubin Total: 0.5 mg/dL (ref 0.0–1.2)
CALCIUM: 9.3 mg/dL (ref 8.7–10.2)
CHLORIDE: 103 mmol/L (ref 96–106)
CO2: 23 mmol/L (ref 18–29)
CREATININE: 0.63 mg/dL (ref 0.57–1.00)
GFR calc Af Amer: 133 mL/min/{1.73_m2} (ref >59)
GFR calc non Af Amer: 116 mL/min/{1.73_m2} (ref >59)
GLOBULIN, TOTAL: 2.4 g/dL (ref 1.5–4.5)
Glucose: 93 mg/dL (ref 65–99)
Potassium: 4.4 mmol/L (ref 3.5–5.2)
Sodium: 139 mmol/L (ref 134–144)
TOTAL PROTEIN: 6.4 g/dL (ref 6.0–8.5)

## 2016-11-24 LAB — HIV ANTIBODY (ROUTINE TESTING W REFLEX): HIV SCREEN 4TH GENERATION: NONREACTIVE

## 2016-11-24 LAB — TSH

## 2016-11-24 LAB — CBC WITH DIFFERENTIAL/PLATELET
Basophils Absolute: 0 10*3/uL (ref 0.0–0.2)
Basos: 0 %
EOS (ABSOLUTE): 0.1 10*3/uL (ref 0.0–0.4)
Eos: 3 %
HEMATOCRIT: 35.7 % (ref 34.0–46.6)
Hemoglobin: 11.1 g/dL (ref 11.1–15.9)
IMMATURE GRANS (ABS): 0 10*3/uL (ref 0.0–0.1)
IMMATURE GRANULOCYTES: 0 %
LYMPHS: 42 %
Lymphocytes Absolute: 2.1 10*3/uL (ref 0.7–3.1)
MCH: 21.9 pg — ABNORMAL LOW (ref 26.6–33.0)
MCHC: 31.1 g/dL — ABNORMAL LOW (ref 31.5–35.7)
MCV: 70 fL — AB (ref 79–97)
MONOCYTES: 12 %
Monocytes Absolute: 0.6 10*3/uL (ref 0.1–0.9)
NEUTROS ABS: 2.2 10*3/uL (ref 1.4–7.0)
NEUTROS PCT: 43 %
Platelets: 246 10*3/uL (ref 150–379)
RBC: 5.08 x10E6/uL (ref 3.77–5.28)
RDW: 18.8 % — ABNORMAL HIGH (ref 12.3–15.4)
WBC: 5 10*3/uL (ref 3.4–10.8)

## 2016-11-24 LAB — VITAMIN D 25 HYDROXY (VIT D DEFICIENCY, FRACTURES): VIT D 25 HYDROXY: 25.8 ng/mL — AB (ref 30.0–100.0)

## 2016-11-24 NOTE — Telephone Encounter (Signed)
Patient called the office to request refill on her Vitamin D. Please call rx to [facility] on [address]  Thank you.

## 2016-11-24 NOTE — Telephone Encounter (Signed)
Patient following up on refill of Vitamin D. Would like to be notified when it has been sent to the pharmacy--[facility] on [address]  Patient also states that the ramelteon (ROZEREM) 8 MG tablet medication is not covered through [facility] and would like to know if an alternative medication can be prescribed

## 2016-11-25 LAB — CERVICOVAGINAL ANCILLARY ONLY
Chlamydia: NEGATIVE
HPV: NOT DETECTED
Neisseria Gonorrhea: NEGATIVE
Trichomonas: NEGATIVE

## 2016-11-25 NOTE — Telephone Encounter (Signed)
Please advise on an alternative for ramelteon and if a vitamin d refill is appropriate.

## 2016-11-26 ENCOUNTER — Other Ambulatory Visit: Payer: Self-pay | Admitting: Physician Assistant

## 2016-11-26 DIAGNOSIS — E559 Vitamin D deficiency, unspecified: Secondary | ICD-10-CM

## 2016-11-28 LAB — CYTOLOGY - PAP: DIAGNOSIS: NEGATIVE

## 2016-11-30 NOTE — Telephone Encounter (Signed)
-----   Message from Quentin Angst, MD sent at 11/30/2016  8:57 AM EDT ----- Please inform patient that her pap smear is negative for malignancy and no infection detected. HIV Negative as of 11/24/2016. Thyroid level is still high and may be affecting ability to conceive, please continue current medications, when thyroid hormone and function improves, others should improve as well.

## 2016-12-02 NOTE — Telephone Encounter (Signed)
Patient verified DOB Patient is aware of pap being negative for malignancy and STI's Patient is aware of thyroid function being high and still affecting her ability to conceive. Patient advised to adhere to medications and once her levels are controlled everything else will improve as well. Patient expressed her understanding.

## 2016-12-06 NOTE — Progress Notes (Signed)
Sarah Fritz, is a 36 y.o. female  JDB:[id number]  KPQ:[id number]  DOB - Jun 01, 1981  Chief Complaint  Patient presents with  . Gynecologic Exam       Subjective:   Sarah Fritz is a 36 y.o. female recently diagnosed with hyperthyroidism on Methimazole and Propranolol here today for Pap Smear. She complained of unable to sleep well at night, usually sleep for less than 4 hours, mostly unable to initiate sleep, and when she did, she sleeps for few hours and then wake up, sometimes in the middle of the night. She is here with her husband, they have been trying to conceive for over a year but to no avail. She was recently started on Methimazole and propranolol for Grave's Disease and she follows up with Endocrinologist. Patient has No headache, No chest pain, No abdominal pain - No Nausea, No new weakness tingling or numbness, No Cough - SOB.  No problems updated.  ALLERGIES: Allergies  Allergen Reactions  . Percocet [Oxycodone-Acetaminophen] Nausea And Vomiting and Other (See Comments)    Sweating, pass out  . Vicodin [Hydrocodone-Acetaminophen] Nausea And Vomiting and Other (See Comments)    Sweating, pass out    PAST MEDICAL HISTORY: Past Medical History:  Diagnosis Date  . Abscess   . Anemia   . Chlamydia   . Depression   . Gonorrhea     MEDICATIONS AT HOME: Prior to Admission medications   Medication Sig Start Date End Date Taking? Authorizing Provider  methimazole (TAPAZOLE) 10 MG tablet Take 15 mg by mouth daily. 07/27/16  Yes Historical Provider, MD  naproxen (NAPROSYN) 500 MG tablet Take 1 tablet (500 mg total) by mouth 2 (two) times daily with a meal. 07/14/16  Yes Deseray Daponte E Doreene Burke, MD  propranolol (INDERAL) 20 MG tablet Take 20 mg by mouth 3 (three) times daily. 07/27/16  Yes Historical Provider, MD  ramelteon (ROZEREM) 8 MG tablet Take 1 tablet (8 mg total) by mouth at bedtime. 11/23/16   Tresa Garter, MD    Objective:   Vitals:   11/23/16 1528  BP: (!) 146/88  Pulse: 81  Resp: 18  Temp: 98.7 F (37.1 C)  TempSrc: Oral  SpO2: 100%  Weight: 106 lb (48.1 kg)  Height: _0  (1.626 m)   Exam General appearance : Awake, alert, not in any distress. Speech Clear. Not toxic looking HEENT: Atraumatic and Normocephalic, pupils equally reactive to light and accomodation Neck: Supple, no JVD. No cervical lymphadenopathy.  Chest: Good air entry bilaterally, no added sounds  CVS: S1 S2 regular, no murmurs.  Abdomen: Bowel sounds present, Non tender and not distended with no gaurding, rigidity or rebound. Extremities: B/L Lower Ext shows no edema, both legs are warm to touch Neurology: Awake alert, and oriented X 3, CN II-XII intact, Non focal Skin: No Rash  Pelvic Exam: Cervix normal in appearance, external genitalia normal, no adnexal masses or tenderness, no cervical motion tenderness, rectovaginal septum normal, uterus normal size, shape, and consistency and vagina normal without discharge   Assessment & Plan   1. Hyperthyroidism  - CBC with Differential/Platelet - CMP14+EGFR - TSH - T4, Free - Continue current dose of Methimazole and Propranolol - Follow up with Endocrinologist   2. Chronic pain syndrome  - VITAMIN D 25 Hydroxy (Vit-D Deficiency, Fractures)  3. Pap smear for cervical cancer screening  - Cytology - PAP - Cervicovaginal ancillary only - HIV antibody (with reflex) - Please report immediately if you become pregnant so Methimazole can be  discontinued and substituted with PTU. Patient verbalized understanding.  4. Smoking  Sarah Fritz was counseled on the dangers of tobacco use, and was advised to quit. Reviewed strategies to maximize success, including removing cigarettes and smoking materials from environment, stress management and support of family/friends.   5. Primary insomnia  - ramelteon (ROZEREM) 8 MG tablet; Take 1 tablet (8 mg total) by mouth at bedtime.  Dispense: 30 tablet; Refill:  0  Patient have been counseled extensively about nutrition and exercise. Other issues discussed during this visit include: low cholesterol diet, weight control and daily exercise.   Return in about 3 months (around 02/22/2017) for Hyperthyroidism.  The patient was given clear instructions to go to ER or return to medical center if symptoms don't improve, worsen or new problems develop. The patient verbalized understanding. The patient was told to call to get lab results if they haven't heard anything in the next week.   This note has been created with Surveyor, quantity. Any transcriptional errors are unintentional.    Angelica Chessman, MD, [place], Karilyn Cota, [place] and [facility] [place], [place]   12/06/2016, 11:26 AM

## 2017-06-20 ENCOUNTER — Emergency Department (HOSPITAL_COMMUNITY): Payer: Self-pay

## 2017-06-20 ENCOUNTER — Encounter (HOSPITAL_COMMUNITY): Payer: Self-pay | Admitting: Emergency Medicine

## 2017-06-20 ENCOUNTER — Other Ambulatory Visit: Payer: Self-pay

## 2017-06-20 ENCOUNTER — Emergency Department (HOSPITAL_COMMUNITY)
Admission: EM | Admit: 2017-06-20 | Discharge: 2017-06-20 | Disposition: A | Discharge status: Home / Self Care | Payer: Self-pay | Attending: Emergency Medicine | Admitting: Emergency Medicine

## 2017-06-20 DIAGNOSIS — J189 Pneumonia, unspecified organism: Secondary | ICD-10-CM

## 2017-06-20 DIAGNOSIS — J181 Lobar pneumonia, unspecified organism: Secondary | ICD-10-CM | POA: Insufficient documentation

## 2017-06-20 DIAGNOSIS — E0590 Thyrotoxicosis, unspecified without thyrotoxic crisis or storm: Secondary | ICD-10-CM | POA: Insufficient documentation

## 2017-06-20 DIAGNOSIS — F17210 Nicotine dependence, cigarettes, uncomplicated: Secondary | ICD-10-CM | POA: Insufficient documentation

## 2017-06-20 HISTORY — DX: Thyrotoxicosis with diffuse goiter without thyrotoxic crisis or storm: E05.00

## 2017-06-20 LAB — CBC WITH DIFFERENTIAL/PLATELET
Basophils Absolute: 0 10*3/uL (ref 0.0–0.1)
Basophils Relative: 0 %
EOS PCT: 1 %
Eosinophils Absolute: 0.1 10*3/uL (ref 0.0–0.7)
HCT: 31.1 % — ABNORMAL LOW (ref 36.0–46.0)
HEMOGLOBIN: 10.4 g/dL — AB (ref 12.0–15.0)
Lymphocytes Relative: 24 %
Lymphs Abs: 2.1 10*3/uL (ref 0.7–4.0)
MCH: 23.1 pg — AB (ref 26.0–34.0)
MCHC: 33.4 g/dL (ref 30.0–36.0)
MCV: 69 fL — AB (ref 78.0–100.0)
MONO ABS: 1 10*3/uL (ref 0.1–1.0)
Monocytes Relative: 11 %
NEUTROS PCT: 64 %
Neutro Abs: 5.5 10*3/uL (ref 1.7–7.7)
Platelets: 223 10*3/uL (ref 150–400)
RBC: 4.51 MIL/uL (ref 3.87–5.11)
RDW: 14.2 % (ref 11.5–15.5)
WBC: 8.7 10*3/uL (ref 4.0–10.5)

## 2017-06-20 LAB — COMPREHENSIVE METABOLIC PANEL
ALBUMIN: 3.2 g/dL — AB (ref 3.5–5.0)
ALT: 29 U/L (ref 14–54)
AST: 27 U/L (ref 15–41)
Alkaline Phosphatase: 74 U/L (ref 38–126)
Anion gap: 7 (ref 5–15)
BUN: 10 mg/dL (ref 6–20)
CHLORIDE: 107 mmol/L (ref 101–111)
CO2: 28 mmol/L (ref 22–32)
CREATININE: 0.54 mg/dL (ref 0.44–1.00)
Calcium: 9.2 mg/dL (ref 8.9–10.3)
GFR calc non Af Amer: >60 mL/min (ref >60)
Glucose, Bld: 108 mg/dL — ABNORMAL HIGH (ref 65–99)
Potassium: 3.9 mmol/L (ref 3.5–5.1)
SODIUM: 142 mmol/L (ref 135–145)
Total Bilirubin: 0.9 mg/dL (ref 0.3–1.2)
Total Protein: 6.5 g/dL (ref 6.5–8.1)

## 2017-06-20 LAB — I-STAT BETA HCG BLOOD, ED (MC, WL, AP ONLY): I-stat hCG, quantitative: <5 m[IU]/mL (ref <5)

## 2017-06-20 LAB — I-STAT CG4 LACTIC ACID, ED: LACTIC ACID, VENOUS: 1.14 mmol/L (ref 0.5–1.9)

## 2017-06-20 LAB — URINALYSIS, ROUTINE W REFLEX MICROSCOPIC
BACTERIA UA: NONE SEEN
Bilirubin Urine: NEGATIVE
Glucose, UA: NEGATIVE mg/dL
Hgb urine dipstick: NEGATIVE
KETONES UR: NEGATIVE mg/dL
Leukocytes, UA: NEGATIVE
Nitrite: NEGATIVE
PROTEIN: 30 mg/dL — AB
Specific Gravity, Urine: 1.021 (ref 1.005–1.030)
pH: 8 (ref 5.0–8.0)

## 2017-06-20 LAB — T4, FREE: Free T4: 3.71 ng/dL — ABNORMAL HIGH (ref 0.61–1.12)

## 2017-06-20 LAB — TSH

## 2017-06-20 MED ORDER — PROPRANOLOL HCL 1 MG/ML IV SOLN
1.0000 mg | Freq: Once | INTRAVENOUS | Status: DC
  Filled 2017-06-20: qty 1

## 2017-06-20 MED ORDER — PROPRANOLOL HCL 1 MG/ML IV SOLN
0.5000 mg | Freq: Once | INTRAVENOUS | Status: AC
  Administered 2017-06-20: 0.5 mg via INTRAVENOUS
  Filled 2017-06-20: qty 0.5

## 2017-06-20 MED ORDER — PROPRANOLOL HCL 20 MG PO TABS: 20.0000 mg | ORAL_TABLET | Freq: Three times a day (TID) | ORAL | 1 refills | Status: DC

## 2017-06-20 MED ORDER — DEXTROSE 5 % IV SOLN
1.0000 g | Freq: Once | INTRAVENOUS | Status: AC
  Administered 2017-06-20: 1 g via INTRAVENOUS
  Filled 2017-06-20: qty 10

## 2017-06-20 MED ORDER — SODIUM CHLORIDE 0.9 % IV BOLUS (SEPSIS)
1000.0000 mL | Freq: Once | INTRAVENOUS | Status: AC
  Administered 2017-06-20: 1000 mL via INTRAVENOUS

## 2017-06-20 MED ORDER — PROPRANOLOL HCL 20 MG PO TABS
20.0000 mg | ORAL_TABLET | Freq: Once | ORAL | Status: AC
  Administered 2017-06-20: 20 mg via ORAL
  Filled 2017-06-20: qty 1

## 2017-06-20 MED ORDER — DEXTROSE 5 % IV SOLN
500.0000 mg | Freq: Once | INTRAVENOUS | Status: AC
  Administered 2017-06-20: 500 mg via INTRAVENOUS
  Filled 2017-06-20: qty 500

## 2017-06-20 MED ORDER — AZITHROMYCIN 250 MG PO TABS: 250.0000 mg | ORAL_TABLET | Freq: Every day | ORAL | 0 refills | Status: DC

## 2017-06-20 NOTE — Discharge Instructions (Signed)
Your chest xray shows evidence of pneumonia. Please take all of your antibiotics until finished!   You may develop abdominal discomfort or diarrhea from the antibiotic.  You may help offset this with probiotics which you can buy or get in yogurt. Do not eat or take the probiotics until 2 hours after your antibiotic.   Please begin taking the propranolol again.  You will need to follow-up with a primary care provider as well as an endocrinologist on this matter.  Please return to the ED for any worsening symptoms, to include shortness of breath, chest pain, dizziness, agitation, confusion, seizures, continuous vomiting, or any other major concerns.

## 2017-06-20 NOTE — ED Triage Notes (Signed)
Pt complaint of productive cough, sore throat, fever, and headache ongoing for 3 weeks.

## 2017-06-20 NOTE — Care Management Note (Signed)
Case Management Note  CM consulted for medication assistance, PCP needs, and specialist needs.  CM reviewed chart and noted pt is already active with [facility] where pt sees a PCP and also has access to pharmacy and financial counseling resources.  Information for PCP placed on AVS.  Updated Joy, PA.  Spoke with pt and reminded them to contact their PCP and informed her of resources available at her PCP office.  No further CM needs noted at this time.

## 2017-06-20 NOTE — ED Notes (Signed)
Pt had drawn for labs:  Red Gold Blue Lavender Lt green Dark green x2 

## 2017-06-20 NOTE — ED Provider Notes (Signed)
  Physical Exam  BP 116/82 (BP Location: Left Arm)   Pulse (!) 104   Temp 98.2 F (36.8 C) (Oral) Comment: took fever reducer at 1000  Resp 16   Ht 5\' 4"  (1.626 m)   Wt 47.2 kg (104 lb)   LMP 06/15/2017   SpO2 100%   BMI 17.85 kg/m   Physical Exam  ED Course  Procedures  4:28 PM Sign out from Harolyn RutherfordShawn Joy, [place]PA-C  Per previous provider MDM: Patient presents with complaint of a cough. Patient is nontoxic appearing, afebrile, not tachypneic, not hypotensive, maintains SPO2 of 100% on room air, and is in no apparent distress.  Patient also has symptoms consistent with symptomatic hyperthyroidism.  However, my suspicion for thyroid storm is low.  Patient has no central nervous system no disruption, no GI upset/dysfunction, no signs of heart failure, is afebrile, and is only mildly tachycardic.  Given Propranol and observe in ED.  5:52 PM- HR around 100-105. Given PO propranolol.   Will DC patient home with follow up to PCP. Case Management has seen and assisted. At time of discharge, Patient is in no acute distress. Vital Signs are stable. Patient is able to ambulate. Patient able to tolerate PO.        Audry PiliMohr, Mrk Buzby, PA-C 06/20/17 1753    Mancel BaleWentz, Elliott, MD 06/21/17 2136

## 2017-06-20 NOTE — ED Provider Notes (Signed)
[facility]-EMERGENCY DEPT Provider Note   CSN: [id number] Arrival date & time: 06/20/17  1214     History   Chief Complaint Chief Complaint  Patient presents with  . Cough    HPI Sarah Fritz is a 36 y.o. female.  HPI   Sarah Fritz is a 36 y.o. female, with a history of Graves' disease and anemia, presenting to the ED with a productive cough for three weeks intermittent for three weeks.  Intermittent fevers with Tmax 102F, headache, night sweats, heat intolerance, anorexia, and palpitations for last month.  Headache is bilateral occipital, pressure, 10/10, nonradiating. States, "I have these headaches when my thyroid is abnormal."  Denies N/V/D, chest pain, shortness of breath, abdominal pain, dizziness, or any other complaints.  Last took her propranolol two months ago due to loss of medical insurance.    Past Medical History:  Diagnosis Date  . Abscess   . Anemia   . Chlamydia   . Depression   . Gonorrhea   . Graves disease     Patient Active Problem List   Diagnosis Date Noted  . Pap smear for cervical cancer screening 11/23/2016  . Hyperthyroidism 07/14/2016  . Chronic pain syndrome 07/14/2016  . Smoking 07/14/2016  . Chlamydia 04/18/2013  . Recurrent UTI 04/18/2013    Past Surgical History:  Procedure Laterality Date  . WISDOM TOOTH EXTRACTION      OB History    Gravida Para Term Preterm AB Living   1 1 1     1    SAB TAB Ectopic Multiple Live Births                   Home Medications    Prior to Admission medications   Medication Sig Start Date End Date Taking? Authorizing Provider  Aspirin-Salicylamide-Caffeine (BC HEADACHE PO) Take 1 packet 3 (three) times daily as needed by mouth (PAIN).   Yes [provider]  naproxen (NAPROSYN) 500 MG tablet Take 1 tablet (500 mg total) by mouth 2 (two) times daily with a meal. Patient not taking: Reported on 06/20/2017 07/14/16   Quentin AngstJegede, Olugbemiga E, MD    ramelteon (ROZEREM) 8 MG tablet Take 1 tablet (8 mg total) by mouth at bedtime. Patient not taking: Reported on 06/20/2017 11/23/16   Quentin AngstJegede, Olugbemiga E, MD    Family History Family History  Problem Relation Age of Onset  . Heart disease Mother   . Kidney disease Mother   . COPD Mother   . Cancer Mother   . Cancer Father   . COPD Father     Social History Social History   Tobacco Use  . Smoking status: Current Every Day Smoker    Packs/day: 0.50    Types: Cigarettes  . Smokeless tobacco: Current User  Substance Use Topics  . Alcohol use: No    Comment: occasional  . Drug use: Yes    Types: Marijuana    Comment: Last marijuana 09/01/15     Allergies   Percocet [oxycodone-acetaminophen] and Vicodin [hydrocodone-acetaminophen]   Review of Systems Review of Systems  Constitutional: Positive for appetite change, diaphoresis and fever.  HENT: Negative for trouble swallowing.   Eyes: Negative for visual disturbance.  Respiratory: Positive for cough. Negative for shortness of breath.   Endocrine: Positive for heat intolerance.  Neurological: Positive for headaches.  All other systems reviewed and are negative.    Physical Exam Updated Vital Signs BP (!) 128/100 (BP Location: Left Arm)  Pulse (!) 111   Temp 98.2 F (36.8 C) (Oral) Comment: took fever reducer at 1000  Resp 16   Ht 5\' 4"  (1.626 m)   Wt 47.2 kg (104 lb)   LMP 06/15/2017   SpO2 98%   BMI 17.85 kg/m   Physical Exam  Constitutional: She appears well-developed and well-nourished. No distress.  HENT:  Head: Normocephalic and atraumatic.  Eyes: Conjunctivae are normal.  Neck: Neck supple.  Cardiovascular: Regular rhythm, normal heart sounds and intact distal pulses. Tachycardia present.  Mildly tachycardic  Pulmonary/Chest: Effort normal and breath sounds normal. No respiratory distress.  Patient shows no increased work of breathing.  Speaks in full sentences without difficulty.  Abdominal:  Soft. There is no tenderness. There is no guarding.  Musculoskeletal: She exhibits no edema.  Lymphadenopathy:    She has no cervical adenopathy.  Neurological: She is alert.  No sensory deficits.  No noted speech deficits. No aphasia. Patient handles oral secretions without difficulty. No noted swallowing defects.  Equal grip strength bilaterally. Strength 5/5 in the upper extremities. Strength 5/5 with flexion and extension of the hips, knees, and ankles bilaterally.  Patellar DTRs 2+ bilaterally. No clonus. Negative Romberg. No gait disturbance.  Coordination intact including heel to shin and finger to nose.  Cranial nerves III-XII grossly intact.  No facial droop.   Skin: Skin is warm and dry. Capillary refill takes less than 2 seconds. She is not diaphoretic.  Psychiatric: She has a normal mood and affect. Her behavior is normal.  Nursing note and vitals reviewed.    ED Treatments / Results  Labs (all labs ordered are listed, but only abnormal results are displayed) Labs Reviewed  COMPREHENSIVE METABOLIC PANEL - Abnormal; Notable for the following components:      Result Value   Glucose, Bld 108 (*)    Albumin 3.2 (*)    All other components within normal limits  CBC WITH DIFFERENTIAL/PLATELET - Abnormal; Notable for the following components:   Hemoglobin 10.4 (*)    HCT 31.1 (*)    MCV 69.0 (*)    MCH 23.1 (*)    All other components within normal limits  URINALYSIS, ROUTINE W REFLEX MICROSCOPIC - Abnormal; Notable for the following components:   Protein, ur 30 (*)    Squamous Epithelial / LPF 0-5 (*)    All other components within normal limits  TSH - Abnormal; Notable for the following components:   TSH <0.010 (*)    All other components within normal limits  T4, FREE  I-STAT CG4 LACTIC ACID, ED  I-STAT BETA HCG BLOOD, ED ([facility], [facility], [facility] ONLY)    EKG  EKG Interpretation None       Radiology Dg Chest 2 View  Result Date: 06/20/2017 CLINICAL DATA:   Productive cough, fever and sore throat. EXAM: CHEST  2 VIEW COMPARISON:  05/24/2015 FINDINGS: Cardiomediastinal silhouette is normal. Mediastinal contours appear intact. There is no evidence of focal pleural effusion or pneumothorax. Patchy airspace consolidation in the right middle lobe. Osseous structures are without acute abnormality. Soft tissues are grossly normal. IMPRESSION: Patchy airspace consolidation in the right middle lobe. In the settings of acute symptomatology this likely represents a lobar pneumonia. Please follow to resolution after empiric treatment. Electronically Signed   By: Ted Mcalpine M.D.   On: 06/20/2017 16:08    Procedures Procedures (including critical care time)  Medications Ordered in ED Medications  cefTRIAXone (ROCEPHIN) 1 g in dextrose 5 % 50 mL IVPB (1 g  Intravenous New Bag/Given 06/20/17 1633)  azithromycin (ZITHROMAX) 500 mg in dextrose 5 % 250 mL IVPB (500 mg Intravenous New Bag/Given 06/20/17 1633)  sodium chloride 0.9 % bolus 1,000 mL (1,000 mLs Intravenous New Bag/Given 06/20/17 1541)  propranolol (INDERAL) injection 0.5 mg (0.5 mg Intravenous Given 06/20/17 1635)     Initial Impression / Assessment and Plan / ED Course  I have reviewed the triage vital signs and the nursing notes.  Pertinent labs & imaging results that were available during my care of the patient were reviewed by me and considered in my medical decision making (see chart for details).      Patient presents with complaint of a cough. Patient is nontoxic appearing, afebrile, not tachypneic, not hypotensive, maintains SPO2 of 100% on room air, and is in no apparent distress.  Patient also has symptoms consistent with symptomatic hyperthyroidism.  However, my suspicion for thyroid storm is low.  Patient has no central nervous system no disruption, no GI upset/dysfunction, no signs of heart failure, is afebrile, and is only mildly tachycardic.  End of shift patient care handoff  report given to Audry Piliyler Mohr, PA-C. Plan: Patient receiving a dose of IV propranolol.  Monitor patient for at least an hour following this administration.  Patient can have a dose of PO propranolol and then be observed for another hour following this.  Case management consult to assist patient with securing PCP, endocrinologist, and to assist with obtaining medications, such as propranolol.  Findings and plan of care discussed with Mancel BaleElliott Wentz, MD.   Vitals:   06/20/17 1236 06/20/17 1444 06/20/17 1538 06/20/17 1635  BP:  117/88 116/82 117/77  Pulse:  97 (!) 104 (!) 102  Resp:  16 16 18   Temp:      TempSrc:      SpO2:  100% 100% 100%  Weight: 47.2 kg (104 lb)     Height: 5\' 4"  (1.626 m)        Final Clinical Impressions(s) / ED Diagnoses   Final diagnoses:  Community acquired pneumonia of right middle lobe of lung (HCC)  Hyperthyroidism without crisis    ED Discharge Orders    None       Concepcion LivingJoy, Lexie Morini C, PA-C 06/20/17 1654    Mancel BaleWentz, Elliott, MD 06/21/17 2135

## 2017-06-21 MED FILL — PROPRANOLOL 20 MG TABLET: 20 | 30 days supply | Qty: 90 | Fill #0

## 2017-06-21 MED FILL — AZITHROMYCIN 250 MG TABLET: 250 | 5 days supply | Qty: 6 | Fill #0

## 2017-06-28 ENCOUNTER — Encounter: Payer: Self-pay | Admitting: Critical Care Medicine

## 2017-06-28 ENCOUNTER — Ambulatory Visit: Payer: Self-pay | Attending: Critical Care Medicine | Admitting: Critical Care Medicine

## 2017-06-28 VITALS — BP 146/82 | HR 107 | Temp 98.0°F | Resp 18 | Ht 64.0 in | Wt 101.8 lb

## 2017-06-28 DIAGNOSIS — Z7982 Long term (current) use of aspirin: Secondary | ICD-10-CM | POA: Insufficient documentation

## 2017-06-28 DIAGNOSIS — F329 Major depressive disorder, single episode, unspecified: Secondary | ICD-10-CM | POA: Insufficient documentation

## 2017-06-28 DIAGNOSIS — E0500 Thyrotoxicosis with diffuse goiter without thyrotoxic crisis or storm: Secondary | ICD-10-CM | POA: Insufficient documentation

## 2017-06-28 DIAGNOSIS — E0590 Thyrotoxicosis, unspecified without thyrotoxic crisis or storm: Secondary | ICD-10-CM

## 2017-06-28 DIAGNOSIS — F17210 Nicotine dependence, cigarettes, uncomplicated: Secondary | ICD-10-CM | POA: Insufficient documentation

## 2017-06-28 DIAGNOSIS — F17200 Nicotine dependence, unspecified, uncomplicated: Secondary | ICD-10-CM

## 2017-06-28 DIAGNOSIS — J189 Pneumonia, unspecified organism: Secondary | ICD-10-CM | POA: Insufficient documentation

## 2017-06-28 DIAGNOSIS — Z791 Long term (current) use of non-steroidal anti-inflammatories (NSAID): Secondary | ICD-10-CM | POA: Insufficient documentation

## 2017-06-28 DIAGNOSIS — R0600 Dyspnea, unspecified: Secondary | ICD-10-CM | POA: Insufficient documentation

## 2017-06-28 DIAGNOSIS — Z79899 Other long term (current) drug therapy: Secondary | ICD-10-CM | POA: Insufficient documentation

## 2017-06-28 DIAGNOSIS — J181 Lobar pneumonia, unspecified organism: Secondary | ICD-10-CM

## 2017-06-28 DIAGNOSIS — Z8249 Family history of ischemic heart disease and other diseases of the circulatory system: Secondary | ICD-10-CM | POA: Insufficient documentation

## 2017-06-28 DIAGNOSIS — Z825 Family history of asthma and other chronic lower respiratory diseases: Secondary | ICD-10-CM | POA: Insufficient documentation

## 2017-06-28 DIAGNOSIS — Z885 Allergy status to narcotic agent status: Secondary | ICD-10-CM | POA: Insufficient documentation

## 2017-06-28 MED ORDER — METHIMAZOLE 10 MG PO TABS: 15.0000 mg | ORAL_TABLET | Freq: Every day | ORAL | 3 refills | Status: DC

## 2017-06-28 MED FILL — methIMAzole 10 MG TABS: 10 | 30 days supply | Qty: 45 | Fill #0

## 2017-06-28 NOTE — Assessment & Plan Note (Signed)
RML PNA resolved No further ABX

## 2017-06-28 NOTE — Assessment & Plan Note (Signed)
Ongoing tobacco use 

## 2017-06-28 NOTE — Progress Notes (Signed)
Subjective:    Patient ID: Sarah Fritz, female    DOB: 06-19-1981, 36 y.o.   MRN: [id number]  36 y.o. F with hyperthyroidism and associated RML CAP NOS seen 11/6.  Rx propanolol, ABX and released.   [facility] and propanolol.   Since d/c from ED:  Still with dyspnea at night No thyroid meds for two months d/t insurance issues.   Thyroid issues:  Nightsweats, throat sore, heart palpitates, hot flashes, no visual changes   Shortness of Breath  This is a chronic problem. The current episode started more than 1 year ago. The problem occurs daily (issue breathing at night if flat, also exertional dyspnea). The problem has been unchanged. Associated symptoms include ear pain, headaches, PND and a sore throat. Pertinent negatives include no chest pain, fever, hemoptysis, leg pain, leg swelling, neck pain, orthopnea, rash, rhinorrhea, sputum production, swollen glands, syncope, vomiting or wheezing. The symptoms are aggravated by any activity and lying flat. Risk factors include smoking. Her past medical history is significant for pneumonia. There is no history of asthma, CAD, DVT, a heart failure or PE.  Thyroid Problem  Presents for follow-up visit. Symptoms include anxiety, diaphoresis, heat intolerance, palpitations, tremors and weight loss. Patient reports no visual change or weight gain. (No access to methimizole) The symptoms have been worsening. There is no history of heart failure.   Past Medical History:  Diagnosis Date  . Abscess   . Anemia   . Chlamydia   . Depression   . Gonorrhea   . Graves disease      Family History  Problem Relation Age of Onset  . Heart disease Mother   . Kidney disease Mother   . COPD Mother   . Cancer Mother   . Cancer Father   . COPD Father      Social History   Socioeconomic History  . Marital status: Divorced    Spouse name: Not on file  . Number of children: Not on file  .  Years of education: Not on file  . Highest education level: Not on file  Social Needs  . Financial resource strain: Not on file  . Food insecurity - worry: Not on file  . Food insecurity - inability: Not on file  . Transportation needs - medical: Not on file  . Transportation needs - non-medical: Not on file  Occupational History  . Not on file  Tobacco Use  . Smoking status: Current Every Day Smoker    Packs/day: 0.50    Types: Cigarettes  . Smokeless tobacco: Current User  Substance and Sexual Activity  . Alcohol use: No    Comment: occasional  . Drug use: Yes    Types: Marijuana    Comment: Last marijuana 09/01/15  . Sexual activity: Yes    Birth control/protection: None  Other Topics Concern  . Not on file  Social History Narrative  . Not on file     Allergies  Allergen Reactions  . Percocet [Oxycodone-Acetaminophen] Nausea And Vomiting and Other (See Comments)    Sweating, pass out  . Vicodin [Hydrocodone-Acetaminophen] Nausea And Vomiting and Other (See Comments)    Sweating, pass out     Outpatient Medications Prior to Visit  Medication Sig Dispense Refill  . Aspirin-Salicylamide-Caffeine (BC HEADACHE PO) Take 1 packet 3 (three) times daily as needed by mouth (PAIN).    Marland Kitchen. propranolol (INDERAL) 20 MG tablet Take 1 tablet (  20 mg total) 3 (three) times daily by mouth. 90 tablet 1  . azithromycin (ZITHROMAX) 250 MG tablet Take 1 tablet (250 mg total) daily by mouth. Take first 2 tablets together, then 1 every day until finished. (Patient not taking: Reported on 06/28/2017) 6 tablet 0  . naproxen (NAPROSYN) 500 MG tablet Take 1 tablet (500 mg total) by mouth 2 (two) times daily with a meal. (Patient not taking: Reported on 06/20/2017) 60 tablet 0  . ramelteon (ROZEREM) 8 MG tablet Take 1 tablet (8 mg total) by mouth at bedtime. (Patient not taking: Reported on 06/20/2017) 30 tablet 0   No facility-administered medications prior to visit.       Review of Systems    Constitutional: Positive for diaphoresis and weight loss. Negative for fever and weight gain.  HENT: Positive for ear pain and sore throat. Negative for rhinorrhea.   Respiratory: Positive for shortness of breath. Negative for hemoptysis, sputum production and wheezing.   Cardiovascular: Positive for palpitations and PND. Negative for chest pain, orthopnea, leg swelling and syncope.  Gastrointestinal: Negative for vomiting.  Endocrine: Positive for heat intolerance.  Musculoskeletal: Negative for neck pain.  Skin: Negative for rash.  Neurological: Positive for tremors and headaches.  Psychiatric/Behavioral: The patient is nervous/anxious.        Objective:   Physical Exam Vitals:   06/28/17 1128  BP: (!) 146/82  Pulse: (!) 107  Resp: 18  Temp: 98 F (36.7 C)  TempSrc: Oral  SpO2: 100%  Weight: 101 lb 12.8 oz (46.2 kg)  Height: 5\' 4"  (1.626 m)    Gen: Pleasant, well-nourished, in no distress,  normal affect  ENT: No lesions,  mouth clear,  oropharynx clear, no postnasal drip, prominent orbits  Neck: No JVD, no TMG, no carotid bruits  Lungs: No use of accessory muscles, no dullness to percussion, clear without rales or rhonchi  Cardiovascular:mild tachycardia, heart sounds normal, no murmur or gallops, no peripheral edema  Abdomen: soft and NT, no HSM,  BS normal  Musculoskeletal: No deformities, no cyanosis or clubbing  Neuro: alert, non focal  Skin: Warm, no lesions or rashes  No results found.  11/6 CXR: patchy RML infiltrate  11/6 CMET/CBC OK    Assessment & Plan:  I personally reviewed all images and lab data [facility] as well as any outside material available during this office visit and agree with the  radiology impressions.   Hyperthyroidism Hyperthyroidism d/t graves dz Needs endocrine f/u and methimizole refilled Referral to Dr Debara PickettJeff Kerr [facility]  CAP (community acquired pneumonia) RML PNA resolved No further ABX   Graves  disease Graves dz See hyperthyroid assessment  Smoking Ongoing tobacco use   Diagnoses and all orders for this visit:  Community acquired pneumonia of right middle lobe of lung (HCC)  Hyperthyroidism -     Ambulatory referral to Endocrinology  Graves disease -     Ambulatory referral to Endocrinology  Smoking  Other orders -     methimazole (TAPAZOLE) 10 MG tablet; Take 1.5 tablets (15 mg total) daily by mouth.   I spent a few minutes on smoking cessation counseling.

## 2017-06-28 NOTE — Assessment & Plan Note (Signed)
Hyperthyroidism d/t graves dz Needs endocrine f/u and methimizole refilled Referral to Dr Debara PickettJeff Kerr [facility]

## 2017-06-28 NOTE — Patient Instructions (Addendum)
Follow up with Dr Hyman HopesJegede An endocrine referral will be made Resume Methimizole , take daily 1 and 1/2 tablet  Stay on Propanolol No more antibiotics needed Keep the appointment with orange card

## 2017-06-28 NOTE — Assessment & Plan Note (Signed)
Graves dz See hyperthyroid assessment

## 2017-06-30 ENCOUNTER — Ambulatory Visit: Payer: Self-pay

## 2017-07-19 ENCOUNTER — Ambulatory Visit: Payer: Self-pay | Admitting: Internal Medicine

## 2017-08-02 MED FILL — methIMAzole 10 MG TABS: 10 | 30 days supply | Qty: 45 | Fill #1

## 2017-08-30 MED FILL — PROPRANOLOL 20 MG TABLET: 20 | 30 days supply | Qty: 90 | Fill #1

## 2017-08-30 MED FILL — methIMAzole 10 MG TABS: 10 | 30 days supply | Qty: 45 | Fill #2

## 2017-10-09 MED FILL — methIMAzole 10 MG TABS: 10 | 30 days supply | Qty: 45 | Fill #3

## 2017-11-02 ENCOUNTER — Emergency Department (HOSPITAL_COMMUNITY): Payer: Self-pay

## 2017-11-02 ENCOUNTER — Encounter (HOSPITAL_COMMUNITY): Payer: Self-pay | Admitting: Emergency Medicine

## 2017-11-02 ENCOUNTER — Emergency Department (HOSPITAL_COMMUNITY)
Admission: EM | Admit: 2017-11-02 | Discharge: 2017-11-02 | Disposition: A | Discharge status: Home / Self Care | Payer: Self-pay | Attending: Emergency Medicine | Admitting: Emergency Medicine

## 2017-11-02 DIAGNOSIS — H11423 Conjunctival edema, bilateral: Secondary | ICD-10-CM | POA: Insufficient documentation

## 2017-11-02 DIAGNOSIS — R0602 Shortness of breath: Secondary | ICD-10-CM | POA: Insufficient documentation

## 2017-11-02 DIAGNOSIS — Z79899 Other long term (current) drug therapy: Secondary | ICD-10-CM | POA: Insufficient documentation

## 2017-11-02 DIAGNOSIS — R0981 Nasal congestion: Secondary | ICD-10-CM | POA: Insufficient documentation

## 2017-11-02 DIAGNOSIS — F17210 Nicotine dependence, cigarettes, uncomplicated: Secondary | ICD-10-CM | POA: Insufficient documentation

## 2017-11-02 DIAGNOSIS — E032 Hypothyroidism due to medicaments and other exogenous substances: Secondary | ICD-10-CM | POA: Insufficient documentation

## 2017-11-02 DIAGNOSIS — I10 Essential (primary) hypertension: Secondary | ICD-10-CM | POA: Insufficient documentation

## 2017-11-02 DIAGNOSIS — R040 Epistaxis: Secondary | ICD-10-CM | POA: Insufficient documentation

## 2017-11-02 HISTORY — DX: Essential (primary) hypertension: I10

## 2017-11-02 LAB — CBC
HCT: 30.3 % — ABNORMAL LOW (ref 36.0–46.0)
Hemoglobin: 9.9 g/dL — ABNORMAL LOW (ref 12.0–15.0)
MCH: 24.8 pg — ABNORMAL LOW (ref 26.0–34.0)
MCHC: 32.7 g/dL (ref 30.0–36.0)
MCV: 75.8 fL — ABNORMAL LOW (ref 78.0–100.0)
PLATELETS: 211 10*3/uL (ref 150–400)
RBC: 4 MIL/uL (ref 3.87–5.11)
RDW: 19.2 % — AB (ref 11.5–15.5)
WBC: 3.7 10*3/uL — AB (ref 4.0–10.5)

## 2017-11-02 LAB — I-STAT BETA HCG BLOOD, ED (MC, WL, AP ONLY)

## 2017-11-02 LAB — BASIC METABOLIC PANEL
Anion gap: 10 (ref 5–15)
BUN: 10 mg/dL (ref 6–20)
CALCIUM: 8.8 mg/dL — AB (ref 8.9–10.3)
CO2: 27 mmol/L (ref 22–32)
Chloride: 100 mmol/L — ABNORMAL LOW (ref 101–111)
Creatinine, Ser: 1.28 mg/dL — ABNORMAL HIGH (ref 0.44–1.00)
GFR calc Af Amer: >60 mL/min (ref >60)
GFR, EST NON AFRICAN AMERICAN: 53 mL/min — AB (ref >60)
Glucose, Bld: 81 mg/dL (ref 65–99)
Potassium: 3.7 mmol/L (ref 3.5–5.1)
SODIUM: 137 mmol/L (ref 135–145)

## 2017-11-02 LAB — T4, FREE: Free T4: <0.25 ng/dL — ABNORMAL LOW (ref 0.61–1.12)

## 2017-11-02 LAB — I-STAT TROPONIN, ED: TROPONIN I, POC: 0 ng/mL (ref 0.00–0.08)

## 2017-11-02 LAB — TSH: TSH: 84.651 u[IU]/mL — ABNORMAL HIGH (ref 0.350–4.500)

## 2017-11-02 MED ORDER — OLOPATADINE HCL 0.1 % OP SOLN
1.0000 [drp] | Freq: Two times a day (BID) | OPHTHALMIC | Status: DC
  Administered 2017-11-02: 1 [drp] via OPHTHALMIC
  Filled 2017-11-02: qty 5

## 2017-11-02 NOTE — ED Provider Notes (Signed)
[facility]   CSN: [id number] Arrival date & time: 11/02/17  0703     History   Chief Complaint Chief Complaint  Patient presents with  . Chest Pain  . eyes swollen    HPI Sarah Fritz is a 37 y.o. female.  HPI Sarah Fritz is a 37 y.o. female history of anemia, depression, Graves' disease, hypertension, presents to emergency department with complaint of chest pain, nasal congestion, shortness of breath, hoarse voice, nosebleed, puffy eyes.  Patient states that she started feeling like she was getting sick last night.  She states that she took some Alka-Seltzer p.m. before going to bed.  This morning she woke up with a hoarse voice and some sinus pressure.  She states she went to work and while at work she had a nosebleed.  States it made her worried so she came to the emergency department.  She reports that she has had puffy eyes since yesterday that are watery.  Denies any itching or pain.  She also states she has had chest pain that would come and go over the last week.  The chest pain is nonexertional.  At times feels short of breath.  No palpitations.  States that she is worried that her thyroid medicine is not helping, states last time she had it filled the pills looked different.  Nosebleed was stopped with pressure.  States had some chemicals sprayed at the house recently, thinks that's whats causing her eyes to be puffy. Does not wear contacts. No erythema. No other complaints.  Past Medical History:  Diagnosis Date  . Abscess   . Anemia   . Chlamydia   . Depression   . Gonorrhea   . Graves disease   . Hypertension     Patient Active Problem List   Diagnosis Date Noted  . Pap smear for cervical cancer screening 11/23/2016  . Graves disease 07/27/2016  . Hyperthyroidism 07/14/2016  . Chronic pain syndrome 07/14/2016  . Smoking 07/14/2016  . Chlamydia 04/18/2013  . Recurrent UTI 04/18/2013    Past  Surgical History:  Procedure Laterality Date  . WISDOM TOOTH EXTRACTION      OB History    Gravida  1   Para  1   Term  1   Preterm      AB      Living  1     SAB      TAB      Ectopic      Multiple      Live Births               Home Medications    Prior to Admission medications   Medication Sig Start Date End Date Taking? Authorizing Provider  aspirin-sod bicarb-citric acid (ALKA-SELTZER) 325 MG TBEF tablet Take 325 mg by mouth every 6 (six) hours as needed (for acid reflex).   Yes [provider]  diphenhydrAMINE (BENADRYL) 25 MG tablet Take 25 mg by mouth every 6 (six) hours as needed for allergies.   Yes [provider]  methimazole (TAPAZOLE) 10 MG tablet Take 1.5 tablets (15 mg total) daily by mouth. 06/28/17  Yes Storm FriskWright, Patrick E, MD  propranolol (INDERAL) 20 MG tablet Take 1 tablet (20 mg total) 3 (three) times daily by mouth. 06/20/17 11/02/17 Yes Joy, Hillard DankerShawn C, PA-C    Family History Family History  Problem Relation Age of Onset  . Heart disease Mother   . Kidney disease Mother   .  COPD Mother   . Cancer Mother   . Cancer Father   . COPD Father     Social History Social History   Tobacco Use  . Smoking status: Current Every Day Smoker    Packs/day: 0.50    Types: Cigarettes  . Smokeless tobacco: Current User  Substance Use Topics  . Alcohol use: No    Comment: occasional  . Drug use: Yes    Types: Marijuana    Comment: Last marijuana 09/01/15     Allergies   Percocet [oxycodone-acetaminophen] and Vicodin [hydrocodone-acetaminophen]   Review of Systems Review of Systems  Constitutional: Negative for chills and fever.  HENT: Positive for congestion and nosebleeds. Negative for sore throat.   Eyes: Positive for discharge. Negative for photophobia, pain, redness, itching and visual disturbance.       Positive for puffy eyes  Respiratory: Positive for chest tightness and shortness of breath. Negative for cough.     Cardiovascular: Positive for chest pain. Negative for palpitations and leg swelling.  Gastrointestinal: Negative for abdominal pain, diarrhea, nausea and vomiting.  Genitourinary: Negative for dysuria, flank pain, pelvic pain, vaginal bleeding, vaginal discharge and vaginal pain.  Musculoskeletal: Negative for arthralgias, myalgias, neck pain and neck stiffness.  Skin: Negative for rash.  Neurological: Negative for dizziness, weakness and headaches.  All other systems reviewed and are negative.    Physical Exam Updated Vital Signs BP 105/86   Pulse 62   Temp 97.8 F (36.6 C) (Oral)   Resp 14   LMP 10/10/2017   SpO2 97%   Physical Exam  Constitutional: She is oriented to person, place, and time. She appears well-developed and well-nourished. No distress.  HENT:  Head: Normocephalic.  Right Ear: External ear normal.  Left Ear: External ear normal.  Mouth/Throat: Oropharynx is clear and moist.  Patient has a perforated nasal septum with mild erythema around the perforated site.  No active bleeding.  Eyes: Conjunctivae are normal.  Mild swelling to the bilateral legs, with no erythema or tenderness.  Neck: Normal range of motion. Neck supple.  Cardiovascular: Normal rate, regular rhythm and normal heart sounds.  Pulmonary/Chest: Effort normal and breath sounds normal. No respiratory distress. She has no wheezes. She has no rales.  Abdominal: Soft. Bowel sounds are normal. She exhibits no distension. There is no tenderness. There is no rebound.  Musculoskeletal: She exhibits no edema.  Neurological: She is alert and oriented to person, place, and time.  Skin: Skin is warm and dry.  Psychiatric: She has a normal mood and affect. Her behavior is normal.  Nursing note and vitals reviewed.    ED Treatments / Results  Labs (all labs ordered are listed, but only abnormal results are displayed) Labs Reviewed  BASIC METABOLIC PANEL - Abnormal; Notable for the following components:       Result Value   Chloride 100 (*)    Creatinine, Ser 1.28 (*)    Calcium 8.8 (*)    GFR calc non Af Amer 53 (*)    All other components within normal limits  CBC - Abnormal; Notable for the following components:   WBC 3.7 (*)    Hemoglobin 9.9 (*)    HCT 30.3 (*)    MCV 75.8 (*)    MCH 24.8 (*)    RDW 19.2 (*)    All other components within normal limits  TSH  T4, FREE  I-STAT TROPONIN, ED  I-STAT BETA HCG BLOOD, ED ([facility], [facility], [facility] ONLY)  EKG  EKG Interpretation None       EKG Interpretation  Date/Time:  Thursday November 02 2017 07:14:52 EDT Ventricular Rate:  70 PR Interval:    QRS Duration: 106 QT Interval:  418 QTC Calculation: 451 R Axis:   78 Text Interpretation:  Sinus rhythm Borderline prolonged PR interval Low voltage, precordial leads Confirmed by Benjiman Core [phone]) on 11/02/2017 9:15:52 AM       Radiology Dg Chest 2 View  Result Date: 11/02/2017 CLINICAL DATA:  Chest pain EXAM: CHEST - 2 VIEW COMPARISON:  06/20/2017 chest radiograph. FINDINGS: Stable cardiomediastinal silhouette with normal heart size. No pneumothorax. Trace bilateral pleural effusions. No pulmonary edema. No acute consolidative airspace disease. IMPRESSION: Trace bilateral pleural effusions. Otherwise no active disease in the chest. Electronically Signed   By: Delbert Phenix M.D.   On: 11/02/2017 08:14    Procedures Procedures (including critical care time)  Medications Ordered in ED Medications  olopatadine (PATANOL) 0.1 % ophthalmic solution 1 drop (1 drop Both Eyes Given 11/02/17 0835)     Initial Impression / Assessment and Plan / ED Course  I have reviewed the triage vital signs and the nursing notes.  Pertinent labs & imaging results that were available during my care of the patient were reviewed by me and considered in my medical decision making (see chart for details).     Patient emergency department complaints.  She is complaining of nasal congestion and URI  symptoms since last night.  She is also had an nasal bleed which has stopped at this time.  She does have a perforated septum and admits to cocaine use years ago.  No bleeding at this time.  Also mild swelling around her eyelids, with no tenderness or erythema.  Normal extraocular movements.  Most consistent with allergies.  She is concerned about thyroid, will check TSH and free T4.  TSH 84. Spoke with Dr. Nelson Chimes, hostpitalist here. Advised to try contacting pt's old endocrinologist, also wanted to wait on T4. I spoke with lab, may take multiple hours to get free T4. I was able to get in touch with Dr. Allena Katz with endocrinology the patient has seen 2 years ago.  He advised me to lower dose of her methimazole to half a tablet daily,   5 mg.  Advised to follow-up with him or another endocrinologist in 4-6 weeks, follow-up with primary care doctor sooner to trend thyroid function.  Also advised the patient may stop propranolol.  Patient currently in no acute distress, normal vital signs, agrees with the plan.  Vitals:   11/02/17 1015 11/02/17 1030 11/02/17 1045 11/02/17 1100  BP:  129/89  (!) 114/92  Pulse: 67 72 69 68  Resp: 11 14 13 15   Temp:      TempSrc:      SpO2: 100% 100% 99% 100%     Final Clinical Impressions(s) / ED Diagnoses   Final diagnoses:  Hypothyroidism due to medication    ED Discharge Orders    None       Jaynie Crumble, PA-C 11/02/17 1237    Benjiman Core, MD 11/02/17 1554

## 2017-11-02 NOTE — Discharge Instructions (Addendum)
Start taking 1/2 tab of your methimazole daily. Stop propranalol.  Follow-up with family doctor closely.  Follow-up with Dr. Allena KatzPatel if able.  Return if any worsening symptoms.

## 2017-11-02 NOTE — ED Notes (Signed)
Lab called to ask about thyroid lab still in process. Per lab, it was high so there is a dilution running on it now.

## 2017-11-02 NOTE — ED Triage Notes (Signed)
Pt c/o intermittent left upper chest pains that radiates to her back for over week. Reports today at work noticed that her nose was bleeding-bleeding controlled at this time.  Pt reports for week and half she's noticed bilat eye swelling that will be intermittent.

## 2017-11-03 LAB — T3: T3, Total: <20 ng/dL — ABNORMAL LOW (ref 71–180)

## 2017-11-21 MED FILL — methIMAzole 10 MG TABS: 10 | 30 days supply | Qty: 45 | Fill #4

## 2018-01-17 MED FILL — methIMAzole 10 MG TABS: 10 | 10 days supply | Qty: 15 | Fill #5

## 2018-02-07 MED FILL — methIMAzole 10 MG TABS: 10 | 30 days supply | Qty: 15 | Fill #0

## 2018-03-15 MED FILL — methIMAzole 10 MG TABS: 10 | 30 days supply | Qty: 15 | Fill #1

## 2018-04-09 ENCOUNTER — Inpatient Hospital Stay (HOSPITAL_COMMUNITY)
Admission: AD | Admit: 2018-04-09 | Discharge: 2018-04-09 | Discharge status: Against Medical Advice | Payer: Self-pay | Source: Ambulatory Visit | Attending: Obstetrics and Gynecology | Admitting: Obstetrics and Gynecology

## 2018-04-09 ENCOUNTER — Other Ambulatory Visit: Payer: Self-pay

## 2018-04-09 DIAGNOSIS — Z5321 Procedure and treatment not carried out due to patient leaving prior to being seen by health care provider: Secondary | ICD-10-CM | POA: Insufficient documentation

## 2018-04-09 NOTE — MAU Note (Signed)
Has a bump, in her lovely parts, but is closer to her butt.  Is hard, getting bigger and is moving.  No pain.

## 2018-04-09 NOTE — MAU Note (Signed)
Pt told adm clerk, that she decided not to wait.  She would call her dr and make an appointment

## 2018-04-19 MED FILL — methIMAzole 10 MG TABS: 10 | 30 days supply | Qty: 15 | Fill #2

## 2018-05-18 MED FILL — methIMAzole 10 MG TABS: 10 | 30 days supply | Qty: 15 | Fill #0

## 2018-06-18 MED FILL — methIMAzole 10 MG TABS: 10 | 30 days supply | Qty: 15 | Fill #1

## 2018-06-19 ENCOUNTER — Encounter: Payer: Self-pay | Admitting: Emergency Medicine

## 2018-06-19 ENCOUNTER — Other Ambulatory Visit: Payer: Self-pay

## 2018-06-19 ENCOUNTER — Ambulatory Visit (INDEPENDENT_AMBULATORY_CARE_PROVIDER_SITE_OTHER): Payer: 59 | Admitting: Emergency Medicine

## 2018-06-19 ENCOUNTER — Emergency Department (HOSPITAL_COMMUNITY)
Admission: EM | Admit: 2018-06-19 | Discharge: 2018-06-19 | Disposition: A | Discharge status: Home / Self Care | Payer: 59 | Attending: Emergency Medicine | Admitting: Emergency Medicine

## 2018-06-19 ENCOUNTER — Encounter (HOSPITAL_COMMUNITY): Payer: Self-pay | Admitting: Emergency Medicine

## 2018-06-19 VITALS — BP 121/77 | HR 88 | Temp 98.9°F | Resp 16 | Ht 63.75 in | Wt 105.4 lb

## 2018-06-19 DIAGNOSIS — Z5321 Procedure and treatment not carried out due to patient leaving prior to being seen by health care provider: Secondary | ICD-10-CM | POA: Diagnosis not present

## 2018-06-19 DIAGNOSIS — E0500 Thyrotoxicosis with diffuse goiter without thyrotoxic crisis or storm: Secondary | ICD-10-CM

## 2018-06-19 DIAGNOSIS — E060 Acute thyroiditis: Secondary | ICD-10-CM

## 2018-06-19 DIAGNOSIS — E0501 Thyrotoxicosis with diffuse goiter with thyrotoxic crisis or storm: Secondary | ICD-10-CM | POA: Insufficient documentation

## 2018-06-19 DIAGNOSIS — J029 Acute pharyngitis, unspecified: Secondary | ICD-10-CM | POA: Diagnosis not present

## 2018-06-19 MED ORDER — AMOXICILLIN 500 MG PO CAPS: 500.0000 mg | ORAL_CAPSULE | Freq: Three times a day (TID) | ORAL | 0 refills | Status: DC

## 2018-06-19 NOTE — ED Triage Notes (Signed)
Pt reports that she was sent by PCP for Thyroid storm. Reports that she had cold/flu symptoms for couple days.

## 2018-06-19 NOTE — Progress Notes (Signed)
Sarah Fritz 36 y.o.   Chief Complaint  Patient presents with  . Establish Care  . Cough    nonproductive x 2 days  . Sore Throat    x 2 days    HISTORY OF PRESENT ILLNESS: This is a 37 y.o. female with history of Graves' disease complaining of 2-day history of sore throat, coughing with phlegm, fever, near-syncope, and generalized achiness.  Takes half a tablet of Tapazole daily.  No longer taking beta-blocker.  HPI   Prior to Admission medications   Medication Sig Start Date End Date Taking? Authorizing Provider  aspirin-sod bicarb-citric acid (ALKA-SELTZER) 325 MG TBEF tablet Take 325 mg by mouth every 6 (six) hours as needed (for acid reflex).   Yes [provider]  methimazole (TAPAZOLE) 10 MG tablet Take 1.5 tablets (15 mg total) daily by mouth. 06/28/17  Yes Sarah Frisk, MD  diphenhydrAMINE (BENADRYL) 25 MG tablet Take 25 mg by mouth every 6 (six) hours as needed for allergies.    [provider]  propranolol (INDERAL) 20 MG tablet Take 1 tablet (20 mg total) 3 (three) times daily by mouth. 06/20/17 11/02/17  Joy, Ines Bloomer C, PA-Fritz    Allergies  Allergen Reactions  . Percocet [Oxycodone-Acetaminophen] Nausea And Vomiting and Other (See Comments)    Sweating, pass out  . Vicodin [Hydrocodone-Acetaminophen] Nausea And Vomiting and Other (See Comments)    Sweating, pass out    Patient Active Problem List   Diagnosis Date Noted  . Pap smear for cervical cancer screening 11/23/2016  . Graves disease 07/27/2016  . Hyperthyroidism 07/14/2016  . Chronic pain syndrome 07/14/2016  . Smoking 07/14/2016  . Chlamydia 04/18/2013  . Recurrent UTI 04/18/2013    Past Medical History:  Diagnosis Date  . Abscess   . Anemia   . Chlamydia   . Depression   . Gonorrhea   . Graves disease   . Hypertension     Past Surgical History:  Procedure Laterality Date  . WISDOM TOOTH EXTRACTION      Social History   Socioeconomic History  . Marital  status: Single    Spouse name: Not on file  . Number of children: Not on file  . Years of education: Not on file  . Highest education level: Not on file  Occupational History  . Not on file  Social Needs  . Financial resource strain: Not on file  . Food insecurity:    Worry: Not on file    Inability: Not on file  . Transportation needs:    Medical: Not on file    Non-medical: Not on file  Tobacco Use  . Smoking status: Current Every Day Smoker    Packs/day: 0.50    Types: Cigarettes  . Smokeless tobacco: Current User  Substance and Sexual Activity  . Alcohol use: No    Comment: occasional  . Drug use: Yes    Types: Marijuana    Comment: Last marijuana 09/01/15  . Sexual activity: Yes    Birth control/protection: None  Lifestyle  . Physical activity:    Days per week: Not on file    Minutes per session: Not on file  . Stress: Not on file  Relationships  . Social connections:    Talks on phone: Not on file    Gets together: Not on file    Attends religious service: Not on file    Active member of club or organization: Not on file    Attends meetings of  clubs or organizations: Not on file    Relationship status: Not on file  . Intimate partner violence:    Fear of current or ex partner: Not on file    Emotionally abused: Not on file    Physically abused: Not on file    Forced sexual activity: Not on file  Other Topics Concern  . Not on file  Social History Narrative  . Not on file    Family History  Problem Relation Age of Onset  . Heart disease Mother   . Kidney disease Mother   . COPD Mother   . Cancer Mother   . Cancer Father   . COPD Father      ROS   Physical Exam  Constitutional: She is oriented to person, place, and time. She appears well-developed.  Thin  HENT:  Head: Normocephalic and atraumatic.  Nose: Nose normal.  Mouth/Throat: Oropharynx is clear and moist. No oropharyngeal exudate.  Eyes: Pupils are equal, round, and reactive to  light. Conjunctivae and EOM are normal.  Proptosis with lid lag  Neck: Thyromegaly (Very tender enlarged thyroid) present.  Cardiovascular: Regular rhythm and normal heart sounds.  Repeat heart rate: 100 and regular  Pulmonary/Chest: Effort normal and breath sounds normal.  Abdominal: Soft. There is no tenderness.  Musculoskeletal: Normal range of motion.  Lymphadenopathy:    She has no cervical adenopathy.  Neurological: She is alert and oriented to person, place, and time. No sensory deficit. She exhibits normal muscle tone.  Skin: Skin is warm and dry. Capillary refill takes less than 2 seconds.  Psychiatric: She has a normal mood and affect. Her behavior is normal.  Vitals reviewed.    ASSESSMENT & PLAN: Sarah Fritz was seen today for establish care, cough and sore throat.  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   Sarah Barth, MD [facility]

## 2018-06-20 MED FILL — AMOXICILLIN 500 MG CAPSULE: 500 | 7 days supply | Qty: 21 | Fill #0

## 2018-06-29 NOTE — ED Provider Notes (Signed)
[facility] Provider Note   CSN: [id number] Arrival date & time: 06/19/18  1553     History   Chief Complaint Chief Complaint  Patient presents with  . sent by PCP  . Cough  . Nasal Congestion    HPI Sarah Fritz is a 37 y.o. female.  HPI   37yF sent for evaluation of possible thyroid storm. She went to PCP for evaluation of URI symptoms for the past 2-3 days. Cough. Sore throat. Congestion. Cough occasionally productive. Subjective fever. She has a past hx of Grave's disease on methimazole and propanolol. She reports compliance. She denies HA, confusion, rapid heart rate, etc.   Past Medical History:  Diagnosis Date  . Abscess   . Anemia   . Chlamydia   . Depression   . Gonorrhea   . Graves disease   . Hypertension     Patient Active Problem List   Diagnosis Date Noted  . Acute thyroiditis 06/19/2018  . 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 Pap smear for cervical cancer screening 11/23/2016  . Graves disease 07/27/2016  . Hyperthyroidism 07/14/2016  . Chronic pain syndrome 07/14/2016  . Smoking 07/14/2016  . Chlamydia 04/18/2013  . Recurrent UTI 04/18/2013    Past Surgical History:  Procedure Laterality Date  . WISDOM TOOTH EXTRACTION       OB History    Gravida  1   Para  1   Term  1   Preterm      AB      Living  1     SAB      TAB      Ectopic      Multiple      Live Births               Home Medications    Prior to Admission medications   Medication Sig Start Date End Date Taking? Authorizing Provider  Aspirin-Salicylamide-Caffeine (ARTHRITIS STRENGTH BC POWDER PO) Take 1 packet by mouth 2 (two) times daily as needed (pain due to thyroid).   Yes [provider]  aspirin-sod bicarb-citric acid (ALKA-SELTZER) 325 MG TBEF tablet Take 325 mg by mouth every 6 (six) hours as needed (for acid reflex).   Yes [provider]  methimazole  (TAPAZOLE) 10 MG tablet Take 1.5 tablets (15 mg total) daily by mouth. Patient taking differently: Take 5 mg by mouth daily.  06/28/17  Yes Storm Frisk, MD  amoxicillin (AMOXIL) 500 MG capsule Take 1 capsule (500 mg total) by mouth 3 (three) times daily. 06/19/18   Raeford Razor, MD  propranolol (INDERAL) 20 MG tablet Take 1 tablet (20 mg total) 3 (three) times daily by mouth. Patient not taking: Reported on 06/19/2018 06/20/17 11/02/17  Anselm Pancoast, PA-C    Family History Family History  Problem Relation Age of Onset  . Heart disease Mother   . Kidney disease Mother   . COPD Mother   . Cancer Mother   . Cancer Father   . COPD Father     Social History Social History   Tobacco Use  . Smoking status: Current Every Day Smoker    Packs/day: 0.50    Types: Cigarettes  . Smokeless tobacco: Current User  Substance Use Topics  . Alcohol use: No    Comment: occasional  . Drug use: Yes    Types: Marijuana    Comment: Last marijuana 09/01/15     Allergies   Percocet [  oxycodone-acetaminophen] and Vicodin [hydrocodone-acetaminophen]   Review of Systems Review of Systems  All systems reviewed and negative, other than as noted in HPI.  Physical Exam Updated Vital Signs BP 136/88 (BP Location: Right Arm)   Pulse 87   Temp 98.8 F (37.1 C) (Oral)   Resp 14   LMP 06/19/2018   SpO2 97%   Physical Exam  Constitutional: She appears well-developed and well-nourished. No distress.  HENT:  Head: Normocephalic and atraumatic.  Pharyngitis w/o exudate. Goiter.   Eyes: Pupils are equal, round, and reactive to light. Conjunctivae are normal. Right eye exhibits no discharge. Left eye exhibits no discharge.  Neck: Neck supple.  Cardiovascular: Normal rate, regular rhythm and normal heart sounds. Exam reveals no gallop and no friction rub.  No murmur heard. Pulmonary/Chest: Effort normal and breath sounds normal. No respiratory distress.  Abdominal: Soft. She exhibits no  distension. There is no tenderness.  Musculoskeletal: She exhibits no edema or tenderness.  Neurological: She is alert.  Skin: Skin is warm and dry.  Psychiatric: She has a normal mood and affect. Her behavior is normal. Thought content normal.  Nursing note and vitals reviewed.    ED Treatments / Results  Labs (all labs ordered are listed, but only abnormal results are displayed) Labs Reviewed - No data to display  EKG None  Radiology No results found.  Procedures Procedures (including critical care time)  Medications Ordered in ED Medications - No data to display   Initial Impression / Assessment and Plan / ED Course  I have reviewed the triage vital signs and the nursing notes.  Pertinent labs & imaging results that were available during my care of the patient were reviewed by me and considered in my medical decision making (see chart for details).     37yF with pharyngitis. No evidence of serious deep space infection. Abx.   Clinically I doubt thyroid storm. Subjective fevers but in the setting of URI/pharyngitis. Afebrile in ED. Normal mentation. Not tachycardic. Not hypertensive. No GI symptoms, etc.  I discussed signs/symptoms to be aware of. Continue tapazole/beta blocker otherwise.   Final Clinical Impressions(s) / ED Diagnoses   Final diagnoses:  Acute pharyngitis, unspecified etiology    ED Discharge Orders         Ordered    amoxicillin (AMOXIL) 500 MG capsule  3 times daily     06/19/18 2018           Raeford RazorKohut, Dafne Nield, MD 06/29/18 [phone]

## 2018-07-18 ENCOUNTER — Encounter: Payer: 59 | Admitting: Emergency Medicine

## 2018-07-23 MED FILL — methIMAzole 10 MG TABS: 10 | 30 days supply | Qty: 15 | Fill #2

## 2018-08-22 MED FILL — PROPYLTHIOURACIL 50 MG TABS: 50 | 30 days supply | Qty: 30 | Fill #0

## 2018-10-31 ENCOUNTER — Encounter: Payer: 59 | Admitting: Emergency Medicine

## 2018-12-25 MED FILL — PROPYLTHIOURACIL 50 MG TABS: 50 | 15 days supply | Qty: 150 | Fill #0

## 2018-12-27 ENCOUNTER — Other Ambulatory Visit: Payer: Self-pay

## 2018-12-27 ENCOUNTER — Encounter: Payer: Self-pay | Admitting: Family Medicine

## 2018-12-27 ENCOUNTER — Ambulatory Visit (INDEPENDENT_AMBULATORY_CARE_PROVIDER_SITE_OTHER): Payer: 59 | Admitting: Family Medicine

## 2018-12-27 VITALS — BP 136/90 | HR 115 | Temp 98.7°F | Ht 63.75 in | Wt 98.8 lb

## 2018-12-27 DIAGNOSIS — N750 Cyst of Bartholin's gland: Secondary | ICD-10-CM | POA: Diagnosis not present

## 2018-12-27 NOTE — Patient Instructions (Signed)
° ° ° °  If you have lab work done today you will be contacted with your lab results within the next 2 weeks.  If you have not heard from us then please contact us. The fastest way to get your results is to register for My Chart. ° ° °IF you received an x-ray today, you will receive an invoice from [facility]. Please contact [facility] at [phone] with questions or concerns regarding your invoice.  ° °IF you received labwork today, you will receive an invoice from [facility]. Please contact [facility] at [phone] with questions or concerns regarding your invoice.  ° °Our billing staff will not be able to assist you with questions regarding bills from these companies. ° °You will be contacted with the lab results as soon as they are available. The fastest way to get your results is to activate your My Chart account. Instructions are located on the last page of this paperwork. If you have not heard from us regarding the results in 2 weeks, please contact this office. °  ° ° ° °

## 2018-12-27 NOTE — Progress Notes (Signed)
5/14/20209:58 AM  Sarah Fritz 1981-01-29, 38 y.o., female [id number]  Chief Complaint  Patient presents with  . Mass    bump on the vagina, no pain, no discharge, harden, size changes by the day    HPI:   Patient is a 38 y.o. female with past medical history significant for graves who presents today for bump on the vagina  Noticed right sided vagina bump over a month ago Reports size changes Denies any pain or drainage Has not tried any thing for this  Fall Risk  12/27/2018 06/19/2018 06/30/2016  Falls in the past year? 0 0 No  Number falls in past yr: 0 - -  Injury with Fall? 0 - -     Depression screen [facility] 2/9 12/27/2018 06/19/2018 06/28/2017  Decreased Interest 0 0 0  Down, Depressed, Hopeless 0 0 0  PHQ - 2 Score 0 0 0  Altered sleeping - - 0  Tired, decreased energy - - 0  Change in appetite - - 0  Feeling bad or failure about yourself  - - 0  Trouble concentrating - - 0  Moving slowly or fidgety/restless - - 0  Suicidal thoughts - - 0  PHQ-9 Score - - 0    Allergies  Allergen Reactions  . Percocet [Oxycodone-Acetaminophen] Nausea And Vomiting and Other (See Comments)    Sweating, pass out  . Vicodin [Hydrocodone-Acetaminophen] Nausea And Vomiting and Other (See Comments)    Sweating, pass out    Prior to Admission medications   Medication Sig Start Date End Date Taking? Authorizing Provider  propylthiouracil (PTU) 50 MG tablet Take 3 tablet in morning and 2 tablet in evening. 12/26/18  Yes [provider]    Past Medical History:  Diagnosis Date  . Abscess   . Anemia   . Chlamydia   . Depression   . Gonorrhea   . Graves disease   . Hypertension     Past Surgical History:  Procedure Laterality Date  . WISDOM TOOTH EXTRACTION      Social History   Tobacco Use  . Smoking status: Current Every Day Smoker    Packs/day: 0.50    Types: Cigarettes  . Smokeless tobacco: Current User  Substance Use Topics  . Alcohol use: No   Comment: occasional    Family History  Problem Relation Age of Onset  . Heart disease Mother   . Kidney disease Mother   . COPD Mother   . Cancer Mother   . Cancer Father   . COPD Father     ROS Per hpi Denies any fever, chills, malaise Denies any pelvic pain, dysuria, hematuria  OBJECTIVE:  Today's Vitals   12/27/18 0951  BP: 136/90  Pulse: (!) 115  Temp: 98.7 F (37.1 C)  TempSrc: Oral  SpO2: 100%  Weight: 98 lb 12.8 oz (44.8 kg)  Height: 5' 3.75" (1.619 m)   Body mass index is 17.09 kg/m.   Physical Exam Vitals signs and nursing note reviewed. Exam conducted with a chaperone present.  Constitutional:      General: She is not in acute distress. Genitourinary:   Neurological:     Mental Status: She is alert.     ASSESSMENT and PLAN  1. Bartholin's cyst - Ambulatory referral to Obstetrics / Gynecology  Other orders  Return if symptoms worsen or fail to improve.    Myles Lipps, MD Primary Care at [facility] [address] [place], [place] [address] Ph.  [phone] Fax [phone]

## 2019-01-08 ENCOUNTER — Encounter: Payer: Self-pay | Admitting: Family Medicine

## 2019-01-14 ENCOUNTER — Ambulatory Visit (INDEPENDENT_AMBULATORY_CARE_PROVIDER_SITE_OTHER): Payer: 59 | Admitting: Obstetrics & Gynecology

## 2019-01-14 ENCOUNTER — Encounter: Payer: Self-pay | Admitting: *Deleted

## 2019-01-14 ENCOUNTER — Other Ambulatory Visit: Payer: Self-pay

## 2019-01-14 ENCOUNTER — Encounter: Payer: Self-pay | Admitting: Obstetrics & Gynecology

## 2019-01-14 VITALS — BP 152/91 | HR 102 | Ht 64.0 in | Wt 99.0 lb

## 2019-01-14 DIAGNOSIS — N750 Cyst of Bartholin's gland: Secondary | ICD-10-CM | POA: Diagnosis not present

## 2019-01-14 NOTE — Progress Notes (Signed)
Sent over from PCP for bartholins cyst

## 2019-01-14 NOTE — Progress Notes (Signed)
   Subjective:    Patient ID: Sarah Fritz, female    DOB: 02/27/1981, 38 y.o.   MRN: [id number]  HPI  38 yo P1 here to have a Bartholin's cyst drained. It has been there for about 2 months, not terribly painful.  Review of Systems     Objective:   Physical Exam Breathing, conversing, and ambulating normally Well nourished, well hydrated Black female, no apparent distress 4 cm right Bartholin's cyst noted After a consent was performed, I prepped the area with betadine and injected about 3 cc of 1% lidocaine. I made a small incision and a large amount of dark viscous fluid came out. I used hemostats to make sure there were no loculations. The cyst was completely deflated and I placed a Word catheter. She tolerated the procedure well.    Assessment & Plan:  Bartholin's cyst Come back 2 weeks for removal of catheter

## 2019-01-15 ENCOUNTER — Telehealth: Payer: Self-pay | Admitting: Obstetrics & Gynecology

## 2019-01-15 NOTE — Telephone Encounter (Signed)
Patient that goes to [facility] was told by Dr Marice Potter that she could come here because we have a office in [place].

## 2019-02-01 ENCOUNTER — Telehealth: Payer: Self-pay | Admitting: Obstetrics & Gynecology

## 2019-02-01 MED FILL — PROPYLTHIOURACIL 50 MG TABS: 50 | 15 days supply | Qty: 150 | Fill #2

## 2019-02-01 NOTE — Telephone Encounter (Signed)
Patient was called and instructed about her visit for 02/04/2019. °

## 2019-02-04 ENCOUNTER — Encounter: Payer: Self-pay | Admitting: Obstetrics & Gynecology

## 2019-02-04 ENCOUNTER — Other Ambulatory Visit: Payer: Self-pay

## 2019-02-04 ENCOUNTER — Ambulatory Visit (INDEPENDENT_AMBULATORY_CARE_PROVIDER_SITE_OTHER): Payer: 59 | Admitting: Obstetrics & Gynecology

## 2019-02-04 VITALS — BP 135/92 | HR 104 | Temp 98.7°F | Ht 64.0 in | Wt 99.6 lb

## 2019-02-04 DIAGNOSIS — N750 Cyst of Bartholin's gland: Secondary | ICD-10-CM | POA: Diagnosis not present

## 2019-02-04 NOTE — Progress Notes (Signed)
   Subjective:    Patient ID: Sarah Fritz, female    DOB: January 18, 1981, 38 y.o.   MRN: [id number]  HPI 38 yo P1 is here today to have a Word catheter removed after a right Bartholin's cyst was removed about 2+ weeks ago. She has no complaints today.   Review of Systems Her pap was normal and is upd    Objective:   Physical Exam Breathing, conversing, and ambulating normally Well nourished, well hydrated Black female, no apparent distress Word catheter removed Site of previous Bartholin's cyst normal    Assessment & Plan:  S/p cyst drainage She will schedule an annual at her convenience

## 2019-02-27 ENCOUNTER — Encounter: Payer: Self-pay | Admitting: Emergency Medicine

## 2019-02-27 ENCOUNTER — Other Ambulatory Visit (HOSPITAL_COMMUNITY)
Admission: RE | Admit: 2019-02-27 | Discharge: 2019-02-27 | Disposition: A | Discharge status: Home / Self Care | Payer: 59 | Source: Ambulatory Visit | Attending: Obstetrics & Gynecology | Admitting: Obstetrics & Gynecology

## 2019-02-27 ENCOUNTER — Other Ambulatory Visit: Payer: Self-pay

## 2019-02-27 ENCOUNTER — Ambulatory Visit (INDEPENDENT_AMBULATORY_CARE_PROVIDER_SITE_OTHER): Payer: 59 | Admitting: Obstetrics & Gynecology

## 2019-02-27 VITALS — BP 164/88 | HR 123 | Temp 98.7°F | Wt 101.7 lb

## 2019-02-27 DIAGNOSIS — Z23 Encounter for immunization: Secondary | ICD-10-CM

## 2019-02-27 DIAGNOSIS — Z01419 Encounter for gynecological examination (general) (routine) without abnormal findings: Secondary | ICD-10-CM | POA: Diagnosis not present

## 2019-02-27 NOTE — Progress Notes (Signed)
Subjective:    Sarah Fritz is a 38 y.o. divorced 12P1(39 yo son)  who presents for an annual exam. The patient has no complaints today. The patient is sexually active. GYN screening history: last pap: was normal. The patient wears seatbelts: yes. The patient participates in regular exercise: no. Has the patient ever been transfused or tattooed?: yes. The patient reports that there is not domestic violence in her life.   Menstrual History: OB History    Gravida  1   Para  1   Term  1   Preterm      AB      Living  1     SAB      TAB      Ectopic      Multiple      Live Births              Menarche age: [age] Patient's last menstrual period was 02/04/2019 (exact date).    The following portions of the patient's history were reviewed and updated as appropriate: allergies, current medications, past family history, past medical history, past social history, past surgical history and problem list.  Review of Systems Pertinent items are noted in HPI.   FH- no breast/gyn/colon cancer Monogamous for 7 years, lives together Works at [employer], provider advocate She [facility] a [facility]in[facility] and is seeing him today.   Objective:    BP (!) 164/88   Pulse (!) 123   Temp 98.7 F (37.1 C)   Wt 101 lb 11.2 oz (46.1 kg)   LMP 02/04/2019 (Exact Date)   BMI 17.46 kg/m   General Appearance:    Alert, cooperative, no distress, appears stated age  Head:    Normocephalic, without obvious abnormality, atraumatic  Eyes:    PERRL, conjunctiva/corneas clear, EOM's intact, fundi    benign, both eyes  Ears:    Normal TM's and external ear canals, both ears  Nose:   Nares normal, septum midline, mucosa normal, no drainage    or sinus tenderness  Throat:   Lips, mucosa, and tongue normal; teeth and gums normal  Neck:   Supple, symmetrical, trachea midline, no adenopathy;    thyroid:  no enlargement/tenderness/nodules; no carotid   bruit or JVD  Back:      Symmetric, no curvature, ROM normal, no CVA tenderness  Lungs:     Clear to auscultation bilaterally, respirations unlabored  Chest Wall:    No tenderness or deformity   Heart:    Regular rate and rhythm, S1 and S2 normal, no murmur, rub   or gallop  Breast Exam:    No tenderness, masses, or nipple abnormality  Abdomen:     Soft, non-tender, bowel sounds active all four quadrants,    no masses, no organomegaly  Genitalia:    Normal female without lesion, discharge or tenderness, normal size and shape, anteverted, mobile, non-tender, normal adnexal exam      Extremities:   Extremities normal, atraumatic, no cyanosis or edema  Pulses:   2+ and symmetric all extremities  Skin:   Skin color, texture, turgor normal, no rashes or lesions  Lymph nodes:   Cervical, supraclavicular, and axillary nodes normal  Neurologic:   CNII-XII intact, normal strength, sensation and reflexes    throughout  .    Assessment:    Healthy female exam.   HTN   Plan:     Thin prep Pap smear. with cotesting Rec that she see her  fam med doc Start Gardasil today

## 2019-03-01 LAB — CYTOLOGY - PAP
Diagnosis: NEGATIVE
HPV: NOT DETECTED

## 2019-03-18 ENCOUNTER — Encounter: Payer: Self-pay | Admitting: Emergency Medicine

## 2019-03-18 ENCOUNTER — Other Ambulatory Visit: Payer: Self-pay

## 2019-03-18 ENCOUNTER — Ambulatory Visit (INDEPENDENT_AMBULATORY_CARE_PROVIDER_SITE_OTHER): Payer: 59 | Admitting: Emergency Medicine

## 2019-03-18 VITALS — BP 125/76 | HR 91 | Temp 98.9°F | Resp 16 | Ht 64.0 in | Wt 101.6 lb

## 2019-03-18 DIAGNOSIS — E0500 Thyrotoxicosis with diffuse goiter without thyrotoxic crisis or storm: Secondary | ICD-10-CM | POA: Diagnosis not present

## 2019-03-18 NOTE — Patient Instructions (Addendum)
If you have lab work done today you will be contacted with your lab results within the next 2 weeks.  If you have not heard from [place] then please contact us. The fastest way to get your results is to register for My Chart.   IF you received an x-ray today, you will receive an invoice from [facility]. Please contact [facility] at [phone] with questions or concerns regarding your invoice.   IF you received labwork today, you will receive an invoice from [facility]. Please contact [facility] at [phone] with questions or concerns regarding your invoice.   Our billing staff will not be able to assist you with questions regarding bills from these companies.  You will be contacted with the lab results as soon as they are available. The fastest way to get your results is to activate your My Chart account. Instructions are located on the last page of this paperwork. If you have not heard from [place] regarding the results in 2 weeks, please contact this office.     Hyperthyroidism  Hyperthyroidism is when the thyroid gland is too active (overactive). The thyroid gland is a small gland located in the lower front part of the neck, just in front of the windpipe (trachea). This gland makes hormones that help control how the body uses food for energy (metabolism) as well as how the heart and brain function. These hormones also play a role in keeping your bones strong. When the thyroid is overactive, it produces too much of a hormone called thyroxine. What are the causes? This condition may be caused by:  Graves' disease. This is a disorder in which the body's disease-fighting system (immune system) attacks the thyroid gland. This is the most common cause.  Inflammation of the thyroid gland.  A tumor in the thyroid gland.  Use of certain medicines, including: ? Prescription thyroid hormone replacement. ? Herbal supplements that mimic thyroid hormones. ? Amiodarone  therapy.  Solid or fluid-filled lumps within your thyroid gland (thyroid nodules).  Taking in a large amount of iodine from foods or medicines. What increases the risk? You are more likely to develop this condition if:  You are female.  You have a family history of thyroid conditions.  You smoke tobacco.  You use a medicine called lithium.  You take medicines that affect the immune system (immunosuppressants). What are the signs or symptoms? Symptoms of this condition include:  Nervousness.  Inability to tolerate heat.  Unexplained weight loss.  Diarrhea.  Change in the texture of hair or skin.  Heart skipping beats or making extra beats.  Rapid heart rate.  Loss of menstruation.  Shaky hands.  Fatigue.  Restlessness.  Sleep problems.  Enlarged thyroid gland or a lump in the thyroid (nodule). You may also have symptoms of Graves' disease, which may include:  Protruding eyes.  Dry eyes.  Red or swollen eyes.  Problems with vision. How is this diagnosed? This condition may be diagnosed based on:  Your symptoms and medical history.  A physical exam.  Blood tests.  Thyroid ultrasound. This test involves using sound waves to produce images of the thyroid gland.  A thyroid scan. A radioactive substance is injected into a vein, and images show how much iodine is present in the thyroid.  Radioactive iodine uptake test (RAIU). A small amount of radioactive iodine is given by mouth to see how much iodine the thyroid absorbs after a certain amount of time. How is this treated? Treatment  depends on the cause and severity of the condition. Treatment may include:  Medicines to reduce the amount of thyroid hormone your body makes.  Radioactive iodine treatment (radioiodine therapy). This involves swallowing a small dose of radioactive iodine, in capsule or liquid form, to kill thyroid cells.  Surgery to remove part or all of your thyroid gland. You may need  to take thyroid hormone replacement medicine for the rest of your life after thyroid surgery.  Medicines to help manage your symptoms. Follow these instructions at home:   Take over-the-counter and prescription medicines only as told by your health care provider.  Do not use any products that contain nicotine or tobacco, such as cigarettes and e-cigarettes. If you need help quitting, ask your health care provider.  Follow any instructions from your health care provider about diet. You may be instructed to limit foods that contain iodine.  Keep all follow-up visits as told by your health care provider. This is important. ? You will need to have blood tests regularly so that your health care provider can monitor your condition. Contact a health care provider if:  Your symptoms do not get better with treatment.  You have a fever.  You are taking thyroid hormone replacement medicine and you: ? Have symptoms of depression. ? Feel like you are tired all the time. ? Gain weight. Get help right away if:  You have chest pain.  You have decreased alertness or a change in your awareness.  You have abdominal pain.  You feel dizzy.  You have a rapid heartbeat.  You have an irregular heartbeat.  You have difficulty breathing. Summary  The thyroid gland is a small gland located in the lower front part of the neck, just in front of the windpipe (trachea).  Hyperthyroidism is when the thyroid gland is too active (overactive) and produces too much of a hormone called thyroxine.  The most common cause is Graves' disease, a disorder in which your immune system attacks the thyroid gland.  Hyperthyroidism can cause various symptoms, such as unexplained weight loss, nervousness, inability to tolerate heat, or changes in your heartbeat.  Treatment may include medicine to reduce the amount of thyroid hormone your body makes, radioiodine therapy, surgery, or medicines to manage symptoms. This  information is not intended to replace advice given to you by your health care provider. Make sure you discuss any questions you have with your health care provider. Document Released: 08/01/2005 Document Revised: 07/14/2017 Document Reviewed: 07/12/2017 Elsevier Patient Education  2020 [employer].

## 2019-03-18 NOTE — Progress Notes (Signed)
Sarah Fritz 38 y.o.   Chief Complaint  Patient presents with  . Hypertension    follow up - acute thyroiditis   BP Readings from Last 3 Encounters:  03/18/19 125/76  02/27/19 (!) 164/88  02/04/19 (!) 135/92     HISTORY OF PRESENT ILLNESS: This is a 38 y.o. female with history of graves disease and hyperthyroidism presently taking PTU and seen endocrinologist here for follow-up.  Has no complaints or medical concerns today.  Blood work last month with increase free T3, otherwise unremarkable and within normal limits.  Doing well.  HPI   Prior to Admission medications   Medication Sig Start Date End Date Taking? Authorizing Provider  propylthiouracil (PTU) 50 MG tablet Take 3 tablet in morning and 2 tablet in evening. 12/26/18  Yes [provider]    Allergies  Allergen Reactions  . Percocet [Oxycodone-Acetaminophen] Nausea And Vomiting and Other (See Comments)    Sweating, pass out  . Vicodin [Hydrocodone-Acetaminophen] Nausea And Vomiting and Other (See Comments)    Sweating, pass out    Patient Active Problem List   Diagnosis Date Noted  . Graves disease 07/27/2016  . Hyperthyroidism 07/14/2016  . Chronic pain syndrome 07/14/2016  . Smoking 07/14/2016  . Recurrent UTI 04/18/2013    Past Medical History:  Diagnosis Date  . Abscess   . Anemia   . Chlamydia   . Depression   . Gonorrhea   . Graves disease   . Hypertension     Past Surgical History:  Procedure Laterality Date  . WISDOM TOOTH EXTRACTION      Social History   Socioeconomic History  . Marital status: Single    Spouse name: Not on file  . Number of children: Not on file  . Years of education: Not on file  . Highest education level: Not on file  Occupational History  . Not on file  Social Needs  . Financial resource strain: Not on file  . Food insecurity    Worry: Not on file    Inability: Not on file  . Transportation needs    Medical: Not on file    Non-medical:  Not on file  Tobacco Use  . Smoking status: Current Every Day Smoker    Packs/day: 0.50    Types: Cigarettes  . Smokeless tobacco: Current User  Substance and Sexual Activity  . Alcohol use: No    Comment: occasional  . Drug use: Yes    Types: Marijuana    Comment: Last marijuana 09/01/15  . Sexual activity: Yes    Birth control/protection: None  Lifestyle  . Physical activity    Days per week: Not on file    Minutes per session: Not on file  . Stress: Not on file  Relationships  . Social Musicianconnections    Talks on phone: Not on file    Gets together: Not on file    Attends religious service: Not on file    Active member of club or organization: Not on file    Attends meetings of clubs or organizations: Not on file    Relationship status: Not on file  . Intimate partner violence    Fear of current or ex partner: Not on file    Emotionally abused: Not on file    Physically abused: Not on file    Forced sexual activity: Not on file  Other Topics Concern  . Not on file  Social History Narrative  . Not on file  Family History  Problem Relation Age of Onset  . Heart disease Mother   . Kidney disease Mother   . COPD Mother   . Cancer Mother   . Cancer Father   . COPD Father      Review of Systems  Constitutional: Negative.  Negative for chills, fever and weight loss.  HENT: Negative.  Negative for sore throat.   Eyes: Negative.  Negative for blurred vision and double vision.  Respiratory: Negative.  Negative for cough and shortness of breath.   Cardiovascular: Negative.  Negative for chest pain and palpitations.  Gastrointestinal: Negative.  Negative for abdominal pain, diarrhea, nausea and vomiting.  Genitourinary: Negative.   Musculoskeletal: Negative.  Negative for myalgias and neck pain.  Skin: Negative.   Neurological: Negative.  Negative for dizziness and headaches.  Endo/Heme/Allergies: Negative.   All other systems reviewed and are negative.   Vitals:    03/18/19 0840  BP: 125/76  Pulse: 91  Resp: 16  Temp: 98.9 F (37.2 C)  SpO2: 100%    Physical Exam Vitals signs reviewed.  Constitutional:      Appearance: Normal appearance.  HENT:     Head: Normocephalic and atraumatic.     Nose: Nose normal.     Mouth/Throat:     Mouth: Mucous membranes are moist.     Pharynx: Oropharynx is clear.  Eyes:     Extraocular Movements: Extraocular movements intact.     Conjunctiva/sclera: Conjunctivae normal.     Pupils: Pupils are equal, round, and reactive to light.  Neck:     Musculoskeletal: Normal range of motion and neck supple.  Cardiovascular:     Rate and Rhythm: Normal rate and regular rhythm.     Pulses: Normal pulses.     Heart sounds: Normal heart sounds.  Pulmonary:     Effort: Pulmonary effort is normal.     Breath sounds: Normal breath sounds.  Abdominal:     General: There is no distension.     Palpations: Abdomen is soft.     Tenderness: There is no abdominal tenderness.  Musculoskeletal: Normal range of motion.  Skin:    General: Skin is warm and dry.     Capillary Refill: Capillary refill takes less than 2 seconds.  Neurological:     General: No focal deficit present.     Mental Status: She is alert and oriented to person, place, and time.  Psychiatric:        Mood and Affect: Mood normal.        Behavior: Behavior normal.      ASSESSMENT & PLAN: Sarah Fritz was seen today for hypertension.  Diagnoses and all orders for this visit:  Graves disease   Clinically stable.  No medical concerns identified during this visit. Continue present medications.  No changes.  Has appointment with endocrinologist next month.  Follow-up here in 6 months.   Patient Instructions       If you have lab work done today you will be contacted with your lab results within the next 2 weeks.  If you have not heard from us then please contact us. The fastest way to get your results is to register for My Chart.   IF you  received an x-ray today, you will receive an invoice from [facility]. Please contact [facility] at [phone] with questions or concerns regarding your invoice.   IF you received labwork today, you will receive an invoice from [facility]. Please contact [facility] at [phone] with  questions or concerns regarding your invoice.   Our billing staff will not be able to assist you with questions regarding bills from these companies.  You will be contacted with the lab results as soon as they are available. The fastest way to get your results is to activate your My Chart account. Instructions are located on the last page of this paperwork. If you have not heard from [place] regarding the results in 2 weeks, please contact this office.     Hyperthyroidism  Hyperthyroidism is when the thyroid gland is too active (overactive). The thyroid gland is a small gland located in the lower front part of the neck, just in front of the windpipe (trachea). This gland makes hormones that help control how the body uses food for energy (metabolism) as well as how the heart and brain function. These hormones also play a role in keeping your bones strong. When the thyroid is overactive, it produces too much of a hormone called thyroxine. What are the causes? This condition may be caused by:  Graves' disease. This is a disorder in which the body's disease-fighting system (immune system) attacks the thyroid gland. This is the most common cause.  Inflammation of the thyroid gland.  A tumor in the thyroid gland.  Use of certain medicines, including: ? Prescription thyroid hormone replacement. ? Herbal supplements that mimic thyroid hormones. ? Amiodarone therapy.  Solid or fluid-filled lumps within your thyroid gland (thyroid nodules).  Taking in a large amount of iodine from foods or medicines. What increases the risk? You are more likely to develop this condition if:  You are female.  You  have a family history of thyroid conditions.  You smoke tobacco.  You use a medicine called lithium.  You take medicines that affect the immune system (immunosuppressants). What are the signs or symptoms? Symptoms of this condition include:  Nervousness.  Inability to tolerate heat.  Unexplained weight loss.  Diarrhea.  Change in the texture of hair or skin.  Heart skipping beats or making extra beats.  Rapid heart rate.  Loss of menstruation.  Shaky hands.  Fatigue.  Restlessness.  Sleep problems.  Enlarged thyroid gland or a lump in the thyroid (nodule). You may also have symptoms of Graves' disease, which may include:  Protruding eyes.  Dry eyes.  Red or swollen eyes.  Problems with vision. How is this diagnosed? This condition may be diagnosed based on:  Your symptoms and medical history.  A physical exam.  Blood tests.  Thyroid ultrasound. This test involves using sound waves to produce images of the thyroid gland.  A thyroid scan. A radioactive substance is injected into a vein, and images show how much iodine is present in the thyroid.  Radioactive iodine uptake test (RAIU). A small amount of radioactive iodine is given by mouth to see how much iodine the thyroid absorbs after a certain amount of time. How is this treated? Treatment depends on the cause and severity of the condition. Treatment may include:  Medicines to reduce the amount of thyroid hormone your body makes.  Radioactive iodine treatment (radioiodine therapy). This involves swallowing a small dose of radioactive iodine, in capsule or liquid form, to kill thyroid cells.  Surgery to remove part or all of your thyroid gland. You may need to take thyroid hormone replacement medicine for the rest of your life after thyroid surgery.  Medicines to help manage your symptoms. Follow these instructions at home:   Take over-the-counter and prescription medicines only  as told by your  health care provider.  Do not use any products that contain nicotine or tobacco, such as cigarettes and e-cigarettes. If you need help quitting, ask your health care provider.  Follow any instructions from your health care provider about diet. You may be instructed to limit foods that contain iodine.  Keep all follow-up visits as told by your health care provider. This is important. ? You will need to have blood tests regularly so that your health care provider can monitor your condition. Contact a health care provider if:  Your symptoms do not get better with treatment.  You have a fever.  You are taking thyroid hormone replacement medicine and you: ? Have symptoms of depression. ? Feel like you are tired all the time. ? Gain weight. Get help right away if:  You have chest pain.  You have decreased alertness or a change in your awareness.  You have abdominal pain.  You feel dizzy.  You have a rapid heartbeat.  You have an irregular heartbeat.  You have difficulty breathing. Summary  The thyroid gland is a small gland located in the lower front part of the neck, just in front of the windpipe (trachea).  Hyperthyroidism is when the thyroid gland is too active (overactive) and produces too much of a hormone called thyroxine.  The most common cause is Graves' disease, a disorder in which your immune system attacks the thyroid gland.  Hyperthyroidism can cause various symptoms, such as unexplained weight loss, nervousness, inability to tolerate heat, or changes in your heartbeat.  Treatment may include medicine to reduce the amount of thyroid hormone your body makes, radioiodine therapy, surgery, or medicines to manage symptoms. This information is not intended to replace advice given to you by your health care provider. Make sure you discuss any questions you have with your health care provider. Document Released: 08/01/2005 Document Revised: 07/14/2017 Document Reviewed:  07/12/2017 Elsevier Patient Education  2020 Elsevier Inc.      Edwina BarthMiguel Shateria Paternostro, MD [facility]

## 2019-04-30 ENCOUNTER — Ambulatory Visit: Payer: 59

## 2019-08-30 ENCOUNTER — Encounter: Payer: Self-pay | Admitting: Obstetrics & Gynecology

## 2019-08-30 ENCOUNTER — Ambulatory Visit: Payer: 59

## 2019-09-03 ENCOUNTER — Encounter: Payer: Self-pay | Admitting: Emergency Medicine

## 2019-09-04 ENCOUNTER — Encounter: Payer: Self-pay | Admitting: Emergency Medicine

## 2019-09-05 ENCOUNTER — Other Ambulatory Visit: Payer: Self-pay

## 2019-09-05 ENCOUNTER — Encounter: Payer: Self-pay | Admitting: Emergency Medicine

## 2019-09-05 ENCOUNTER — Telehealth (INDEPENDENT_AMBULATORY_CARE_PROVIDER_SITE_OTHER): Payer: 59 | Admitting: Emergency Medicine

## 2019-09-05 VITALS — Ht 64.0 in | Wt 105.0 lb

## 2019-09-05 DIAGNOSIS — R05 Cough: Secondary | ICD-10-CM | POA: Diagnosis not present

## 2019-09-05 DIAGNOSIS — R062 Wheezing: Secondary | ICD-10-CM | POA: Diagnosis not present

## 2019-09-05 DIAGNOSIS — Z20822 Contact with and (suspected) exposure to covid-19: Secondary | ICD-10-CM

## 2019-09-05 DIAGNOSIS — J22 Unspecified acute lower respiratory infection: Secondary | ICD-10-CM | POA: Diagnosis not present

## 2019-09-05 DIAGNOSIS — R6889 Other general symptoms and signs: Secondary | ICD-10-CM

## 2019-09-05 DIAGNOSIS — R059 Cough, unspecified: Secondary | ICD-10-CM

## 2019-09-05 MED ORDER — PROMETHAZINE-CODEINE 6.25-10 MG/5ML PO SYRP: 5.0000 mL | ORAL_SOLUTION | Freq: Every evening | ORAL | 0 refills | Status: DC | PRN

## 2019-09-05 MED ORDER — AZITHROMYCIN 250 MG PO TABS: ORAL_TABLET | ORAL | 0 refills | Status: DC

## 2019-09-05 MED ORDER — PREDNISONE 20 MG PO TABS: 40.0000 mg | ORAL_TABLET | Freq: Every day | ORAL | 0 refills | Status: AC

## 2019-09-05 NOTE — Progress Notes (Signed)
Telemedicine Encounter- SOAP NOTE Established Patient  This telephone encounter was conducted with the patient's (or proxy's) verbal consent via audio telecommunications: yes/no: Yes Patient was instructed to have this encounter in a suitably private space; and to only have persons present to whom they give permission to participate. In addition, patient identity was confirmed by use of name plus two identifiers (DOB and address).  I discussed the limitations, risks, security and privacy concerns of performing an evaluation and management service by telephone and the availability of in person appointments. I also discussed with the patient that there may be a patient responsible charge related to this service. The patient expressed understanding and agreed to proceed.  I spent a total of TIME; 0 MIN TO 60 MIN: 15 minutes talking with the patient or their proxy.  Chief Complaint  Patient presents with  . Cough    non productive symptoms started 08/27/19 per patient   . Fever    102 degrees,body aches, chills, diarrhea and headache. per patient tested on 09/03/19 for COVID at [place] results today Mokuleia is a 39 y.o. female established patient. Telephone visit today complaining of flulike symptoms that started on 08/27/2019 with coughing, headache, diarrhea, wheezing, chest hurting, headache and general achiness.  Was tested once for Covid and was negative.  However she has all the typical Covid symptoms.  No one else sick at home.  Denies difficulty breathing.  No history of asthma.  No other significant symptoms.  HPI   Patient Active Problem List   Diagnosis Date Noted  . Graves disease 07/27/2016  . Hyperthyroidism 07/14/2016  . Chronic pain syndrome 07/14/2016  . Smoking 07/14/2016  . Recurrent UTI 04/18/2013    Past Medical History:  Diagnosis Date  . Abscess   . Anemia   . Chlamydia   . Depression   . Gonorrhea   . Graves disease    . Hypertension     Current Outpatient Medications  Medication Sig Dispense Refill  . levothyroxine (SYNTHROID) 50 MCG tablet Take 50 mcg by mouth daily before breakfast.    . propranolol (INDERAL) 10 MG tablet Take 10 mg by mouth 3 (three) times daily.    Marland Kitchen azithromycin (ZITHROMAX) 250 MG tablet Sig as indicated 6 tablet 0  . predniSONE (DELTASONE) 20 MG tablet Take 2 tablets (40 mg total) by mouth daily with breakfast for 5 days. 10 tablet 0  . promethazine-codeine (PHENERGAN WITH CODEINE) 6.25-10 MG/5ML syrup Take 5 mLs by mouth at bedtime as needed for cough. 120 mL 0  . propylthiouracil (PTU) 50 MG tablet Take 3 tablet in morning and 2 tablet in evening.     No current facility-administered medications for this visit.    Allergies  Allergen Reactions  . Percocet [Oxycodone-Acetaminophen] Nausea And Vomiting and Other (See Comments)    Sweating, pass out  . Vicodin [Hydrocodone-Acetaminophen] Nausea And Vomiting and Other (See Comments)    Sweating, pass out    Social History   Socioeconomic History  . Marital status: Single    Spouse name: Not on file  . Number of children: Not on file  . Years of education: Not on file  . Highest education level: Not on file  Occupational History  . Not on file  Tobacco Use  . Smoking status: Current Every Day Smoker    Packs/day: 0.50    Types: Cigarettes  . Smokeless tobacco: Current User  Substance and Sexual  Activity  . Alcohol use: No    Comment: occasional  . Drug use: Yes    Types: Marijuana    Comment: Last marijuana 09/01/15  . Sexual activity: Yes    Birth control/protection: None  Other Topics Concern  . Not on file  Social History Narrative  . Not on file   Social Determinants of Health   Financial Resource Strain:   . Difficulty of Paying Living Expenses: Not on file  Food Insecurity:   . Worried About Programme researcher, broadcasting/film/video in the Last Year: Not on file  . Ran Out of Food in the Last Year: Not on file   Transportation Needs:   . Lack of Transportation (Medical): Not on file  . Lack of Transportation (Non-Medical): Not on file  Physical Activity:   . Days of Exercise per Week: Not on file  . Minutes of Exercise per Session: Not on file  Stress:   . Feeling of Stress : Not on file  Social Connections:   . Frequency of Communication with Friends and Family: Not on file  . Frequency of Social Gatherings with Friends and Family: Not on file  . Attends Religious Services: Not on file  . Active Member of Clubs or Organizations: Not on file  . Attends Banker Meetings: Not on file  . Marital Status: Not on file  Intimate Partner Violence:   . Fear of Current or Ex-Partner: Not on file  . Emotionally Abused: Not on file  . Physically Abused: Not on file  . Sexually Abused: Not on file    Review of Systems  Constitutional: Positive for fever and malaise/fatigue.  HENT: Positive for congestion. Negative for sore throat.   Respiratory: Positive for cough and wheezing. Negative for hemoptysis and shortness of breath.   Cardiovascular: Negative for chest pain and palpitations.  Gastrointestinal: Positive for diarrhea. Negative for abdominal pain, blood in stool, melena, nausea and vomiting.  Genitourinary: Negative for dysuria.  Musculoskeletal: Positive for myalgias. Negative for back pain and neck pain.  Skin: Negative.  Negative for rash.  Neurological: Positive for headaches. Negative for dizziness.  All other systems reviewed and are negative.   Objective  Alert and oriented x3 in no apparent respiratory distress. Vitals as reported by the patient: Today's Vitals   09/05/19 1521  Weight: 105 lb (47.6 kg)  Height: 5\' 4"  (1.626 m)    Doreen was seen today for cough and fever.  Diagnoses and all orders for this visit:  Flu-like symptoms  Suspected COVID-19 virus infection  Cough -     Discontinue: promethazine-codeine (PHENERGAN WITH CODEINE) 6.25-10 MG/5ML  syrup; Take 5 mLs by mouth at bedtime as needed for cough. -     promethazine-codeine (PHENERGAN WITH CODEINE) 6.25-10 MG/5ML syrup; Take 5 mLs by mouth at bedtime as needed for cough.  Wheezing -     predniSONE (DELTASONE) 20 MG tablet; Take 2 tablets (40 mg total) by mouth daily with breakfast for 5 days.  Lower respiratory infection -     azithromycin (ZITHROMAX) 250 MG tablet; Sig as indicated   Covid precautions given.  ED precautions given.  Told her she most likely has a Covid infection.  Advised to contact the office if she gets worse or clinical picture changes in the next several days.  I discussed the assessment and treatment plan with the patient. The patient was provided an opportunity to ask questions and all were answered. The patient agreed with the plan and demonstrated  an understanding of the instructions.   The patient was advised to call back or seek an in-person evaluation if the symptoms worsen or if the condition fails to improve as anticipated.  I provided 15 minutes of non-face-to-face time during this encounter.  Georgina Quint, MD  Primary Care at [facility]

## 2019-09-18 ENCOUNTER — Ambulatory Visit: Payer: 59 | Admitting: Emergency Medicine

## 2019-10-07 DIAGNOSIS — E890 Postprocedural hypothyroidism: Secondary | ICD-10-CM | POA: Insufficient documentation

## 2019-12-19 ENCOUNTER — Encounter: Payer: Self-pay | Admitting: Emergency Medicine

## 2020-04-02 ENCOUNTER — Ambulatory Visit: Payer: 59 | Admitting: Family Medicine

## 2020-06-29 ENCOUNTER — Ambulatory Visit: Payer: 59 | Admitting: Emergency Medicine

## 2020-08-25 ENCOUNTER — Encounter: Payer: Self-pay | Admitting: Emergency Medicine

## 2020-08-26 ENCOUNTER — Encounter: Payer: Self-pay | Admitting: Family Medicine

## 2020-08-26 ENCOUNTER — Ambulatory Visit (INDEPENDENT_AMBULATORY_CARE_PROVIDER_SITE_OTHER): Payer: 59 | Admitting: Family Medicine

## 2020-08-26 ENCOUNTER — Other Ambulatory Visit: Payer: Self-pay

## 2020-08-26 VITALS — BP 141/99 | HR 62 | Temp 98.2°F | Ht 64.0 in | Wt 100.0 lb

## 2020-08-26 DIAGNOSIS — R309 Painful micturition, unspecified: Secondary | ICD-10-CM

## 2020-08-26 DIAGNOSIS — N898 Other specified noninflammatory disorders of vagina: Secondary | ICD-10-CM | POA: Diagnosis not present

## 2020-08-26 DIAGNOSIS — R300 Dysuria: Secondary | ICD-10-CM | POA: Diagnosis not present

## 2020-08-26 LAB — POCT URINALYSIS DIP (MANUAL ENTRY)
Glucose, UA: NEGATIVE mg/dL
Ketones, POC UA: NEGATIVE mg/dL
Nitrite, UA: NEGATIVE
Protein Ur, POC: 30 mg/dL — AB
Spec Grav, UA: >=1.03 — AB (ref 1.010–1.025)
Urobilinogen, UA: 0.2 E.U./dL
pH, UA: 6 (ref 5.0–8.0)

## 2020-08-26 MED ORDER — NITROFURANTOIN MONOHYD MACRO 100 MG PO CAPS: 100.0000 mg | ORAL_CAPSULE | Freq: Two times a day (BID) | ORAL | 0 refills | Status: AC

## 2020-08-26 NOTE — Progress Notes (Signed)
1/12/20229:17 AM  Sarah Fritz Dec 27, 1980, 40 y.o., female [id number]  Chief Complaint  Patient presents with  . urinary discomfort    Feels pressure w/ urination x 4 days  . Vaginal Discharge    HPI:   Patient is a 40 y.o. female with past medical history significant for hypothyroid and pain who presents today for possible UTI.   Recently switched brand of pads Symptoms started 5 days ago   Depression screen [facility] 2/9 08/26/2020 09/05/2019 03/18/2019  Decreased Interest 0 0 0  Down, Depressed, Hopeless 0 0 0  PHQ - 2 Score 0 0 0  Altered sleeping - - -  Tired, decreased energy - - -  Change in appetite - - -  Feeling bad or failure about yourself  - - -  Trouble concentrating - - -  Moving slowly or fidgety/restless - - -  Suicidal thoughts - - -  PHQ-9 Score - - -    Fall Risk  08/26/2020 09/05/2019 03/18/2019 12/27/2018 06/19/2018  Falls in the past year? 0 0 0 0 0  Number falls in past yr: 0 - - 0 -  Injury with Fall? 0 - - 0 -  Follow up Falls evaluation completed Falls evaluation completed Falls evaluation completed - -     Allergies  Allergen Reactions  . Percocet [Oxycodone-Acetaminophen] Nausea And Vomiting and Other (See Comments)    Sweating, pass out  . Vicodin [Hydrocodone-Acetaminophen] Nausea And Vomiting and Other (See Comments)    Sweating, pass out    Prior to Admission medications   Medication Sig Start Date End Date Taking? Authorizing Provider  levothyroxine (SYNTHROID) 50 MCG tablet Take 50 mcg by mouth daily before breakfast.   Yes [provider]  propranolol (INDERAL) 10 MG tablet Take 10 mg by mouth 3 (three) times daily. Patient not taking: Reported on 08/26/2020    [provider]    Past Medical History:  Diagnosis Date  . Abscess   . Anemia   . Chlamydia   . Depression   . Gonorrhea   . Graves disease   . Hypertension     Past Surgical History:  Procedure Laterality Date  . WISDOM TOOTH EXTRACTION       Social History   Tobacco Use  . Smoking status: Current Every Day Smoker    Packs/day: 0.50    Types: Cigarettes  . Smokeless tobacco: Current User  Substance Use Topics  . Alcohol use: No    Comment: occasional    Family History  Problem Relation Age of Onset  . Heart disease Mother   . Kidney disease Mother   . COPD Mother   . Cancer Mother   . Cancer Father   . COPD Father     Review of Systems  Constitutional: Negative for chills, fever and malaise/fatigue.  Eyes: Negative for blurred vision and double vision.  Respiratory: Negative for cough, shortness of breath and wheezing.   Cardiovascular: Negative for chest pain, palpitations and leg swelling.  Gastrointestinal: Negative for abdominal pain, blood in stool, constipation, diarrhea, heartburn, nausea and vomiting.  Genitourinary: Positive for dysuria. Negative for frequency, hematuria and urgency.       Vaginal discharge  Musculoskeletal: Negative for back pain and joint pain.  Skin: Negative for rash.  Neurological: Negative for dizziness, weakness and headaches.     OBJECTIVE:  Today's Vitals   08/26/20 0846  BP: (!) 141/99  Pulse: 62  Temp: 98.2 F (36.8 C)  SpO2: 98%  Weight: 100 lb (45.4 kg)  Height: 5\' 4"  (1.626 m)   Body mass index is 17.16 kg/m.   Physical Exam Constitutional:      General: She is not in acute distress.    Appearance: Normal appearance. She is not ill-appearing.  HENT:     Head: Normocephalic.  Cardiovascular:     Rate and Rhythm: Normal rate and regular rhythm.     Pulses: Normal pulses.     Heart sounds: Normal heart sounds. No murmur heard. No friction rub. No gallop.   Pulmonary:     Effort: Pulmonary effort is normal. No respiratory distress.     Breath sounds: Normal breath sounds. No stridor. No wheezing, rhonchi or rales.  Abdominal:     General: Bowel sounds are normal. There is no distension.     Palpations: Abdomen is soft.     Tenderness: There is  no abdominal tenderness. There is no right CVA tenderness, left CVA tenderness or guarding.  Musculoskeletal:     Right lower leg: No edema.     Left lower leg: No edema.  Skin:    General: Skin is warm and dry.  Neurological:     Mental Status: She is alert and oriented to person, place, and time.  Psychiatric:        Mood and Affect: Mood normal.        Behavior: Behavior normal.     Results for orders placed or performed in visit on 08/26/20 (from the past 24 hour(s))  POCT urinalysis dipstick     Status: Abnormal   Collection Time: 08/26/20  9:06 AM  Result Value Ref Range   Color, UA yellow yellow   Clarity, UA clear clear   Glucose, UA negative negative mg/dL   Bilirubin, UA small (A) negative   Ketones, POC UA negative negative mg/dL   Spec Grav, UA 10/24/20 (A) 1.010 - 1.025   Blood, UA large (A) negative   pH, UA 6.0 5.0 - 8.0   Protein Ur, POC =30 (A) negative mg/dL   Urobilinogen, UA 0.2 0.2 or 1.0 E.U./dL   Nitrite, UA Negative Negative   Leukocytes, UA Moderate (2+) (A) Negative    No results found.   ASSESSMENT and PLAN  Problem List Items Addressed This Visit   None   Visit Diagnoses    Dysuria    -  Primary   Relevant Medications   nitrofurantoin, macrocrystal-monohydrate, (MACROBID) 100 MG capsule   Other Relevant Orders   POCT urinalysis dipstick (Completed)   Urine Culture   Vaginal discharge       Relevant Orders   WET PREP FOR TRICH, YEAST, CLUE   Pain with urination          Return if symptoms worsen or fail to improve.   >=1.275 Sarah Maroney, FNP-BC Primary Care at [facility] [address] [place], [place] Ph.  [phone] Fax [phone]

## 2020-08-26 NOTE — Patient Instructions (Addendum)
Urinary Tract Infection, Adult A urinary tract infection (UTI) is an infection of any part of the urinary tract. The urinary tract includes:  The kidneys.  The ureters.  The bladder.  The urethra. These organs make, store, and get rid of pee (urine) in the body. What are the causes? This infection is caused by germs (bacteria) in your genital area. These germs grow and cause swelling (inflammation) of your urinary tract. What increases the risk? The following factors may make you more likely to develop this condition:  Using a small, thin tube (catheter) to drain pee.  Not being able to control when you pee or poop (incontinence).  Being female. If you are female, these things can increase the risk: ? Using these methods to prevent pregnancy:  A medicine that kills sperm (spermicide).  A device that blocks sperm (diaphragm). ? Having low levels of a female hormone (estrogen). ? Being pregnant. You are more likely to develop this condition if:  You have genes that add to your risk.  You are sexually active.  You take antibiotic medicines.  You have trouble peeing because of: ? A prostate that is bigger than normal, if you are female. ? A blockage in the part of your body that drains pee from the bladder. ? A kidney stone. ? A nerve condition that affects your bladder. ? Not getting enough to drink. ? Not peeing often enough.  You have other conditions, such as: ? Diabetes. ? A weak disease-fighting system (immune system). ? Sickle cell disease. ? Gout. ? Injury of the spine. What are the signs or symptoms? Symptoms of this condition include:  Needing to pee right away.  Peeing small amounts often.  Pain or burning when peeing.  Blood in the pee.  Pee that smells bad or not like normal.  Trouble peeing.  Pee that is cloudy.  Fluid coming from the vagina, if you are female.  Pain in the belly or lower back. Other symptoms include:  Vomiting.  Not  feeling hungry.  Feeling mixed up (confused). This may be the first symptom in older adults.  Being tired and grouchy (irritable).  A fever.  Watery poop (diarrhea). How is this treated?  Taking antibiotic medicine.  Taking other medicines.  Drinking enough water. In some cases, you may need to see a specialist. Follow these instructions at home: Medicines  Take over-the-counter and prescription medicines only as told by your doctor.  If you were prescribed an antibiotic medicine, take it as told by your doctor. Do not stop taking it even if you start to feel better. General instructions  Make sure you: ? Pee until your bladder is empty. ? Do not hold pee for a long time. ? Empty your bladder after sex. ? Wipe from front to back after peeing or pooping if you are a female. Use each tissue one time when you wipe.  Drink enough fluid to keep your pee pale yellow.  Keep all follow-up visits.   Contact a doctor if:  You do not get better after 1-2 days.  Your symptoms go away and then come back. Get help right away if:  You have very bad back pain.  You have very bad pain in your lower belly.  You have a fever.  You have chills.  You feeling like you will vomit or you vomit. Summary  A urinary tract infection (UTI) is an infection of any part of the urinary tract.  This condition is caused by   germs in your genital area.  There are many risk factors for a UTI.  Treatment includes antibiotic medicines.  Drink enough fluid to keep your pee pale yellow. This information is not intended to replace advice given to you by your health care provider. Make sure you discuss any questions you have with your health care provider. Document Revised: 03/13/2020 Document Reviewed: 03/13/2020 Elsevier Patient Education  2021 Elsevier Inc.     If you have lab work done today you will be contacted with your lab results within the next 2 weeks.  If you have not heard from  us then please contact us. The fastest way to get your results is to register for My Chart.   IF you received an x-ray today, you will receive an invoice from [facility]. Please contact [facility] at [phone] with questions or concerns regarding your invoice.   IF you received labwork today, you will receive an invoice from [facility]. Please contact [facility] at [phone] with questions or concerns regarding your invoice.   Our billing staff will not be able to assist you with questions regarding bills from these companies.  You will be contacted with the lab results as soon as they are available. The fastest way to get your results is to activate your My Chart account. Instructions are located on the last page of this paperwork. If you have not heard from us regarding the results in 2 weeks, please contact this office.      

## 2020-08-27 ENCOUNTER — Other Ambulatory Visit: Payer: Self-pay | Admitting: Family Medicine

## 2020-08-27 DIAGNOSIS — B379 Candidiasis, unspecified: Secondary | ICD-10-CM

## 2020-08-27 LAB — WET PREP FOR TRICH, YEAST, CLUE
Clue Cell Exam: NEGATIVE
Trichomonas Exam: NEGATIVE
Yeast Exam: POSITIVE — AB

## 2020-08-27 MED ORDER — FLUCONAZOLE 150 MG PO TABS: 150.0000 mg | ORAL_TABLET | Freq: Once | ORAL | 0 refills | Status: AC

## 2020-08-28 LAB — URINE CULTURE

## 2020-08-29 LAB — URINE CULTURE

## 2020-08-30 ENCOUNTER — Encounter: Payer: Self-pay | Admitting: Family Medicine

## 2020-08-30 DIAGNOSIS — R309 Painful micturition, unspecified: Secondary | ICD-10-CM

## 2020-08-31 ENCOUNTER — Other Ambulatory Visit: Payer: Self-pay | Admitting: Family Medicine

## 2020-08-31 DIAGNOSIS — N3000 Acute cystitis without hematuria: Secondary | ICD-10-CM

## 2020-08-31 MED ORDER — SULFAMETHOXAZOLE-TRIMETHOPRIM 800-160 MG PO TABS: 1.0000 | ORAL_TABLET | Freq: Two times a day (BID) | ORAL | 0 refills | Status: AC

## 2020-11-29 ENCOUNTER — Encounter: Payer: Self-pay | Admitting: Emergency Medicine

## 2020-11-30 ENCOUNTER — Encounter: Payer: 59 | Admitting: Emergency Medicine

## 2020-11-30 DIAGNOSIS — E0500 Thyrotoxicosis with diffuse goiter without thyrotoxic crisis or storm: Secondary | ICD-10-CM

## 2020-11-30 DIAGNOSIS — G894 Chronic pain syndrome: Secondary | ICD-10-CM

## 2020-12-22 ENCOUNTER — Encounter: Payer: 59 | Admitting: Emergency Medicine

## 2021-01-04 ENCOUNTER — Encounter: Payer: Self-pay | Admitting: Emergency Medicine

## 2021-01-05 ENCOUNTER — Other Ambulatory Visit: Payer: Self-pay | Admitting: Emergency Medicine

## 2021-01-05 DIAGNOSIS — N63 Unspecified lump in unspecified breast: Secondary | ICD-10-CM

## 2021-01-05 NOTE — Telephone Encounter (Signed)
Order for diagnostic mammogram placed.  

## 2021-01-08 ENCOUNTER — Other Ambulatory Visit: Payer: Self-pay | Admitting: Emergency Medicine

## 2021-01-08 DIAGNOSIS — N63 Unspecified lump in unspecified breast: Secondary | ICD-10-CM

## 2021-02-03 ENCOUNTER — Ambulatory Visit: Payer: 59 | Admitting: Emergency Medicine

## 2021-02-18 ENCOUNTER — Ambulatory Visit (INDEPENDENT_AMBULATORY_CARE_PROVIDER_SITE_OTHER): Payer: 59 | Admitting: Emergency Medicine

## 2021-02-18 ENCOUNTER — Ambulatory Visit
Admission: RE | Admit: 2021-02-18 | Discharge: 2021-02-18 | Disposition: A | Discharge status: Home / Self Care | Payer: 59 | Source: Ambulatory Visit | Attending: Emergency Medicine | Admitting: Emergency Medicine

## 2021-02-18 ENCOUNTER — Ambulatory Visit: Payer: 59

## 2021-02-18 ENCOUNTER — Encounter: Payer: Self-pay | Admitting: Emergency Medicine

## 2021-02-18 ENCOUNTER — Other Ambulatory Visit: Payer: Self-pay

## 2021-02-18 VITALS — BP 116/80 | HR 86 | Temp 98.2°F | Ht 64.0 in | Wt 97.0 lb

## 2021-02-18 DIAGNOSIS — Z23 Encounter for immunization: Secondary | ICD-10-CM

## 2021-02-18 DIAGNOSIS — Z0000 Encounter for general adult medical examination without abnormal findings: Secondary | ICD-10-CM

## 2021-02-18 DIAGNOSIS — E0500 Thyrotoxicosis with diffuse goiter without thyrotoxic crisis or storm: Secondary | ICD-10-CM | POA: Diagnosis not present

## 2021-02-18 DIAGNOSIS — N63 Unspecified lump in unspecified breast: Secondary | ICD-10-CM

## 2021-02-18 DIAGNOSIS — E0590 Thyrotoxicosis, unspecified without thyrotoxic crisis or storm: Secondary | ICD-10-CM

## 2021-02-18 NOTE — Progress Notes (Signed)
Sarah Fritz 40 y.o.   Chief Complaint  Patient presents with   Annual Exam    HISTORY OF PRESENT ILLNESS: This is a 40 y.o. female here for annual exam. Past medical history of Graves' disease.  Sees Dr. Allena Fritz on a regular basis. Getting blood work done today.  Presently on Synthroid 50 mcg daily. Still smoking. Overall feeling better.  Has no complaints or medical concerns today.  HPI   Prior to Admission medications   Medication Sig Start Date End Date Taking? Authorizing Provider  levothyroxine (SYNTHROID) 50 MCG tablet Take 50 mcg by mouth daily before breakfast.   Yes [provider]  propranolol (INDERAL) 10 MG tablet Take 10 mg by mouth 3 (three) times daily. Patient not taking: No sig reported    [provider]    Allergies  Allergen Reactions   Percocet [Oxycodone-Acetaminophen] Nausea And Vomiting and Other (See Comments)    Sweating, pass out   Vicodin [Hydrocodone-Acetaminophen] Nausea And Vomiting and Other (See Comments)    Sweating, pass out    Patient Active Problem List   Diagnosis Date Noted   Graves disease 07/27/2016   Hyperthyroidism 07/14/2016   Chronic pain syndrome 07/14/2016   Smoking 07/14/2016   Recurrent UTI 04/18/2013    Past Medical History:  Diagnosis Date   Abscess    Anemia    Chlamydia    Depression    Gonorrhea    Graves disease    Hypertension     Past Surgical History:  Procedure Laterality Date   WISDOM TOOTH EXTRACTION      Social History   Socioeconomic History   Marital status: Single    Spouse name: Not on file   Number of children: Not on file   Years of education: Not on file   Highest education level: Not on file  Occupational History   Not on file  Tobacco Use   Smoking status: Every Day    Packs/day: 0.50    Pack years: 0.00    Types: Cigarettes   Smokeless tobacco: Current  Vaping Use   Vaping Use: Never used  Substance and Sexual Activity   Alcohol use: No     Comment: occasional   Drug use: Yes    Types: Marijuana    Comment: Last marijuana 09/01/15   Sexual activity: Yes    Birth control/protection: None  Other Topics Concern   Not on file  Social History Narrative   Not on file   Social Determinants of Health   Financial Resource Strain: Not on file  Food Insecurity: Not on file  Transportation Needs: Not on file  Physical Activity: Not on file  Stress: Not on file  Social Connections: Not on file  Intimate Partner Violence: Not on file    Family History  Problem Relation Age of Onset   Heart disease Mother    Kidney disease Mother    COPD Mother    Cancer Mother    Cancer Father    COPD Father      Review of Systems  Constitutional: Negative.  Negative for chills and fever.  HENT: Negative.  Negative for congestion and sore throat.   Respiratory: Negative.  Negative for cough and shortness of breath.   Cardiovascular: Negative.  Negative for chest pain and palpitations.  Gastrointestinal: Negative.  Negative for abdominal pain, diarrhea, nausea and vomiting.  Genitourinary: Negative.  Negative for dysuria and hematuria.  Musculoskeletal: Negative.   Skin: Negative.  Negative for rash.  Neurological: Negative.  Negative for dizziness and headaches.  All other systems reviewed and are negative.   Physical Exam Vitals reviewed.  Constitutional:      Appearance: Normal appearance.  HENT:     Head: Normocephalic.  Eyes:     Extraocular Movements: Extraocular movements intact.     Conjunctiva/sclera: Conjunctivae normal.     Pupils: Pupils are equal, round, and reactive to light.  Cardiovascular:     Rate and Rhythm: Normal rate and regular rhythm.     Pulses: Normal pulses.     Heart sounds: Normal heart sounds.  Pulmonary:     Effort: Pulmonary effort is normal.     Breath sounds: Normal breath sounds.  Abdominal:     Palpations: Abdomen is soft. There is no mass.     Tenderness: There is no abdominal  tenderness.  Musculoskeletal:        General: Normal range of motion.     Cervical back: Normal range of motion and neck supple.     Right lower leg: No edema.     Left lower leg: No edema.  Skin:    General: Skin is warm and dry.  Neurological:     General: No focal deficit present.     Mental Status: She is alert and oriented to person, place, and time.  Psychiatric:        Mood and Affect: Mood normal.        Behavior: Behavior normal.     ASSESSMENT & PLAN: Sarah Fritz was seen today for annual exam.  Diagnoses and all orders for this visit:  Routine general medical examination at a health care facility  Need for Tdap vaccination -     Tdap vaccine greater than or equal to 7yo IM  Hyperthyroidism  Graves disease  Modifiable risk factors discussed with patient. Anticipatory guidance according to age provided. The following topics were also discussed: Social Determinants of Health Smoking cardiovascular and cancer risks and smoking cessation advice given Diet and nutrition Benefits of exercise Cancer screening Vaccinations recommendations Cardiovascular risk assessment Mental health including depression and anxiety Fall and accident prevention  Patient Instructions  Health Maintenance, Female Adopting a healthy lifestyle and getting preventive care are important in promoting health and wellness. Ask your health care provider about: The right schedule for you to have regular tests and exams. Things you can do on your own to prevent diseases and keep yourself healthy. What should I know about diet, weight, and exercise? Eat a healthy diet  Eat a diet that includes plenty of vegetables, fruits, low-fat dairy products, and lean protein. Do not eat a lot of foods that are high in solid fats, added sugars, or sodium.  Maintain a healthy weight Body mass index (BMI) is used to identify weight problems. It estimates body fat based on height and weight. Your health care  provider can help determineyour BMI and help you achieve or maintain a healthy weight. Get regular exercise Get regular exercise. This is one of the most important things you can do for your health. Most adults should: Exercise for at least 150 minutes each week. The exercise should increase your heart rate and make you sweat (moderate-intensity exercise). Do strengthening exercises at least twice a week. This is in addition to the moderate-intensity exercise. Spend less time sitting. Even light physical activity can be beneficial. Watch cholesterol and blood lipids Have your blood tested for lipids and cholesterol at 41 years of age, then havethis test every 5  years. Have your cholesterol levels checked more often if: Your lipid or cholesterol levels are high. You are older than 41 years of age. You are at high risk for heart disease. What should I know about cancer screening? Depending on your health history and family history, you may need to have cancer screening at various ages. This may include screening for: Breast cancer. Cervical cancer. Colorectal cancer. Skin cancer. Lung cancer. What should I know about heart disease, diabetes, and high blood pressure? Blood pressure and heart disease High blood pressure causes heart disease and increases the risk of stroke. This is more likely to develop in people who have high blood pressure readings, are of African descent, or are overweight. Have your blood pressure checked: Every 3-5 years if you are 4518-41 years of age. Every year if you are 41 years old or older. Diabetes Have regular diabetes screenings. This checks your fasting blood sugar level. Have the screening done: Once every three years after age 41 if you are at a normal weight and have a low risk for diabetes. More often and at a younger age if you are overweight or have a high risk for diabetes. What should I know about preventing infection? Hepatitis B If you have a  higher risk for hepatitis B, you should be screened for this virus. Talk with your health care provider to find out if you are at risk forhepatitis B infection. Hepatitis C Testing is recommended for: Everyone born from [id number] through 1965. Anyone with known risk factors for hepatitis C. Sexually transmitted infections (STIs) Get screened for STIs, including gonorrhea and chlamydia, if: You are sexually active and are younger than 41 years of age. You are older than 41 years of age and your health care provider tells you that you are at risk for this type of infection. Your sexual activity has changed since you were last screened, and you are at increased risk for chlamydia or gonorrhea. Ask your health care provider if you are at risk. Ask your health care provider about whether you are at high risk for HIV. Your health care provider may recommend a prescription medicine to help prevent HIV infection. If you choose to take medicine to prevent HIV, you should first get tested for HIV. You should then be tested every 3 months for as long as you are taking the medicine. Pregnancy If you are about to stop having your period (premenopausal) and you may become pregnant, seek counseling before you get pregnant. Take 400 to 800 micrograms (mcg) of folic acid every day if you become pregnant. Ask for birth control (contraception) if you want to prevent pregnancy. Osteoporosis and menopause Osteoporosis is a disease in which the bones lose minerals and strength with aging. This can result in bone fractures. If you are 176 years old or older, or if you are at risk for osteoporosis and fractures, ask your health care provider if you should: Be screened for bone loss. Take a calcium or vitamin D supplement to lower your risk of fractures. Be given hormone replacement therapy (HRT) to treat symptoms of menopause. Follow these instructions at home: Lifestyle Do not use any products that contain nicotine or  tobacco, such as cigarettes, e-cigarettes, and chewing tobacco. If you need help quitting, ask your health care provider. Do not use street drugs. Do not share needles. Ask your health care provider for help if you need support or information about quitting drugs. Alcohol use Do not drink alcohol if: Your health  care provider tells you not to drink. You are pregnant, may be pregnant, or are planning to become pregnant. If you drink alcohol: Limit how much you use to 0-1 drink a day. Limit intake if you are breastfeeding. Be aware of how much alcohol is in your drink. In the [place] one drink equals one 12 oz bottle of beer (355 mL), one 5 oz glass of wine (148 mL), or one 1 oz glass of hard liquor (44 mL). General instructions Schedule regular health, dental, and eye exams. Stay current with your vaccines. Tell your health care provider if: You often feel depressed. You have ever been abused or do not feel safe at home. Summary Adopting a healthy lifestyle and getting preventive care are important in promoting health and wellness. Follow your health care provider's instructions about healthy diet, exercising, and getting tested or screened for diseases. Follow your health care provider's instructions on monitoring your cholesterol and blood pressure. This information is not intended to replace advice given to you by your health care provider. Make sure you discuss any questions you have with your healthcare provider. Document Revised: 07/25/2018 Document Reviewed: 07/25/2018 Elsevier Patient Education  2022 Elsevier Inc.   Edwina Barth, MD [facility]

## 2021-02-18 NOTE — Patient Instructions (Signed)
Health Maintenance, Female Adopting a healthy lifestyle and getting preventive care are important in promoting health and wellness. Ask your health care provider about: The right schedule for you to have regular tests and exams. Things you can do on your own to prevent diseases and keep yourself healthy. What should I know about diet, weight, and exercise? Eat a healthy diet  Eat a diet that includes plenty of vegetables, fruits, low-fat dairy products, and lean protein. Do not eat a lot of foods that are high in solid fats, added sugars, or sodium.  Maintain a healthy weight Body mass index (BMI) is used to identify weight problems. It estimates body fat based on height and weight. Your health care provider can help determineyour BMI and help you achieve or maintain a healthy weight. Get regular exercise Get regular exercise. This is one of the most important things you can do for your health. Most adults should: Exercise for at least 150 minutes each week. The exercise should increase your heart rate and make you sweat (moderate-intensity exercise). Do strengthening exercises at least twice a week. This is in addition to the moderate-intensity exercise. Spend less time sitting. Even light physical activity can be beneficial. Watch cholesterol and blood lipids Have your blood tested for lipids and cholesterol at 41 years of age, then havethis test every 5 years. Have your cholesterol levels checked more often if: Your lipid or cholesterol levels are high. You are older than 40 years of age. You are at high risk for heart disease. What should I know about cancer screening? Depending on your health history and family history, you may need to have cancer screening at various ages. This may include screening for: Breast cancer. Cervical cancer. Colorectal cancer. Skin cancer. Lung cancer. What should I know about heart disease, diabetes, and high blood pressure? Blood pressure and heart  disease High blood pressure causes heart disease and increases the risk of stroke. This is more likely to develop in people who have high blood pressure readings, are of African descent, or are overweight. Have your blood pressure checked: Every 3-5 years if you are 18-39 years of age. Every year if you are 40 years old or older. Diabetes Have regular diabetes screenings. This checks your fasting blood sugar level. Have the screening done: Once every three years after age 40 if you are at a normal weight and have a low risk for diabetes. More often and at a younger age if you are overweight or have a high risk for diabetes. What should I know about preventing infection? Hepatitis B If you have a higher risk for hepatitis B, you should be screened for this virus. Talk with your health care provider to find out if you are at risk forhepatitis B infection. Hepatitis C Testing is recommended for: Everyone born from 1945 through 1965. Anyone with known risk factors for hepatitis C. Sexually transmitted infections (STIs) Get screened for STIs, including gonorrhea and chlamydia, if: You are sexually active and are younger than 41 years of age. You are older than 41 years of age and your health care provider tells you that you are at risk for this type of infection. Your sexual activity has changed since you were last screened, and you are at increased risk for chlamydia or gonorrhea. Ask your health care provider if you are at risk. Ask your health care provider about whether you are at high risk for HIV. Your health care provider may recommend a prescription medicine to help   prevent HIV infection. If you choose to take medicine to prevent HIV, you should first get tested for HIV. You should then be tested every 3 months for as long as you are taking the medicine. Pregnancy If you are about to stop having your period (premenopausal) and you may become pregnant, seek counseling before you get  pregnant. Take 400 to 800 micrograms (mcg) of folic acid every day if you become pregnant. Ask for birth control (contraception) if you want to prevent pregnancy. Osteoporosis and menopause Osteoporosis is a disease in which the bones lose minerals and strength with aging. This can result in bone fractures. If you are 65 years old or older, or if you are at risk for osteoporosis and fractures, ask your health care provider if you should: Be screened for bone loss. Take a calcium or vitamin D supplement to lower your risk of fractures. Be given hormone replacement therapy (HRT) to treat symptoms of menopause. Follow these instructions at home: Lifestyle Do not use any products that contain nicotine or tobacco, such as cigarettes, e-cigarettes, and chewing tobacco. If you need help quitting, ask your health care provider. Do not use street drugs. Do not share needles. Ask your health care provider for help if you need support or information about quitting drugs. Alcohol use Do not drink alcohol if: Your health care provider tells you not to drink. You are pregnant, may be pregnant, or are planning to become pregnant. If you drink alcohol: Limit how much you use to 0-1 drink a day. Limit intake if you are breastfeeding. Be aware of how much alcohol is in your drink. In the [place] one drink equals one 12 oz bottle of beer (355 mL), one 5 oz glass of wine (148 mL), or one 1 oz glass of hard liquor (44 mL). General instructions Schedule regular health, dental, and eye exams. Stay current with your vaccines. Tell your health care provider if: You often feel depressed. You have ever been abused or do not feel safe at home. Summary Adopting a healthy lifestyle and getting preventive care are important in promoting health and wellness. Follow your health care provider's instructions about healthy diet, exercising, and getting tested or screened for diseases. Follow your health care provider's  instructions on monitoring your cholesterol and blood pressure. This information is not intended to replace advice given to you by your health care provider. Make sure you discuss any questions you have with your healthcare provider. Document Revised: 07/25/2018 Document Reviewed: 07/25/2018 Elsevier Patient Education  2022 [employer] Inc.  

## 2021-03-01 DIAGNOSIS — Z1231 Encounter for screening mammogram for malignant neoplasm of breast: Secondary | ICD-10-CM

## 2021-09-01 ENCOUNTER — Encounter: Payer: Self-pay | Admitting: Emergency Medicine

## 2021-09-01 ENCOUNTER — Telehealth (INDEPENDENT_AMBULATORY_CARE_PROVIDER_SITE_OTHER): Payer: 59 | Admitting: Adult Health

## 2021-09-01 ENCOUNTER — Encounter: Payer: Self-pay | Admitting: Adult Health

## 2021-09-01 VITALS — Temp 97.6°F | Ht 64.0 in

## 2021-09-01 DIAGNOSIS — J22 Unspecified acute lower respiratory infection: Secondary | ICD-10-CM

## 2021-09-01 DIAGNOSIS — R051 Acute cough: Secondary | ICD-10-CM | POA: Diagnosis not present

## 2021-09-01 DIAGNOSIS — Z720 Tobacco use: Secondary | ICD-10-CM | POA: Diagnosis not present

## 2021-09-01 LAB — POCT INFLUENZA A/B
Influenza A, POC: NEGATIVE
Influenza B, POC: NEGATIVE

## 2021-09-01 LAB — POC COVID19 BINAXNOW: SARS Coronavirus 2 Ag: NEGATIVE

## 2021-09-01 MED ORDER — BENZONATATE 200 MG PO CAPS: 200.0000 mg | ORAL_CAPSULE | Freq: Two times a day (BID) | ORAL | 0 refills | Status: DC | PRN

## 2021-09-01 MED ORDER — PREDNISONE 10 MG PO TABS: ORAL_TABLET | ORAL | 0 refills | Status: DC

## 2021-09-01 MED ORDER — DOXYCYCLINE HYCLATE 100 MG PO CAPS: 100.0000 mg | ORAL_CAPSULE | Freq: Two times a day (BID) | ORAL | 0 refills | Status: DC

## 2021-09-01 NOTE — Progress Notes (Signed)
Virtual Visit via Video Note  I connected with Sarah Fritz on 09/01/21 at  4:00 PM EST by a video enabled telemedicine application and verified that I am speaking with the correct person using two identifiers.  Location patient: home Location provider:work or home office Persons participating in the virtual visit: patient, provider  I discussed the limitations of evaluation and management by telemedicine and the availability of in person appointments. The patient expressed understanding and agreed to proceed.   HPI: 41 year old female who is being seen today for an acute issue.  Her symptoms started roughly 4 days ago and seem to be getting worse.  Symptoms include productive cough with white cloudy mucus, body aches, nasal congestion, decreased appetite, fatigue, sinus pressure with white-colored nasal drainage, shortness of breath, wheezing, night sweats.  She does smoke cigarettes.  Has been using various over-the-counter medications without relief.   ROS: See pertinent positives and negatives per HPI.  Past Medical History:  Diagnosis Date   Abscess    Anemia    Chlamydia    Depression    Gonorrhea    Graves disease    Hypertension     Past Surgical History:  Procedure Laterality Date   WISDOM TOOTH EXTRACTION      Family History  Problem Relation Age of Onset   Heart disease Mother    Kidney disease Mother    COPD Mother    Cancer Mother    Cancer Father    COPD Father        Current Outpatient Medications:    benzonatate (TESSALON) 200 MG capsule, Take 1 capsule (200 mg total) by mouth 2 (two) times daily as needed for cough., Disp: 20 capsule, Rfl: 0   doxycycline (VIBRAMYCIN) 100 MG capsule, Take 1 capsule (100 mg total) by mouth 2 (two) times daily., Disp: 14 capsule, Rfl: 0   levothyroxine (SYNTHROID) 50 MCG tablet, Take 50 mcg by mouth daily before breakfast., Disp: , Rfl:    predniSONE (DELTASONE) 10 MG tablet, 40 mg x 3 days, 20 mg x 3 days,  10 mg x 3 days, Disp: 21 tablet, Rfl: 0   propranolol (INDERAL) 10 MG tablet, Take 10 mg by mouth 3 (three) times daily., Disp: , Rfl:   EXAM:  VITALS per patient if applicable:  GENERAL: alert, oriented, appears well and in no acute distress  HEENT: atraumatic, conjunttiva clear, no obvious abnormalities on inspection of external nose and ears  NECK: normal movements of the head and neck  LUNGS: on inspection no signs of respiratory distress, breathing rate appears normal, no obvious gross SOB, gasping or wheezing  CV: no obvious cyanosis  MS: moves all visible extremities without noticeable abnormality  PSYCH/NEURO: pleasant and cooperative, no obvious depression or anxiety, speech and thought processing grossly intact  ASSESSMENT AND PLAN:  Discussed the following assessment and plan:  1. Lower respiratory infection -POC COVID and flu were negative, this was done prior to visit at our office -We will use doxycycline to cover for possible pneumonia as well as possible sinusitis.  Also prescribed prednisone and Tessalon Perles.  Advise rest and hydration.  Follow-up if no improvement in the next 2 to 3 days or sooner if symptoms worsen - doxycycline (VIBRAMYCIN) 100 MG capsule; Take 1 capsule (100 mg total) by mouth 2 (two) times daily.  Dispense: 14 capsule; Refill: 0 - predniSONE (DELTASONE) 10 MG tablet; 40 mg x 3 days, 20 mg x 3 days, 10 mg x 3 days  Dispense:  21 tablet; Refill: 0 - benzonatate (TESSALON) 200 MG capsule; Take 1 capsule (200 mg total) by mouth 2 (two) times daily as needed for cough.  Dispense: 20 capsule; Refill: 0  2. Acute cough  - benzonatate (TESSALON) 200 MG capsule; Take 1 capsule (200 mg total) by mouth 2 (two) times daily as needed for cough.  Dispense: 20 capsule; Refill: 0  3. Tobacco use -Encouraged to quit smoking    I discussed the assessment and treatment plan with the patient. The patient was provided an opportunity to ask questions and  all were answered. The patient agreed with the plan and demonstrated an understanding of the instructions.   The patient was advised to call back or seek an in-person evaluation if the symptoms worsen or if the condition fails to improve as anticipated.   Shirline Frees, NP

## 2021-09-16 ENCOUNTER — Ambulatory Visit: Payer: 59 | Admitting: Emergency Medicine

## 2021-09-21 ENCOUNTER — Other Ambulatory Visit: Payer: Self-pay

## 2021-09-21 ENCOUNTER — Ambulatory Visit
Admission: EM | Admit: 2021-09-21 | Discharge: 2021-09-21 | Disposition: A | Discharge status: Home / Self Care | Payer: 59 | Attending: Emergency Medicine | Admitting: Emergency Medicine

## 2021-09-21 DIAGNOSIS — Z20822 Contact with and (suspected) exposure to covid-19: Secondary | ICD-10-CM

## 2021-09-21 NOTE — ED Triage Notes (Signed)
Pt present covid exposure from spouse who tested positive yesterday. Pt denies any symptoms at this time.

## 2021-09-21 NOTE — Discharge Instructions (Signed)
Lab results will be available in about 2-3 days. We will contact you for any positive results, results will be available on MyChart also.  °

## 2021-09-22 LAB — NOVEL CORONAVIRUS, NAA: SARS-CoV-2, NAA: DETECTED — AB

## 2021-09-22 LAB — SARS-COV-2, NAA 2 DAY TAT

## 2021-11-16 ENCOUNTER — Other Ambulatory Visit: Payer: Self-pay

## 2021-11-16 ENCOUNTER — Emergency Department (HOSPITAL_COMMUNITY)
Admission: EM | Admit: 2021-11-16 | Discharge: 2021-11-17 | Disposition: A | Discharge status: Home / Self Care | Payer: 59 | Attending: Student | Admitting: Student

## 2021-11-16 ENCOUNTER — Encounter (HOSPITAL_COMMUNITY): Payer: Self-pay

## 2021-11-16 DIAGNOSIS — Z5321 Procedure and treatment not carried out due to patient leaving prior to being seen by health care provider: Secondary | ICD-10-CM | POA: Diagnosis not present

## 2021-11-16 DIAGNOSIS — R55 Syncope and collapse: Secondary | ICD-10-CM | POA: Diagnosis not present

## 2021-11-16 DIAGNOSIS — R42 Dizziness and giddiness: Secondary | ICD-10-CM | POA: Diagnosis present

## 2021-11-16 LAB — BASIC METABOLIC PANEL
Anion gap: 6 (ref 5–15)
BUN: 7 mg/dL (ref 6–20)
CO2: 23 mmol/L (ref 22–32)
Calcium: 9.3 mg/dL (ref 8.9–10.3)
Chloride: 109 mmol/L (ref 98–111)
Creatinine, Ser: 0.75 mg/dL (ref 0.44–1.00)
GFR, Estimated: >60 mL/min (ref >60)
Glucose, Bld: 101 mg/dL — ABNORMAL HIGH (ref 70–99)
Potassium: 4.2 mmol/L (ref 3.5–5.1)
Sodium: 138 mmol/L (ref 135–145)

## 2021-11-16 LAB — URINALYSIS, ROUTINE W REFLEX MICROSCOPIC
Bilirubin Urine: NEGATIVE
Glucose, UA: NEGATIVE mg/dL
Hgb urine dipstick: NEGATIVE
Ketones, ur: 20 mg/dL — AB
Nitrite: NEGATIVE
Protein, ur: NEGATIVE mg/dL
Specific Gravity, Urine: 1.013 (ref 1.005–1.030)
pH: 6 (ref 5.0–8.0)

## 2021-11-16 LAB — I-STAT BETA HCG BLOOD, ED (MC, WL, AP ONLY): I-stat hCG, quantitative: <5 m[IU]/mL (ref <5)

## 2021-11-16 LAB — CBG MONITORING, ED: Glucose-Capillary: 99 mg/dL (ref 70–99)

## 2021-11-16 LAB — CBC
HCT: 35.3 % — ABNORMAL LOW (ref 36.0–46.0)
Hemoglobin: 11.7 g/dL — ABNORMAL LOW (ref 12.0–15.0)
MCH: 24.9 pg — ABNORMAL LOW (ref 26.0–34.0)
MCHC: 33.1 g/dL (ref 30.0–36.0)
MCV: 75.1 fL — ABNORMAL LOW (ref 80.0–100.0)
Platelets: 236 10*3/uL (ref 150–400)
RBC: 4.7 MIL/uL (ref 3.87–5.11)
RDW: 15.3 % (ref 11.5–15.5)
WBC: 10 10*3/uL (ref 4.0–10.5)
nRBC: 0 % (ref 0.0–0.2)

## 2021-11-16 NOTE — ED Triage Notes (Signed)
Pts visitor reports that when she passed out her whole body locked up and started snoring. She was also incontinent of bowel and bladder. No hx of seizures. ?

## 2021-11-16 NOTE — ED Notes (Signed)
Patient states she feels better and is going to follow up with PCP. EMS IV removed by this NT ?

## 2021-11-16 NOTE — ED Triage Notes (Signed)
PER EMS: pt is from home with c/o dizziness and syncopal episode (~15 seconds per bystanders).  She sitting on the couch when she passed out. She reports she has not eaten or had anything to drink today. She currently reports feeling tired and cold.  ?BP-102/72, received 500cc NaCl and it increased to 134/82. HR-65, CBG-118. ?20g RAC. 12 lead unremarkable.  ?

## 2021-11-17 ENCOUNTER — Encounter: Payer: Self-pay | Admitting: Emergency Medicine

## 2021-11-17 ENCOUNTER — Encounter: Payer: Self-pay | Admitting: Internal Medicine

## 2021-11-17 ENCOUNTER — Ambulatory Visit (INDEPENDENT_AMBULATORY_CARE_PROVIDER_SITE_OTHER): Payer: 59 | Admitting: Internal Medicine

## 2021-11-17 VITALS — BP 116/80 | HR 84 | Temp 98.0°F | Ht 64.0 in | Wt 100.2 lb

## 2021-11-17 DIAGNOSIS — R350 Frequency of micturition: Secondary | ICD-10-CM | POA: Diagnosis not present

## 2021-11-17 DIAGNOSIS — R519 Headache, unspecified: Secondary | ICD-10-CM | POA: Diagnosis not present

## 2021-11-17 DIAGNOSIS — R55 Syncope and collapse: Secondary | ICD-10-CM | POA: Insufficient documentation

## 2021-11-17 LAB — URINALYSIS, ROUTINE W REFLEX MICROSCOPIC
Bilirubin Urine: NEGATIVE
Hgb urine dipstick: NEGATIVE
Leukocytes,Ua: NEGATIVE
Nitrite: NEGATIVE
RBC / HPF: NONE SEEN (ref 0–?)
Specific Gravity, Urine: 1.02 (ref 1.000–1.030)
Total Protein, Urine: NEGATIVE
Urine Glucose: NEGATIVE
Urobilinogen, UA: 0.2 (ref 0.0–1.0)
pH: 6 (ref 5.0–8.0)

## 2021-11-17 NOTE — Assessment & Plan Note (Signed)
Acute ?Occurred yesterday ?Possible seizure - ? Related to dehydration vs other ?CT head ?Refer to neuro ?Decrease caffeine, increase water/other fluids, no moe BC powder, needs to eat regularly ?

## 2021-11-17 NOTE — Assessment & Plan Note (Signed)
Has daily headaches and takes BC powder daily ?Drinks coffee and soda all day - no water ?Does not eat much most of the time ?Headaches may be rebound, related to dehydration, not eating ?Advised her she needs to decrease caffeine intake slowly - ideally get down to drinking one cup of coffee - do this slowly ?Cut out soda, drink water, Gatorade, flavored water ?No BC powder - discussed possible rebound headaches ?CT head ?Refer to neuro ?
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
## 2021-11-17 NOTE — Assessment & Plan Note (Addendum)
ua from yesterday had trace leuk, but no other indication of infection ?She is concerned about her history of UTIs ?Recheck UA, get U cx ?Increase fluids - non-caffeine drinks ? ?

## 2021-11-17 NOTE — Telephone Encounter (Signed)
Dr. Jonny Ruiz please disregard previous message. Patient needs to be seen in office will discuss labs at OV. Patient was seen in ER but left AMA.  ?

## 2021-11-17 NOTE — Patient Instructions (Addendum)
? ?  urine tests were ordered.  Give this downstairs.  ? ? ?A Ct scan of you head was ordered.  Someone will call you to schedule this.  ? ? ?A referral was ordered for neurology.     Someone from that office will call you to schedule an appointment.  ? ? ? ?Decrease your caffeine slowly - get to only 1 cup of coffee a day ? ?No more soda. ? ?No more BC powder ? ?You need to increase your water/non-caffeine fluids intake ? ?You need to be eating regularly ?

## 2021-11-18 ENCOUNTER — Encounter: Payer: Self-pay | Admitting: Internal Medicine

## 2021-11-18 ENCOUNTER — Ambulatory Visit: Payer: 59 | Admitting: Family Medicine

## 2021-11-18 LAB — URINE CULTURE

## 2021-11-19 ENCOUNTER — Encounter: Payer: Self-pay | Admitting: Internal Medicine

## 2021-11-22 ENCOUNTER — Encounter: Payer: Self-pay | Admitting: Emergency Medicine

## 2021-11-22 NOTE — Telephone Encounter (Signed)
Not sure what is going on with this patient.  I have not seen her in a while.  Recommend office visit.  Thanks.

## 2021-11-24 NOTE — Addendum Note (Signed)
Addended by: Karma Ganja on: 11/24/2021 04:32 PM ? ? Modules accepted: Orders ? ?

## 2021-11-29 ENCOUNTER — Ambulatory Visit (INDEPENDENT_AMBULATORY_CARE_PROVIDER_SITE_OTHER): Payer: 59 | Admitting: Neurology

## 2021-11-29 ENCOUNTER — Ambulatory Visit: Payer: 59 | Admitting: Emergency Medicine

## 2021-11-29 ENCOUNTER — Encounter: Payer: Self-pay | Admitting: Neurology

## 2021-11-29 VITALS — BP 128/92 | HR 79 | Ht 64.0 in | Wt 103.0 lb

## 2021-11-29 DIAGNOSIS — R569 Unspecified convulsions: Secondary | ICD-10-CM

## 2021-11-29 DIAGNOSIS — R55 Syncope and collapse: Secondary | ICD-10-CM | POA: Diagnosis not present

## 2021-11-29 DIAGNOSIS — G44229 Chronic tension-type headache, not intractable: Secondary | ICD-10-CM | POA: Diagnosis not present

## 2021-11-29 MED ORDER — PROPRANOLOL HCL 10 MG PO TABS: 10.0000 mg | ORAL_TABLET | Freq: Two times a day (BID) | ORAL | 6 refills | Status: DC

## 2021-11-29 MED ORDER — PROPRANOLOL HCL 20 MG PO TABS: 20.0000 mg | ORAL_TABLET | Freq: Two times a day (BID) | ORAL | 6 refills | Status: DC

## 2021-11-29 NOTE — Patient Instructions (Addendum)
Routine EEG  ?Start propanolol 10 mg twice daily for headaches prevention  ?Can take BC powder as needed for severe headaches but no more than 3 times per week.  ?Return in 6 months  ?

## 2021-11-29 NOTE — Progress Notes (Addendum)
? ?[facility] ? ?PATIENT: Sarah Fritz ?DOB: 02-23-1981 ? ?REQUESTING CLINICIAN: Pincus Sanes, MD ?HISTORY FROM: Patient  ?REASON FOR VISIT: Syncope vs seizure.  ? ? ?HISTORICAL ? ?CHIEF COMPLAINT:  ?Chief Complaint  ?Patient presents with  ? New Patient (Initial Visit)  ?  Rm 12. Alone. ?NP Internal referral for Syncope, ?  Seizure, daily headaches. ?CT scan scheduled for 12/07/2021.  ? ? ?HISTORY OF PRESENT ILLNESS:  ?This is a 41 year old woman past medical history of Graves' disease treated with radiation with subsequent hypothyroidism, who is presenting after seizure versus syncope episode.  ?Patient reported being out talking to her neighbor on 11/16/21 and suddenly started feeling dizzy, she was walking to her house when she collapsed.  Patient reported that her fianc? caught her, her eye looked up her whole body tensed up and she was snoring loud.  Fianc? put her on the couch where she had bowel and bladder incontinence.  Entire episode lasted less than a minute.  Patient reports that she did not eat or drink anything in the morning of the event occurred around 4:30- 5 PM.  She denies confusion after the event.  She was taken to the emergency department but she has to leave earlier.  She did follow-up with her primary care doctor the next day who referred her to neurology.  Patient reports 15 years ago she had 1 syncopal episode but otherwise does not suffer from recurrent syncope.  She denies any history of seizure, no family history of seizure.   ? ?Patient also complains about daily headaches, she reports a long history of daily headaches for which she takes [employer] powder every day, and she has been doing this for many years.  She did follow-up with her primary care doctor who advised against BC powder.  She still having daily headache.  Headache do not have any migrainous feature.  She reported sometimes in the back of the head and sometimes frontally.  Denies any nausea or  vomiting associated with a headache. ? ? ?OTHER MEDICAL CONDITIONS: Hypothyroidism ? ? ?REVIEW OF SYSTEMS: Full 14 system review of systems performed and negative with exception of: as noted in the HPI.,  ? ?ALLERGIES: ?Allergies  ?Allergen Reactions  ? Percocet [Oxycodone-Acetaminophen] Nausea And Vomiting and Other (See Comments)  ?  Sweating, pass out  ? Vicodin [Hydrocodone-Acetaminophen] Nausea And Vomiting and Other (See Comments)  ?  Sweating, pass out  ? ? ?HOME MEDICATIONS: ?Outpatient Medications Prior to Visit  ?Medication Sig Dispense Refill  ? levothyroxine (SYNTHROID) 50 MCG tablet Take 50 mcg by mouth daily before breakfast.    ? ?No facility-administered medications prior to visit.  ? ? ?PAST MEDICAL HISTORY: ?Past Medical History:  ?Diagnosis Date  ? Abscess   ? Anemia   ? Chlamydia   ? Depression   ? Gonorrhea   ? Graves disease   ? Hypertension   ? ? ?PAST SURGICAL HISTORY: ?Past Surgical History:  ?Procedure Laterality Date  ? WISDOM TOOTH EXTRACTION    ? ? ?FAMILY HISTORY: ?Family History  ?Problem Relation Age of Onset  ? Heart disease Mother   ? Kidney disease Mother   ? COPD Mother   ? Cancer Mother   ? Cancer Father   ? COPD Father   ? ? ?SOCIAL HISTORY: ?Social History  ? ?Socioeconomic History  ? Marital status: Single  ?  Spouse name: Not on file  ? Number of children: Not on file  ? Years of education:  Not on file  ? Highest education level: Not on file  ?Occupational History  ? Not on file  ?Tobacco Use  ? Smoking status: Every Day  ?  Packs/day: 0.50  ?  Types: Cigarettes  ? Smokeless tobacco: Current  ?Vaping Use  ? Vaping Use: Never used  ?Substance and Sexual Activity  ? Alcohol use: No  ?  Comment: occasional  ? Drug use: Yes  ?  Types: Marijuana  ?  Comment: Last marijuana 09/01/15  ? Sexual activity: Yes  ?  Birth control/protection: None  ?Other Topics Concern  ? Not on file  ?Social History Narrative  ? Not on file  ? ?Social Determinants of Health  ? ?Financial Resource Strain:  Not on file  ?Food Insecurity: Not on file  ?Transportation Needs: Not on file  ?Physical Activity: Not on file  ?Stress: Not on file  ?Social Connections: Not on file  ?Intimate Partner Violence: Not on file  ? ? ?PHYSICAL EXAM ? ?GENERAL EXAM/CONSTITUTIONAL: ?Vitals:  ?Vitals:  ? 11/29/21 0804  ?BP: (!) 128/92  ?Pulse: 79  ?Weight: 103 lb (46.7 kg)  ?Height: 5\' 4"  (1.626 m)  ? ?Body mass index is 17.68 kg/m?. ?Wt Readings from Last 3 Encounters:  ?11/29/21 103 lb (46.7 kg)  ?11/17/21 100 lb 3.2 oz (45.5 kg)  ?02/18/21 97 lb (44 kg)  ? ?Patient is in no distress; well developed, nourished and groomed; neck is supple ? ?EYES: ?Pupils round and reactive to light, Visual fields full to confrontation, Extraocular movements intacts,  ? ?MUSCULOSKELETAL: ?Gait, strength, tone, movements noted in Neurologic exam below ? ?NEUROLOGIC: ?MENTAL STATUS:  ?   ? View : No data to display.  ?  ?  ?  ? ?awake, alert, oriented to person, place and time ?recent and remote memory intact ?normal attention and concentration ?language fluent, comprehension intact, naming intact ?fund of knowledge appropriate ? ?CRANIAL NERVE:  ?2nd, 3rd, 4th, 6th - pupils equal and reactive to light, visual fields full to confrontation, extraocular muscles intact, no nystagmus ?5th - facial sensation symmetric ?7th - facial strength symmetric ?8th - hearing intact ?9th - palate elevates symmetrically, uvula midline ?11th - shoulder shrug symmetric ?12th - tongue protrusion midline ? ?MOTOR:  ?normal bulk and tone, full strength in the BUE, BLE ? ?SENSORY:  ?normal and symmetric to light touch, pinprick, temperature, vibration ? ?COORDINATION:  ?finger-nose-finger, fine finger movements normal ? ?REFLEXES:  ?deep tendon reflexes present and symmetric ? ?GAIT/STATION:  ?normal ? ? ?DIAGNOSTIC DATA (LABS, IMAGING, TESTING) ?- I reviewed patient records, labs, notes, testing and imaging myself where available. ? ?Lab Results  ?Component Value Date  ? WBC  10.0 11/16/2021  ? HGB 11.7 (L) 11/16/2021  ? HCT 35.3 (L) 11/16/2021  ? MCV 75.1 (L) 11/16/2021  ? PLT 236 11/16/2021  ? ?   ?Component Value Date/Time  ? NA 138 11/16/2021 2053  ? NA 139 11/23/2016 1609  ? K 4.2 11/16/2021 2053  ? CL 109 11/16/2021 2053  ? CO2 23 11/16/2021 2053  ? GLUCOSE 101 (H) 11/16/2021 2053  ? BUN 7 11/16/2021 2053  ? BUN 8 11/23/2016 1609  ? CREATININE 0.75 11/16/2021 2053  ? CALCIUM 9.3 11/16/2021 2053  ? PROT 6.5 06/20/2017 1249  ? PROT 6.4 11/23/2016 1609  ? ALBUMIN 3.2 (L) 06/20/2017 1249  ? ALBUMIN 4.0 11/23/2016 1609  ? AST 27 06/20/2017 1249  ? ALT 29 06/20/2017 1249  ? ALKPHOS 74 06/20/2017 1249  ? BILITOT 0.9 06/20/2017  1249  ? BILITOT 0.5 11/23/2016 1609  ? GFRNONAA >60 11/16/2021 2053  ? GFRAA >60 11/02/2017 0756  ? ?Lab Results  ?Component Value Date  ? CHOL 125 07/14/2016  ? HDL 34 (L) 07/14/2016  ? LDLCALC 38 07/14/2016  ? TRIG 267 (H) 07/14/2016  ? CHOLHDL 3.7 07/14/2016  ? ?No results found for: HGBA1C ?No results found for: VITAMINB12 ?Lab Results  ?Component Value Date  ? TSH 84.651 (H) 11/02/2017  ? ? ? ?ASSESSMENT AND PLAN ? ?41 y.o. year old female with history of of Graves' disease and hypothyroidism who is presenting for seizure versus syncope.  Patient reports that she did not eat or drink anything the entire day in the early evening she collapsed, she was noted to tensed up, eyes rolled back and made a snoring/grunting noise.  She did also have bowel and bladder incontinence. I will start by obtaining a routine EEG and if abnormal will obtain a brain MRI.  If the EEG is normal we will just continue to monitor patient as patient episode might be secondary to vasovagal syncope. ?For her daily headaches, it sounds like tension type headache.  I will start with propranolol 10 mg twice daily and will continue to monitor.  I also informed patient that she can use BC powder but no more than 3 days a week since it has been helpful for her.  I will see her in 6 months for  follow-up ? ? ? ?1. Syncope, unspecified syncope type   ?2. Seizure-like activity (HCC)   ?3. Chronic tension-type headache, not intractable   ? ? ? ?Patient Instructions  ?Routine EEG  ?Start propanolol 10 mg

## 2021-12-02 ENCOUNTER — Ambulatory Visit (INDEPENDENT_AMBULATORY_CARE_PROVIDER_SITE_OTHER): Payer: 59 | Admitting: Neurology

## 2021-12-02 DIAGNOSIS — R55 Syncope and collapse: Secondary | ICD-10-CM

## 2021-12-02 DIAGNOSIS — R569 Unspecified convulsions: Secondary | ICD-10-CM

## 2021-12-02 NOTE — Procedures (Signed)
? ? ?  History: ? ?41 year old man with syncope  ? ?EEG classification: Awake and drowsy ? ?Description of the recording: The background rhythms of this recording consists of a fairly well modulated medium amplitude alpha rhythm of 10 Hz that is reactive to eye opening and closure. As the record progresses, the patient appears to remain in the waking state throughout the recording. Photic stimulation was performed, did not show any abnormalities. Hyperventilation was also performed, did not show any abnormalities. Toward the end of the recording, the patient enters the drowsy state with slight symmetric slowing seen. The patient never enters stage II sleep. No abnormal epileptiform discharges seen during this recording. There was no focal slowing. EKG monitor shows no evidence of cardiac rhythm abnormalities with a heart rate of 60. ? ?Abnormality: None  ? ?Impression: This is a normal EEG recording in the waking and drowsy state. No evidence of interictal epileptiform discharges seen. A normal EEG does not exclude a diagnosis of epilepsy.  ? ? ?Alric Ran, MD ?[facility] ? ? ?

## 2021-12-05 ENCOUNTER — Encounter: Payer: Self-pay | Admitting: Internal Medicine

## 2021-12-06 NOTE — Telephone Encounter (Signed)
Noted.faxing over FMLA to [phone]../lm,b ?

## 2021-12-07 ENCOUNTER — Ambulatory Visit
Admission: RE | Admit: 2021-12-07 | Discharge: 2021-12-07 | Disposition: A | Discharge status: Home / Self Care | Payer: 59 | Source: Ambulatory Visit | Attending: Internal Medicine | Admitting: Internal Medicine

## 2021-12-07 DIAGNOSIS — R55 Syncope and collapse: Secondary | ICD-10-CM

## 2021-12-09 DIAGNOSIS — Z0279 Encounter for issue of other medical certificate: Secondary | ICD-10-CM

## 2022-01-14 ENCOUNTER — Encounter: Payer: Self-pay | Admitting: Neurology

## 2022-03-01 ENCOUNTER — Encounter: Payer: Self-pay | Admitting: Emergency Medicine

## 2022-03-01 ENCOUNTER — Ambulatory Visit (INDEPENDENT_AMBULATORY_CARE_PROVIDER_SITE_OTHER): Payer: 59 | Admitting: Emergency Medicine

## 2022-03-01 VITALS — BP 164/102 | HR 77 | Temp 98.4°F | Ht 64.0 in | Wt 102.4 lb

## 2022-03-01 DIAGNOSIS — Z13220 Encounter for screening for lipoid disorders: Secondary | ICD-10-CM

## 2022-03-01 DIAGNOSIS — Z130 Encounter for screening for diseases of the blood and blood-forming organs and certain disorders involving the immune mechanism: Secondary | ICD-10-CM

## 2022-03-01 DIAGNOSIS — Z1159 Encounter for screening for other viral diseases: Secondary | ICD-10-CM

## 2022-03-01 DIAGNOSIS — I10 Essential (primary) hypertension: Secondary | ICD-10-CM | POA: Diagnosis not present

## 2022-03-01 DIAGNOSIS — Z13228 Encounter for screening for other metabolic disorders: Secondary | ICD-10-CM

## 2022-03-01 DIAGNOSIS — F17200 Nicotine dependence, unspecified, uncomplicated: Secondary | ICD-10-CM

## 2022-03-01 DIAGNOSIS — E890 Postprocedural hypothyroidism: Secondary | ICD-10-CM

## 2022-03-01 DIAGNOSIS — E0500 Thyrotoxicosis with diffuse goiter without thyrotoxic crisis or storm: Secondary | ICD-10-CM

## 2022-03-01 DIAGNOSIS — Z1329 Encounter for screening for other suspected endocrine disorder: Secondary | ICD-10-CM | POA: Diagnosis not present

## 2022-03-01 DIAGNOSIS — Z0001 Encounter for general adult medical examination with abnormal findings: Secondary | ICD-10-CM

## 2022-03-01 LAB — CBC WITH DIFFERENTIAL/PLATELET
Basophils Absolute: 0 10*3/uL (ref 0.0–0.1)
Basophils Relative: 0.4 % (ref 0.0–3.0)
Eosinophils Absolute: 0.3 10*3/uL (ref 0.0–0.7)
Eosinophils Relative: 4.6 % (ref 0.0–5.0)
HCT: 35 % — ABNORMAL LOW (ref 36.0–46.0)
Hemoglobin: 11.3 g/dL — ABNORMAL LOW (ref 12.0–15.0)
Lymphocytes Relative: 23.5 % (ref 12.0–46.0)
Lymphs Abs: 1.4 10*3/uL (ref 0.7–4.0)
MCHC: 32.4 g/dL (ref 30.0–36.0)
MCV: 76 fl — ABNORMAL LOW (ref 78.0–100.0)
Monocytes Absolute: 0.6 10*3/uL (ref 0.1–1.0)
Monocytes Relative: 10.9 % (ref 3.0–12.0)
Neutro Abs: 3.6 10*3/uL (ref 1.4–7.7)
Neutrophils Relative %: 60.6 % (ref 43.0–77.0)
Platelets: 226 10*3/uL (ref 150.0–400.0)
RBC: 4.61 Mil/uL (ref 3.87–5.11)
RDW: 15.4 % (ref 11.5–15.5)
WBC: 5.9 10*3/uL (ref 4.0–10.5)

## 2022-03-01 LAB — LIPID PANEL
Cholesterol: 162 mg/dL (ref 0–200)
HDL: 40 mg/dL (ref >39.00)
LDL Cholesterol: 85 mg/dL (ref 0–99)
NonHDL: 121.53
Total CHOL/HDL Ratio: 4
Triglycerides: 182 mg/dL — ABNORMAL HIGH (ref 0.0–149.0)
VLDL: 36.4 mg/dL (ref 0.0–40.0)

## 2022-03-01 LAB — COMPREHENSIVE METABOLIC PANEL
ALT: 20 U/L (ref 0–35)
AST: 19 U/L (ref 0–37)
Albumin: 4.3 g/dL (ref 3.5–5.2)
Alkaline Phosphatase: 45 U/L (ref 39–117)
BUN: 8 mg/dL (ref 6–23)
CO2: 28 mEq/L (ref 19–32)
Calcium: 9.1 mg/dL (ref 8.4–10.5)
Chloride: 104 mEq/L (ref 96–112)
Creatinine, Ser: 0.78 mg/dL (ref 0.40–1.20)
GFR: 94.18 mL/min (ref >60.00)
Glucose, Bld: 87 mg/dL (ref 70–99)
Potassium: 4 mEq/L (ref 3.5–5.1)
Sodium: 138 mEq/L (ref 135–145)
Total Bilirubin: 0.5 mg/dL (ref 0.2–1.2)
Total Protein: 7 g/dL (ref 6.0–8.3)

## 2022-03-01 LAB — HEMOGLOBIN A1C: Hgb A1c MFr Bld: 5.5 % (ref 4.6–6.5)

## 2022-03-01 LAB — TSH: TSH: 4.17 u[IU]/mL (ref 0.35–5.50)

## 2022-03-01 MED ORDER — BUPROPION HCL ER (XL) 150 MG PO TB24: 150.0000 mg | ORAL_TABLET | Freq: Every day | ORAL | 1 refills | Status: DC

## 2022-03-01 MED ORDER — VALSARTAN-HYDROCHLOROTHIAZIDE 80-12.5 MG PO TABS: 1.0000 | ORAL_TABLET | Freq: Every day | ORAL | 3 refills | Status: DC

## 2022-03-01 NOTE — Assessment & Plan Note (Signed)
Contributing to symptoms. We will start valsartan-HCTZ 80-12.5 mg daily. Cardiovascular risks associated with uncontrolled hypertension discussed. Dietary approaches to stop hypertension discussed. Advised to monitor blood pressure readings at home daily for the next several weeks and keep a log.  Advised to contact the office if numbers persistently abnormal.

## 2022-03-01 NOTE — Assessment & Plan Note (Signed)
Clinically euthyroid. Continue Synthroid 50 mcg daily. 

## 2022-03-01 NOTE — Progress Notes (Signed)
Sarah Fritz 41 y.o.   Chief Complaint  Patient presents with   Annual Exam   Dizziness   Migraine   Diabetes    HISTORY OF PRESENT ILLNESS: This is a 41 y.o. female here for annual exam. Has history of migraine headaches.  On propranolol 10 mg twice a day for prophylaxis but not working very well.  Sees neurologist on a regular basis History of hypertension with elevated blood pressure readings at home and occasional dizziness.  Not taking any other medication. No other complaints or medical concerns today Still smoking.  HPI   Prior to Admission medications   Medication Sig Start Date End Date Taking? Authorizing Provider  levothyroxine (SYNTHROID) 50 MCG tablet Take 50 mcg by mouth daily before breakfast.   Yes [provider]  propranolol (INDERAL) 10 MG tablet Take 1 tablet (10 mg total) by mouth 2 (two) times daily. 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   Graves disease 07/27/2016   Hyperthyroidism 07/14/2016   Chronic pain syndrome 07/14/2016   Smoking 07/14/2016   Recurrent UTI 04/18/2013    Past Medical History:  Diagnosis Date   Abscess    Anemia    Chlamydia    Depression    Gonorrhea    Graves disease    Hypertension     Past Surgical History:  Procedure Laterality Date   WISDOM TOOTH EXTRACTION      Social History   Socioeconomic History   Marital status: Single    Spouse name: Not on file   Number of children: Not on file   Years of education: Not on file   Highest education level: Not on file   Occupational History   Not on file  Tobacco Use   Smoking status: Every Day    Packs/day: 0.50    Types: Cigarettes   Smokeless tobacco: Current  Vaping Use   Vaping Use: Never used  Substance and Sexual Activity   Alcohol use: No    Comment: occasional   Drug use: Yes    Types: Marijuana    Comment: Last marijuana 09/01/15   Sexual activity: Yes    Birth control/protection: None  Other Topics Concern   Not on file  Social History Narrative   Not on file   Social Determinants of Health   Financial Resource Strain: Not on file  Food Insecurity: Not on file  Transportation Needs: Not on file  Physical Activity: Not on file  Stress: Not on file  Social Connections: Not on file  Intimate Partner Violence: Not on file    Family History  Problem Relation Age of Onset   Heart disease Mother    Kidney disease Mother    COPD Mother    Cancer Mother    Cancer Father    COPD Father      Review of Systems  Constitutional: Negative.  Negative for chills and fever.  HENT: Negative.  Negative for congestion and sore throat.   Respiratory: Negative.  Negative for cough and shortness of breath.   Cardiovascular:  Negative for  chest pain and palpitations.  Gastrointestinal:  Negative for abdominal pain, diarrhea, nausea and vomiting.  Genitourinary: Negative.   Skin: Negative.  Negative for rash.  Neurological:  Positive for dizziness and headaches.  All other systems reviewed and are negative.  Today's Vitals   03/01/22 0954 03/01/22 0959  BP: (!) 160/100 (!) 164/102  Pulse: 77   Temp: 98.4 F (36.9 C)   TempSrc: Oral   Weight: 102 lb 6 oz (46.4 kg)   Height: 5\' 4"  (1.626 m)    Body mass index is 17.57 kg/m.   Physical Exam Vitals reviewed.  Constitutional:      Appearance: Normal appearance.  HENT:     Head: Normocephalic.     Right Ear: Tympanic membrane, ear canal and external ear normal.     Left Ear: Tympanic membrane, ear canal and external ear  normal.     Mouth/Throat:     Mouth: Mucous membranes are moist.     Pharynx: Oropharynx is clear.  Eyes:     Extraocular Movements: Extraocular movements intact.     Conjunctiva/sclera: Conjunctivae normal.     Pupils: Pupils are equal, round, and reactive to light.  Cardiovascular:     Rate and Rhythm: Normal rate and regular rhythm.     Pulses: Normal pulses.     Heart sounds: Normal heart sounds.  Pulmonary:     Effort: Pulmonary effort is normal.     Breath sounds: Normal breath sounds.  Abdominal:     General: There is no distension.     Palpations: Abdomen is soft.     Tenderness: There is no abdominal tenderness.  Musculoskeletal:        General: Normal range of motion.     Cervical back: No tenderness.     Right lower leg: No edema.     Left lower leg: No edema.  Lymphadenopathy:     Cervical: No cervical adenopathy.  Skin:    General: Skin is warm and dry.     Capillary Refill: Capillary refill takes less than 2 seconds.  Neurological:     General: No focal deficit present.     Mental Status: She is alert and oriented to person, place, and time.  Psychiatric:        Mood and Affect: Mood normal.        Behavior: Behavior normal.      ASSESSMENT & PLAN: Problem List Items Addressed This Visit       Cardiovascular and Mediastinum   Uncontrolled hypertension    Contributing to symptoms. We will start valsartan-HCTZ 80-12.5 mg daily. Cardiovascular risks associated with uncontrolled hypertension discussed. Dietary approaches to stop hypertension discussed. Advised to monitor blood pressure readings at home daily for the next several weeks and keep a log.  Advised to contact the office if numbers persistently abnormal.      Relevant Medications   valsartan-hydrochlorothiazide (DIOVAN-HCT) 80-12.5 MG tablet     Endocrine   Graves disease   Relevant Orders   TSH   Postablative hypothyroidism    Clinically euthyroid.  Continue Synthroid 50 mcg daily.         Other   Smoking    Cancer and cardiovascular risk associated with smoking discussed. Patient willing to try medication. We will start Wellbutrin 150 mg daily. Smoking cessation advice given.      Relevant Medications   buPROPion (WELLBUTRIN XL) 150 MG 24 hr tablet   Other Visit Diagnoses     Encounter for general  adult medical examination with abnormal findings    -  Primary   Essential hypertension       Relevant Medications   valsartan-hydrochlorothiazide (DIOVAN-HCT) 80-12.5 MG tablet   Other Relevant Orders   Comprehensive metabolic panel   Need for hepatitis C screening test       Relevant Orders   Hepatitis C antibody screen   Screening for deficiency anemia       Relevant Orders   CBC with Differential   Screening for lipoid disorders       Relevant Orders   Lipid panel   Screening for endocrine, metabolic and immunity disorder       Relevant Orders   Comprehensive metabolic panel   Hemoglobin A1c      Modifiable risk factors discussed with patient. Anticipatory guidance according to age provided. The following topics were also discussed: Social Determinants of Health Smoking.  Cancer and cardiovascular risks associated with smoking discussed.  Smoking cessation advice given.  Started on Wellbutrin 150 mg daily. Diet and nutrition.  Dietary approaches to stop hypertension discussed. Benefits of exercise Cancer family history review Vaccinations recommendations Cardiovascular risk assessment Diagnosis of hypertension and management.  Need to start medication, valsartan-HCTZ 80-12.5 mg daily.  Cardiovascular risks associated with uncontrolled hypertension discussed. Mental health including depression and anxiety Fall and accident prevention  Patient Instructions  Health Maintenance, Female Adopting a healthy lifestyle and getting preventive care are important in promoting health and wellness. Ask your health care provider about: The right schedule for  you to have regular tests and exams. Things you can do on your own to prevent diseases and keep yourself healthy. What should I know about diet, weight, and exercise? Eat a healthy diet  Eat a diet that includes plenty of vegetables, fruits, low-fat dairy products, and lean protein. Do not eat a lot of foods that are high in solid fats, added sugars, or sodium. Maintain a healthy weight Body mass index (BMI) is used to identify weight problems. It estimates body fat based on height and weight. Your health care provider can help determine your BMI and help you achieve or maintain a healthy weight. Get regular exercise Get regular exercise. This is one of the most important things you can do for your health. Most adults should: Exercise for at least 150 minutes each week. The exercise should increase your heart rate and make you sweat (moderate-intensity exercise). Do strengthening exercises at least twice a week. This is in addition to the moderate-intensity exercise. Spend less time sitting. Even light physical activity can be beneficial. Watch cholesterol and blood lipids Have your blood tested for lipids and cholesterol at 42 years of age, then have this test every 5 years. Have your cholesterol levels checked more often if: Your lipid or cholesterol levels are high. You are older than 42 years of age. You are at high risk for heart disease. What should I know about cancer screening? Depending on your health history and family history, you may need to have cancer screening at various ages. This may include screening for: Breast cancer. Cervical cancer. Colorectal cancer. Skin cancer. Lung cancer. What should I know about heart disease, diabetes, and high blood pressure? Blood pressure and heart disease High blood pressure causes heart disease and increases the risk of stroke. This is more likely to develop in people who have high blood pressure readings or are overweight. Have your  blood pressure checked: Every 3-5 years if you are 33-64 years of age. Every  year if you are 228 years old or older. Diabetes Have regular diabetes screenings. This checks your fasting blood sugar level. Have the screening done: Once every three years after age 42 if you are at a normal weight and have a low risk for diabetes. More often and at a younger age if you are overweight or have a high risk for diabetes. What should I know about preventing infection? Hepatitis B If you have a higher risk for hepatitis B, you should be screened for this virus. Talk with your health care provider to find out if you are at risk for hepatitis B infection. Hepatitis C Testing is recommended for: Everyone born from [id number] through 1965. Anyone with known risk factors for hepatitis C. Sexually transmitted infections (STIs) Get screened for STIs, including gonorrhea and chlamydia, if: You are sexually active and are younger than 42 years of age. You are older than 42 years of age and your health care provider tells you that you are at risk for this type of infection. Your sexual activity has changed since you were last screened, and you are at increased risk for chlamydia or gonorrhea. Ask your health care provider if you are at risk. Ask your health care provider about whether you are at high risk for HIV. Your health care provider may recommend a prescription medicine to help prevent HIV infection. If you choose to take medicine to prevent HIV, you should first get tested for HIV. You should then be tested every 3 months for as long as you are taking the medicine. Pregnancy If you are about to stop having your period (premenopausal) and you may become pregnant, seek counseling before you get pregnant. Take 400 to 800 micrograms (mcg) of folic acid every day if you become pregnant. Ask for birth control (contraception) if you want to prevent pregnancy. Osteoporosis and menopause Osteoporosis is a disease in  which the bones lose minerals and strength with aging. This can result in bone fractures. If you are 42 years old or older, or if you are at risk for osteoporosis and fractures, ask your health care provider if you should: Be screened for bone loss. Take a calcium or vitamin D supplement to lower your risk of fractures. Be given hormone replacement therapy (HRT) to treat symptoms of menopause. Follow these instructions at home: Alcohol use Do not drink alcohol if: Your health care provider tells you not to drink. You are pregnant, may be pregnant, or are planning to become pregnant. If you drink alcohol: Limit how much you have to: 0-1 drink a day. Know how much alcohol is in your drink. In the [place] one drink equals one 12 oz bottle of beer (355 mL), one 5 oz glass of wine (148 mL), or one 1 oz glass of hard liquor (44 mL). Lifestyle Do not use any products that contain nicotine or tobacco. These products include cigarettes, chewing tobacco, and vaping devices, such as e-cigarettes. If you need help quitting, ask your health care provider. Do not use street drugs. Do not share needles. Ask your health care provider for help if you need support or information about quitting drugs. General instructions Schedule regular health, dental, and eye exams. Stay current with your vaccines. Tell your health care provider if: You often feel depressed. You have ever been abused or do not feel safe at home. Summary Adopting a healthy lifestyle and getting preventive care are important in promoting health and wellness. Follow your health care provider's instructions about healthy  diet, exercising, and getting tested or screened for diseases. Follow your health care provider's instructions on monitoring your cholesterol and blood pressure. This information is not intended to replace advice given to you by your health care provider. Make sure you discuss any questions you have with your health care  provider. Document Revised: 12/21/2020 Document Reviewed: 12/21/2020 Elsevier Patient Education  2023 Elsevier Inc.     Edwina Barth, MD [facility]

## 2022-03-01 NOTE — Assessment & Plan Note (Signed)
Cancer and cardiovascular risk associated with smoking discussed. Patient willing to try medication. We will start Wellbutrin 150 mg daily. Smoking cessation advice given.

## 2022-03-01 NOTE — Patient Instructions (Signed)

## 2022-03-02 LAB — HEPATITIS C ANTIBODY: Hepatitis C Ab: NONREACTIVE

## 2022-03-04 ENCOUNTER — Encounter: Payer: Self-pay | Admitting: Emergency Medicine

## 2022-03-04 NOTE — Telephone Encounter (Signed)
Needs OV next able

## 2022-03-07 ENCOUNTER — Encounter: Payer: Self-pay | Admitting: Internal Medicine

## 2022-03-07 ENCOUNTER — Ambulatory Visit (INDEPENDENT_AMBULATORY_CARE_PROVIDER_SITE_OTHER): Payer: 59 | Admitting: Internal Medicine

## 2022-03-07 VITALS — BP 110/78 | HR 115 | Ht 64.0 in | Wt 98.0 lb

## 2022-03-07 DIAGNOSIS — R55 Syncope and collapse: Secondary | ICD-10-CM | POA: Diagnosis not present

## 2022-03-07 DIAGNOSIS — F17200 Nicotine dependence, unspecified, uncomplicated: Secondary | ICD-10-CM

## 2022-03-07 DIAGNOSIS — K5900 Constipation, unspecified: Secondary | ICD-10-CM | POA: Diagnosis not present

## 2022-03-07 DIAGNOSIS — N924 Excessive bleeding in the premenopausal period: Secondary | ICD-10-CM | POA: Diagnosis not present

## 2022-03-07 DIAGNOSIS — D649 Anemia, unspecified: Secondary | ICD-10-CM

## 2022-03-07 DIAGNOSIS — N920 Excessive and frequent menstruation with regular cycle: Secondary | ICD-10-CM | POA: Insufficient documentation

## 2022-03-07 NOTE — Patient Instructions (Addendum)
Please take miralax OTC 17 gm per day to avoid further constipation  Please take an OTC Multivitamin with iron every day  Please stop smoking; ok to stay off the Wellbutrin for now  Please continue all other medications as before, and refills have been done if requested.  Please have the pharmacy call with any other refills you may need.  Please keep your appointments with your specialists as you may have planned  You will be contacted regarding the referral for: GYN

## 2022-03-07 NOTE — Progress Notes (Signed)
Patient ID: Sarah Fritz, female   DOB: 1980-12-20, 41 y.o.   MRN: [id number]        Chief Complaint: follow up recent recurrent syncope       HPI:  Sarah Fritz is a 41 y.o. female here with c/o LOC again early hours about 1 am on Fri July 21. Pt has hx of chronic smoking but only took one pill of the wellbutrin xl 150 per PCP last wk as she read the side effect that mentioned possible siezure. Also hx of Graves dz with severe low thyroid mar 2023 but TSH found normal with good med compliance July 18.  Pt also with hx of syncope first episode April 2023, seen in ED with head CT neg, lab with mild anemia 11.3, and f/u with Neurology with normal EEG.  Then did relatively well until went to bed about 1130 pm on July 20, but awakened about 1 am July 21 with feeling of need to urinate and defecate; tried to sit up but felt too weak to get OOB, lay down and apparently made some noise as the boyfriend lying next to her heard something that woke him, and observed her lying on her back, arms sort of drawn up, eye seemed to be looking but not speaking for a minute or two, then became more herself just after with being able to communicate but still weak and had immediate vomiting and fecal incontinence.  Son with her today observed this as well, called EMS, apparently had normal VS and pt declined transport to ED this time as Ed eval prior was lengthy process.  Has had some increased constipation recently despite normal TSH, appetite ok for her but has lost a few lbs for unclear reason.  Trying to quit smoking, but unable so far.  Peak wt has been only 105 in the past.  Does not have regular GYN, did have mild anemia with recent labs, and though improved over baseline in the 9 range, has had also heavy menses though only lasting about 3 days this year.         Wt Readings from Last 3 Encounters:  03/07/22 98 lb (44.5 kg)  03/01/22 102 lb 6 oz (46.4 kg)  11/29/21 103 lb (46.7 kg)   BP Readings from  Last 3 Encounters:  03/07/22 110/78  03/01/22 (!) 164/102  11/29/21 (!) 128/92         Past Medical History:  Diagnosis Date   Abscess    Anemia    Chlamydia    Depression    Gonorrhea    Graves disease    Hypertension    Past Surgical History:  Procedure Laterality Date   WISDOM TOOTH EXTRACTION      reports that she has been smoking cigarettes. She has been smoking an average of .5 packs per day. She uses smokeless tobacco. She reports current drug use. Drug: Marijuana. She reports that she does not drink alcohol. family history includes COPD in her father and mother; Cancer in her father and mother; Heart disease in her mother; Kidney disease in her mother. Allergies  Allergen Reactions   Percocet [Oxycodone-Acetaminophen] Nausea And Vomiting and Other (See Comments)    Sweating, pass out   Vicodin [Hydrocodone-Acetaminophen] Nausea And Vomiting and Other (See Comments)    Sweating, pass out   Current Outpatient Medications on File Prior to Visit  Medication Sig Dispense Refill   levothyroxine (SYNTHROID) 50 MCG tablet Take 50 mcg by mouth daily before breakfast.  propranolol (INDERAL) 10 MG tablet Take 1 tablet (10 mg total) by mouth 2 (two) times daily. 30 tablet 6   valsartan-hydrochlorothiazide (DIOVAN-HCT) 80-12.5 MG tablet Take 1 tablet by mouth daily. 90 tablet 3   buPROPion (WELLBUTRIN XL) 150 MG 24 hr tablet Take 1 tablet (150 mg total) by mouth daily. (Patient not taking: Reported on 03/07/2022) 90 tablet 1   No current facility-administered medications on file prior to visit.        ROS:  All others reviewed and negative.  Objective        PE:  BP 110/78 (BP Location: Right Arm, Patient Position: Sitting, Cuff Size: Normal)   Pulse (!) 115   Ht 5\' 4"  (1.626 m)   Wt 98 lb (44.5 kg)   SpO2 98%   BMI 16.82 kg/m                 Constitutional: Pt appears in NAD               HENT: Head: NCAT.                Right Ear: External ear normal.                  Left Ear: External ear normal.                Eyes: . Pupils are equal, round, and reactive to light. Conjunctivae and EOM are normal               Nose: without d/c or deformity               Neck: Neck supple. Gross normal ROM               Cardiovascular: Normal rate and regular rhythm.                 Pulmonary/Chest: Effort normal and breath sounds without rales or wheezing.                Abd:  Soft, NT, ND, + BS, no organomegaly               Neurological: Pt is alert. At baseline orientation, motor grossly intact               Skin: Skin is warm. No rashes, no other new lesions, LE edema - ***               Psychiatric: Pt behavior is normal without agitation   Micro: none  Cardiac tracings I have personally interpreted today:  none  Pertinent Radiological findings (summarize): none   Lab Results  Component Value Date   WBC 5.9 03/01/2022   HGB 11.3 (L) 03/01/2022   HCT 35.0 (L) 03/01/2022   PLT 226.0 03/01/2022   GLUCOSE 87 03/01/2022   CHOL 162 03/01/2022   TRIG 182.0 (H) 03/01/2022   HDL 40.00 03/01/2022   LDLCALC 85 03/01/2022   ALT 20 03/01/2022   AST 19 03/01/2022   NA 138 03/01/2022   K 4.0 03/01/2022   CL 104 03/01/2022   CREATININE 0.78 03/01/2022   BUN 8 03/01/2022   CO2 28 03/01/2022   TSH 4.17 03/01/2022   HGBA1C 5.5 03/01/2022   Assessment/Plan:  Sarah Fritz is a 41 y.o. Black or African American [2] female with  has a past medical history of Abscess, Anemia, Chlamydia, Depression, Gonorrhea, Graves disease, and Hypertension.  No problem-specific Assessment & Plan notes found  for this encounter.  Followup: No follow-ups on file.  Oliver Barre, MD 03/07/2022 9:44 AM [facility]

## 2022-03-09 DIAGNOSIS — K5900 Constipation, unspecified: Secondary | ICD-10-CM | POA: Insufficient documentation

## 2022-03-09 DIAGNOSIS — R55 Syncope and collapse: Secondary | ICD-10-CM | POA: Insufficient documentation

## 2022-03-09 DIAGNOSIS — D649 Anemia, unspecified: Secondary | ICD-10-CM | POA: Insufficient documentation

## 2022-03-09 NOTE — Assessment & Plan Note (Signed)
Pt encouraged for otc miralax daily

## 2022-03-09 NOTE — Assessment & Plan Note (Signed)
Mild and improved over baseline recently, no overt other bleeding besides menses, presumed iron deficiency - for mvi with iron as above

## 2022-03-09 NOTE — Assessment & Plan Note (Addendum)
Hx is c/w possible vasovagal episode, no convincing hx for siezure, doubt related to few doses wellbutrin and has now discontinued herself, pt declines ecg, labs, echo or other testing today,  to f/u any worsening symptoms or concerns

## 2022-03-09 NOTE — Assessment & Plan Note (Signed)
Pt enocuraged to f/u with gyn, for MVI with iron

## 2022-03-09 NOTE — Assessment & Plan Note (Signed)
Pt counseled to quit smoking, pt not ready 

## 2022-06-01 ENCOUNTER — Ambulatory Visit: Payer: 59 | Admitting: Neurology

## 2022-06-02 ENCOUNTER — Ambulatory Visit: Payer: 59 | Admitting: Emergency Medicine

## 2022-06-06 ENCOUNTER — Ambulatory Visit (HOSPITAL_BASED_OUTPATIENT_CLINIC_OR_DEPARTMENT_OTHER): Payer: 59 | Admitting: Obstetrics & Gynecology

## 2022-06-07 ENCOUNTER — Telehealth: Payer: Self-pay | Admitting: Neurology

## 2022-06-07 NOTE — Telephone Encounter (Signed)
LVM and sent mychart msg informing pt of need to reschedule 12/27 appointment - MD out

## 2022-06-22 ENCOUNTER — Encounter: Payer: Self-pay | Admitting: Emergency Medicine

## 2022-06-22 ENCOUNTER — Ambulatory Visit (INDEPENDENT_AMBULATORY_CARE_PROVIDER_SITE_OTHER): Payer: 59 | Admitting: Emergency Medicine

## 2022-06-22 VITALS — BP 122/80 | HR 77 | Temp 98.8°F | Ht 64.0 in | Wt 102.5 lb

## 2022-06-22 DIAGNOSIS — E890 Postprocedural hypothyroidism: Secondary | ICD-10-CM | POA: Diagnosis not present

## 2022-06-22 DIAGNOSIS — Z23 Encounter for immunization: Secondary | ICD-10-CM | POA: Diagnosis not present

## 2022-06-22 DIAGNOSIS — F17200 Nicotine dependence, unspecified, uncomplicated: Secondary | ICD-10-CM | POA: Diagnosis not present

## 2022-06-22 DIAGNOSIS — I10 Essential (primary) hypertension: Secondary | ICD-10-CM | POA: Diagnosis not present

## 2022-06-22 MED ORDER — VALSARTAN 40 MG PO TABS: 40.0000 mg | ORAL_TABLET | Freq: Every day | ORAL | 3 refills | Status: DC

## 2022-06-22 MED ORDER — LEVOTHYROXINE SODIUM 50 MCG PO TABS: 50.0000 ug | ORAL_TABLET | Freq: Every day | ORAL | 3 refills | Status: DC

## 2022-06-22 NOTE — Assessment & Plan Note (Signed)
Stable.  Clinically euthyroid. Lab Results  Component Value Date   TSH 4.17 03/01/2022  Continue Synthroid 50 mcg daily.

## 2022-06-22 NOTE — Assessment & Plan Note (Signed)
Cardiovascular and cancer risks associated with smoking discussed. Smoking cessation advice given. States she is smoking less.

## 2022-06-22 NOTE — Assessment & Plan Note (Signed)
Well-controlled hypertension.  Valsartan-hydrochlorothiazide 80-12.5 mg was making her dizzy.  She has been taking half a tablet daily.  Requesting lower dose of medication Recommend to start valsartan 40 mg daily.

## 2022-06-22 NOTE — Progress Notes (Signed)
Sarah Fritz 41 y.o.   Chief Complaint  Patient presents with   Follow-up    f/u appt, no concerns     HISTORY OF PRESENT ILLNESS: This is a 41 y.o. female here for 74-month follow-up of hypertension and hypothyroidism. Overall doing very well.  Has no complaints or medical concerns today. BP Readings from Last 3 Encounters:  06/22/22 122/80  03/07/22 110/78  03/01/22 (!) 164/102     HPI   Prior to Admission medications   Medication Sig Start Date End Date Taking? Authorizing Provider  levothyroxine (SYNTHROID) 50 MCG tablet Take 50 mcg by mouth daily before breakfast.   Yes [provider]  valsartan-hydrochlorothiazide (DIOVAN-HCT) 80-12.5 MG tablet Take 1 tablet by mouth daily. 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   Graves disease 07/27/2016   Hyperthyroidism 07/14/2016   Chronic pain syndrome 07/14/2016   Smoking 07/14/2016   Recurrent UTI 04/18/2013    Past Medical History:  Diagnosis Date   Abscess    Anemia    Chlamydia    Depression    Gonorrhea    Graves disease    Hypertension     Past Surgical History:  Procedure Laterality Date   WISDOM TOOTH EXTRACTION      Social History   Socioeconomic History   Marital status: Single    Spouse name: Not on file   Number of children: Not on file   Years of education: Not on file   Highest education level: Not on file  Occupational History   Not on file  Tobacco Use   Smoking  status: Every Day    Packs/day: 0.50    Types: Cigarettes   Smokeless tobacco: Current  Vaping Use   Vaping Use: Never used  Substance and Sexual Activity   Alcohol use: No    Comment: occasional   Drug use: Yes    Types: Marijuana    Comment: Last marijuana 09/01/15   Sexual activity: Yes    Birth control/protection: None  Other Topics Concern   Not on file  Social History Narrative   Not on file   Social Determinants of Health   Financial Resource Strain: Not on file  Food Insecurity: Not on file  Transportation Needs: Not on file  Physical Activity: Not on file  Stress: Not on file  Social Connections: Not on file  Intimate Partner Violence: Not on file    Family History  Problem Relation Age of Onset   Heart disease Mother    Kidney disease Mother    COPD Mother    Cancer Mother    Cancer Father    COPD Father      Review of Systems  Constitutional: Negative.  Negative for chills and fever.  HENT: Negative.  Negative for congestion and sore throat.   Eyes: Negative.   Respiratory: Negative.  Negative for cough and shortness of breath.   Cardiovascular: Negative.  Negative for chest pain and palpitations.  Gastrointestinal: Negative.  Negative for  abdominal pain, nausea and vomiting.  Genitourinary: Negative.   Skin: Negative.  Negative for rash.  Neurological: Negative.  Negative for dizziness and headaches.  All other systems reviewed and are negative.  Today's Vitals   06/22/22 1551  BP: 122/80  Pulse: 77  Temp: 98.8 F (37.1 C)  TempSrc: Oral  Weight: 102 lb 8 oz (46.5 kg)  Height: 5\' 4"  (1.626 m)   Body mass index is 17.59 kg/m.   Physical Exam Vitals reviewed.  Constitutional:      Appearance: Normal appearance.  HENT:     Head: Normocephalic.  Eyes:     Extraocular Movements: Extraocular movements intact.     Pupils: Pupils are equal, round, and reactive to light.  Cardiovascular:     Rate and Rhythm: Normal rate and regular  rhythm.     Pulses: Normal pulses.     Heart sounds: Normal heart sounds.  Pulmonary:     Effort: Pulmonary effort is normal.     Breath sounds: Normal breath sounds.  Musculoskeletal:     Cervical back: No tenderness.  Lymphadenopathy:     Cervical: No cervical adenopathy.  Skin:    General: Skin is warm and dry.  Neurological:     General: No focal deficit present.     Mental Status: She is alert and oriented to person, place, and time.  Psychiatric:        Mood and Affect: Mood normal.        Behavior: Behavior normal.      ASSESSMENT & PLAN: A total of 43 minutes was spent with the patient and counseling/coordination of care regarding preparing for this visit, review of most recent office visit notes, review of multiple chronic medical problems and their management, review of all medications and changes made, cardiovascular risks associated with hypertension, review of most recent blood work results, education on nutrition, smoking cessation advised, prognosis, documentation, and need for follow-up.  Problem List Items Addressed This Visit       Cardiovascular and Mediastinum   Hypertension    Well-controlled hypertension.  Valsartan-hydrochlorothiazide 80-12.5 mg was making her dizzy.  She has been taking half a tablet daily.  Requesting lower dose of medication Recommend to start valsartan 40 mg daily.      Relevant Medications   valsartan (DIOVAN) 40 MG tablet   Essential hypertension - Primary   Relevant Medications   valsartan (DIOVAN) 40 MG tablet     Endocrine   Postablative hypothyroidism    Stable.  Clinically euthyroid. Lab Results  Component Value Date   TSH 4.17 03/01/2022  Continue Synthroid 50 mcg daily.       Relevant Medications   levothyroxine (SYNTHROID) 50 MCG tablet     Other   Smoking    Cardiovascular and cancer risks associated with smoking discussed. Smoking cessation advice given. States she is smoking less.      Other Visit  Diagnoses     Need for vaccination       Relevant Orders   Flu Vaccine QUAD 6+ mos PF IM (Fluarix Quad PF) (Completed)   Current smoker          Patient Instructions  Hypertension, Adult High blood pressure (hypertension) is when the force of blood pumping through the arteries is too strong. The arteries are the blood vessels that carry blood from the heart throughout the body. Hypertension forces the heart to work harder to pump blood and may cause arteries to become narrow or stiff. Untreated  or uncontrolled hypertension can lead to a heart attack, heart failure, a stroke, kidney disease, and other problems. A blood pressure reading consists of a higher number over a lower number. Ideally, your blood pressure should be below 120/80. The first ("top") number is called the systolic pressure. It is a measure of the pressure in your arteries as your heart beats. The second ("bottom") number is called the diastolic pressure. It is a measure of the pressure in your arteries as the heart relaxes. What are the causes? The exact cause of this condition is not known. There are some conditions that result in high blood pressure. What increases the risk? Certain factors may make you more likely to develop high blood pressure. Some of these risk factors are under your control, including: Smoking. Not getting enough exercise or physical activity. Being overweight. Having too much fat, sugar, calories, or salt (sodium) in your diet. Drinking too much alcohol. Other risk factors include: Having a personal history of heart disease, diabetes, high cholesterol, or kidney disease. Stress. Having a family history of high blood pressure and high cholesterol. Having obstructive sleep apnea. Age. The risk increases with age. What are the signs or symptoms? High blood pressure may not cause symptoms. Very high blood pressure (hypertensive crisis) may cause: Headache. Fast or irregular heartbeats  (palpitations). Shortness of breath. Nosebleed. Nausea and vomiting. Vision changes. Severe chest pain, dizziness, and seizures. How is this diagnosed? This condition is diagnosed by measuring your blood pressure while you are seated, with your arm resting on a flat surface, your legs uncrossed, and your feet flat on the floor. The cuff of the blood pressure monitor will be placed directly against the skin of your upper arm at the level of your heart. Blood pressure should be measured at least twice using the same arm. Certain conditions can cause a difference in blood pressure between your right and left arms. If you have a high blood pressure reading during one visit or you have normal blood pressure with other risk factors, you may be asked to: Return on a different day to have your blood pressure checked again. Monitor your blood pressure at home for 1 week or longer. If you are diagnosed with hypertension, you may have other blood or imaging tests to help your health care provider understand your overall risk for other conditions. How is this treated? This condition is treated by making healthy lifestyle changes, such as eating healthy foods, exercising more, and reducing your alcohol intake. You may be referred for counseling on a healthy diet and physical activity. Your health care provider may prescribe medicine if lifestyle changes are not enough to get your blood pressure under control and if: Your systolic blood pressure is above 130. Your diastolic blood pressure is above 80. Your personal target blood pressure may vary depending on your medical conditions, your age, and other factors. Follow these instructions at home: Eating and drinking  Eat a diet that is high in fiber and potassium, and low in sodium, added sugar, and fat. An example of this eating plan is called the DASH diet. DASH stands for Dietary Approaches to Stop Hypertension. To eat this way: Eat plenty of fresh fruits  and vegetables. Try to fill one half of your plate at each meal with fruits and vegetables. Eat whole grains, such as whole-wheat pasta, brown rice, or whole-grain bread. Fill about one fourth of your plate with whole grains. Eat or drink low-fat dairy products, such as skim milk  or low-fat yogurt. Avoid fatty cuts of meat, processed or cured meats, and poultry with skin. Fill about one fourth of your plate with lean proteins, such as fish, chicken without skin, beans, eggs, or tofu. Avoid pre-made and processed foods. These tend to be higher in sodium, added sugar, and fat. Reduce your daily sodium intake. Many people with hypertension should eat less than 1,500 mg of sodium a day. Do not drink alcohol if: Your health care provider tells you not to drink. You are pregnant, may be pregnant, or are planning to become pregnant. If you drink alcohol: Limit how much you have to: 0-1 drink a day for women. 0-2 drinks a day for men. Know how much alcohol is in your drink. In the [place] one drink equals one 12 oz bottle of beer (355 mL), one 5 oz glass of wine (148 mL), or one 1 oz glass of hard liquor (44 mL). Lifestyle  Work with your health care provider to maintain a healthy body weight or to lose weight. Ask what an ideal weight is for you. Get at least 30 minutes of exercise that causes your heart to beat faster (aerobic exercise) most days of the week. Activities may include walking, swimming, or biking. Include exercise to strengthen your muscles (resistance exercise), such as Pilates or lifting weights, as part of your weekly exercise routine. Try to do these types of exercises for 30 minutes at least 3 days a week. Do not use any products that contain nicotine or tobacco. These products include cigarettes, chewing tobacco, and vaping devices, such as e-cigarettes. If you need help quitting, ask your health care provider. Monitor your blood pressure at home as told by your health care  provider. Keep all follow-up visits. This is important. Medicines Take over-the-counter and prescription medicines only as told by your health care provider. Follow directions carefully. Blood pressure medicines must be taken as prescribed. Do not skip doses of blood pressure medicine. Doing this puts you at risk for problems and can make the medicine less effective. Ask your health care provider about side effects or reactions to medicines that you should watch for. Contact a health care provider if you: Think you are having a reaction to a medicine you are taking. Have headaches that keep coming back (recurring). Feel dizzy. Have swelling in your ankles. Have trouble with your vision. Get help right away if you: Develop a severe headache or confusion. Have unusual weakness or numbness. Feel faint. Have severe pain in your chest or abdomen. Vomit repeatedly. Have trouble breathing. These symptoms may be an emergency. Get help right away. Call 911. Do not wait to see if the symptoms will go away. Do not drive yourself to the hospital. Summary Hypertension is when the force of blood pumping through your arteries is too strong. If this condition is not controlled, it may put you at risk for serious complications. Your personal target blood pressure may vary depending on your medical conditions, your age, and other factors. For most people, a normal blood pressure is less than 120/80. Hypertension is treated with lifestyle changes, medicines, or a combination of both. Lifestyle changes include losing weight, eating a healthy, low-sodium diet, exercising more, and limiting alcohol. This information is not intended to replace advice given to you by your health care provider. Make sure you discuss any questions you have with your health care provider. Document Revised: 06/08/2021 Document Reviewed: 06/08/2021 Elsevier Patient Education  2023 [employer].    [place],  MD [place]  [facility]

## 2022-06-22 NOTE — Patient Instructions (Signed)
Hypertension, Adult High blood pressure (hypertension) is when the force of blood pumping through the arteries is too strong. The arteries are the blood vessels that carry blood from the heart throughout the body. Hypertension forces the heart to work harder to pump blood and may cause arteries to become narrow or stiff. Untreated or uncontrolled hypertension can lead to a heart attack, heart failure, a stroke, kidney disease, and other problems. A blood pressure reading consists of a higher number over a lower number. Ideally, your blood pressure should be below 120/80. The first ("top") number is called the systolic pressure. It is a measure of the pressure in your arteries as your heart beats. The second ("bottom") number is called the diastolic pressure. It is a measure of the pressure in your arteries as the heart relaxes. What are the causes? The exact cause of this condition is not known. There are some conditions that result in high blood pressure. What increases the risk? Certain factors may make you more likely to develop high blood pressure. Some of these risk factors are under your control, including: Smoking. Not getting enough exercise or physical activity. Being overweight. Having too much fat, sugar, calories, or salt (sodium) in your diet. Drinking too much alcohol. Other risk factors include: Having a personal history of heart disease, diabetes, high cholesterol, or kidney disease. Stress. Having a family history of high blood pressure and high cholesterol. Having obstructive sleep apnea. Age. The risk increases with age. What are the signs or symptoms? High blood pressure may not cause symptoms. Very high blood pressure (hypertensive crisis) may cause: Headache. Fast or irregular heartbeats (palpitations). Shortness of breath. Nosebleed. Nausea and vomiting. Vision changes. Severe chest pain, dizziness, and seizures. How is this diagnosed? This condition is diagnosed by  measuring your blood pressure while you are seated, with your arm resting on a flat surface, your legs uncrossed, and your feet flat on the floor. The cuff of the blood pressure monitor will be placed directly against the skin of your upper arm at the level of your heart. Blood pressure should be measured at least twice using the same arm. Certain conditions can cause a difference in blood pressure between your right and left arms. If you have a high blood pressure reading during one visit or you have normal blood pressure with other risk factors, you may be asked to: Return on a different day to have your blood pressure checked again. Monitor your blood pressure at home for 1 week or longer. If you are diagnosed with hypertension, you may have other blood or imaging tests to help your health care provider understand your overall risk for other conditions. How is this treated? This condition is treated by making healthy lifestyle changes, such as eating healthy foods, exercising more, and reducing your alcohol intake. You may be referred for counseling on a healthy diet and physical activity. Your health care provider may prescribe medicine if lifestyle changes are not enough to get your blood pressure under control and if: Your systolic blood pressure is above 130. Your diastolic blood pressure is above 80. Your personal target blood pressure may vary depending on your medical conditions, your age, and other factors. Follow these instructions at home: Eating and drinking  Eat a diet that is high in fiber and potassium, and low in sodium, added sugar, and fat. An example of this eating plan is called the DASH diet. DASH stands for Dietary Approaches to Stop Hypertension. To eat this way: Eat   plenty of fresh fruits and vegetables. Try to fill one half of your plate at each meal with fruits and vegetables. Eat whole grains, such as whole-wheat pasta, brown rice, or whole-grain bread. Fill about one  fourth of your plate with whole grains. Eat or drink low-fat dairy products, such as skim milk or low-fat yogurt. Avoid fatty cuts of meat, processed or cured meats, and poultry with skin. Fill about one fourth of your plate with lean proteins, such as fish, chicken without skin, beans, eggs, or tofu. Avoid pre-made and processed foods. These tend to be higher in sodium, added sugar, and fat. Reduce your daily sodium intake. Many people with hypertension should eat less than 1,500 mg of sodium a day. Do not drink alcohol if: Your health care provider tells you not to drink. You are pregnant, may be pregnant, or are planning to become pregnant. If you drink alcohol: Limit how much you have to: 0-1 drink a day for women. 0-2 drinks a day for men. Know how much alcohol is in your drink. In the [place] one drink equals one 12 oz bottle of beer (355 mL), one 5 oz glass of wine (148 mL), or one 1 oz glass of hard liquor (44 mL). Lifestyle  Work with your health care provider to maintain a healthy body weight or to lose weight. Ask what an ideal weight is for you. Get at least 30 minutes of exercise that causes your heart to beat faster (aerobic exercise) most days of the week. Activities may include walking, swimming, or biking. Include exercise to strengthen your muscles (resistance exercise), such as Pilates or lifting weights, as part of your weekly exercise routine. Try to do these types of exercises for 30 minutes at least 3 days a week. Do not use any products that contain nicotine or tobacco. These products include cigarettes, chewing tobacco, and vaping devices, such as e-cigarettes. If you need help quitting, ask your health care provider. Monitor your blood pressure at home as told by your health care provider. Keep all follow-up visits. This is important. Medicines Take over-the-counter and prescription medicines only as told by your health care provider. Follow directions carefully. Blood  pressure medicines must be taken as prescribed. Do not skip doses of blood pressure medicine. Doing this puts you at risk for problems and can make the medicine less effective. Ask your health care provider about side effects or reactions to medicines that you should watch for. Contact a health care provider if you: Think you are having a reaction to a medicine you are taking. Have headaches that keep coming back (recurring). Feel dizzy. Have swelling in your ankles. Have trouble with your vision. Get help right away if you: Develop a severe headache or confusion. Have unusual weakness or numbness. Feel faint. Have severe pain in your chest or abdomen. Vomit repeatedly. Have trouble breathing. These symptoms may be an emergency. Get help right away. Call 911. Do not wait to see if the symptoms will go away. Do not drive yourself to the hospital. Summary Hypertension is when the force of blood pumping through your arteries is too strong. If this condition is not controlled, it may put you at risk for serious complications. Your personal target blood pressure may vary depending on your medical conditions, your age, and other factors. For most people, a normal blood pressure is less than 120/80. Hypertension is treated with lifestyle changes, medicines, or a combination of both. Lifestyle changes include losing weight, eating a healthy,   low-sodium diet, exercising more, and limiting alcohol. This information is not intended to replace advice given to you by your health care provider. Make sure you discuss any questions you have with your health care provider. Document Revised: 06/08/2021 Document Reviewed: 06/08/2021 Elsevier Patient Education  2023 [employer] Inc[employer]  

## 2022-08-10 ENCOUNTER — Ambulatory Visit: Payer: 59 | Admitting: Neurology

## 2022-10-12 ENCOUNTER — Encounter: Payer: Self-pay | Admitting: Emergency Medicine

## 2022-10-19 ENCOUNTER — Ambulatory Visit: Payer: 59 | Admitting: Family Medicine

## 2022-10-22 IMAGING — MG DIGITAL DIAGNOSTIC BILAT W/ TOMO W/ CAD
8 series · 9 of 24 positions shown · non-contrast
Comparison: None.

CLINICAL DATA: Patient noted a palpable RIGHT breast lump
associated with pain 1 month ago. Mass has completely resolved.
Family history is positive for a cousin recently diagnosed with
breast cancer.

EXAM:
DIGITAL DIAGNOSTIC BILATERAL MAMMOGRAM WITH TOMOSYNTHESIS AND CAD
TECHNIQUE: Bilateral digital diagnostic mammography and breast tomosynthesis
was performed. The images were evaluated with computer-aided
detection.

[L MLO synth-2D]
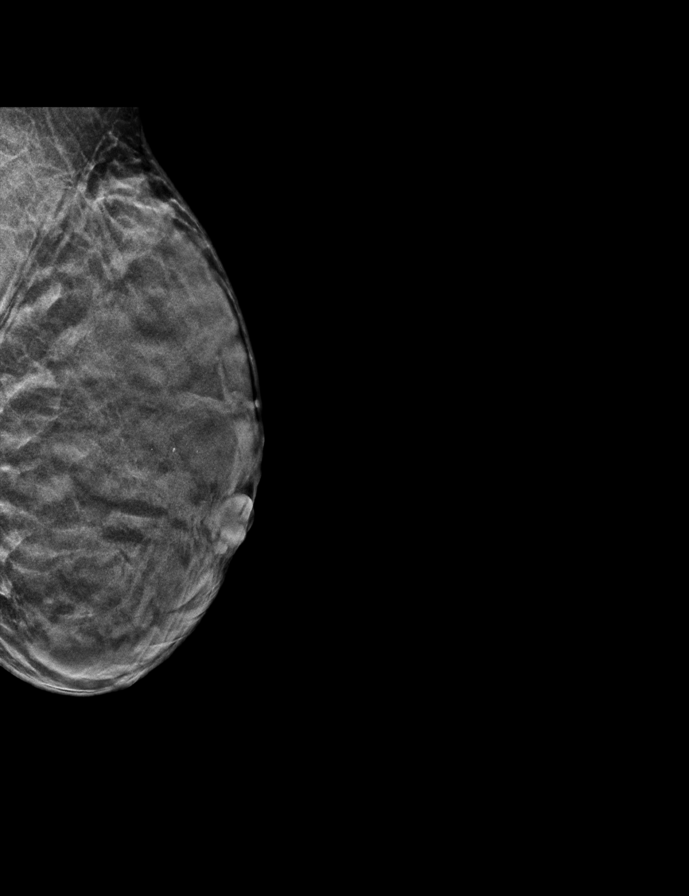

[R CC synth-2D]
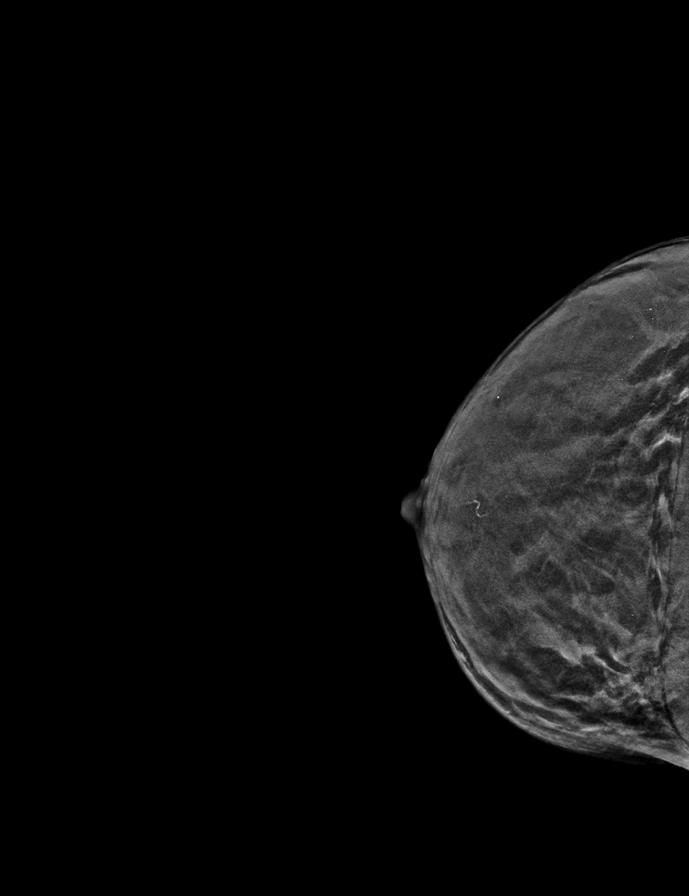

[L CC synth-2D]
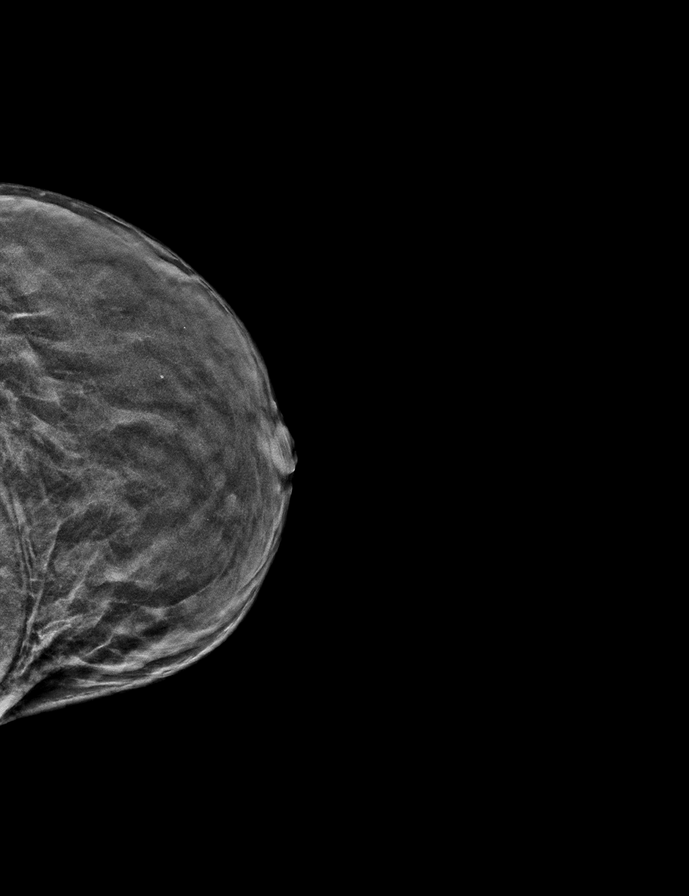

[R MLO synth-2D]
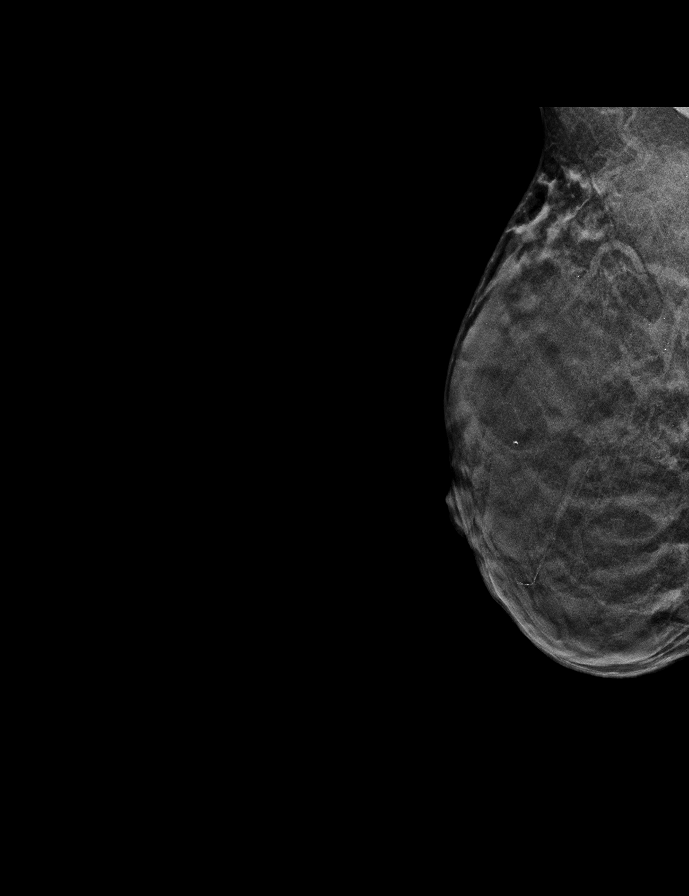

[R MLO tomo · 2 of 32 frames shown]
[frame 11/32]
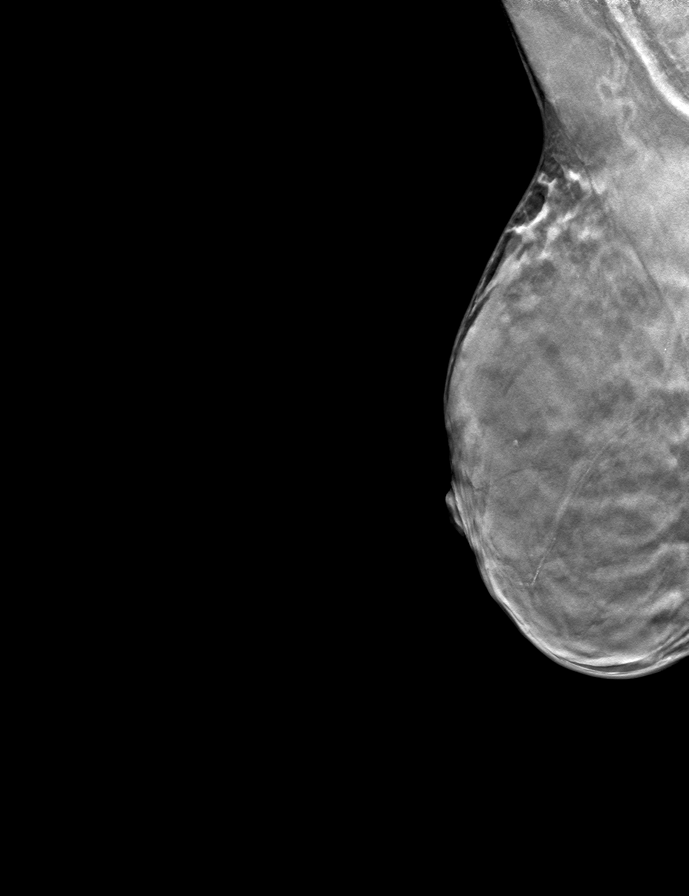
[frame 17/32]
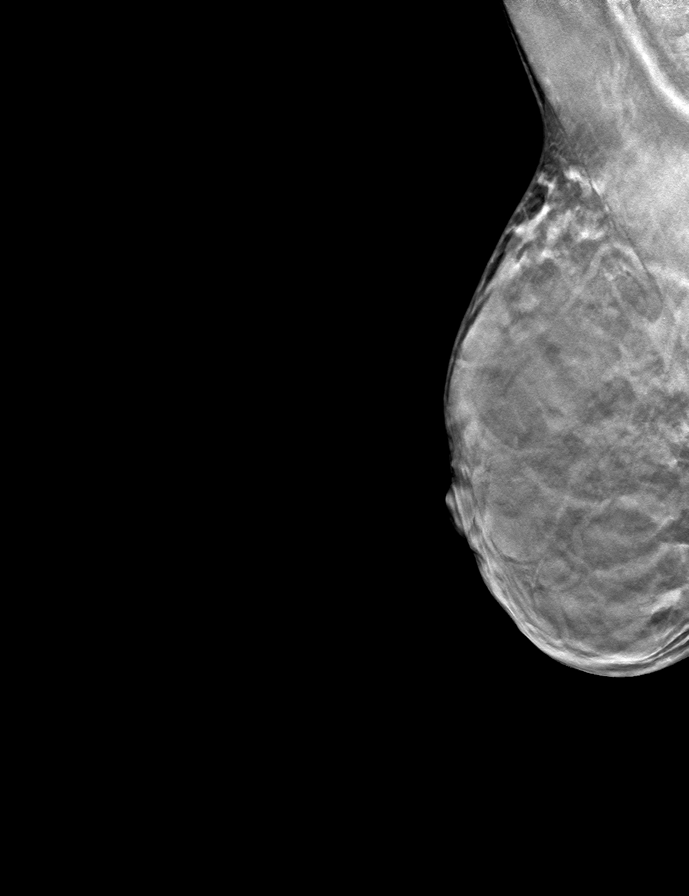

[L MLO tomo · tomo slice 17/34.0]
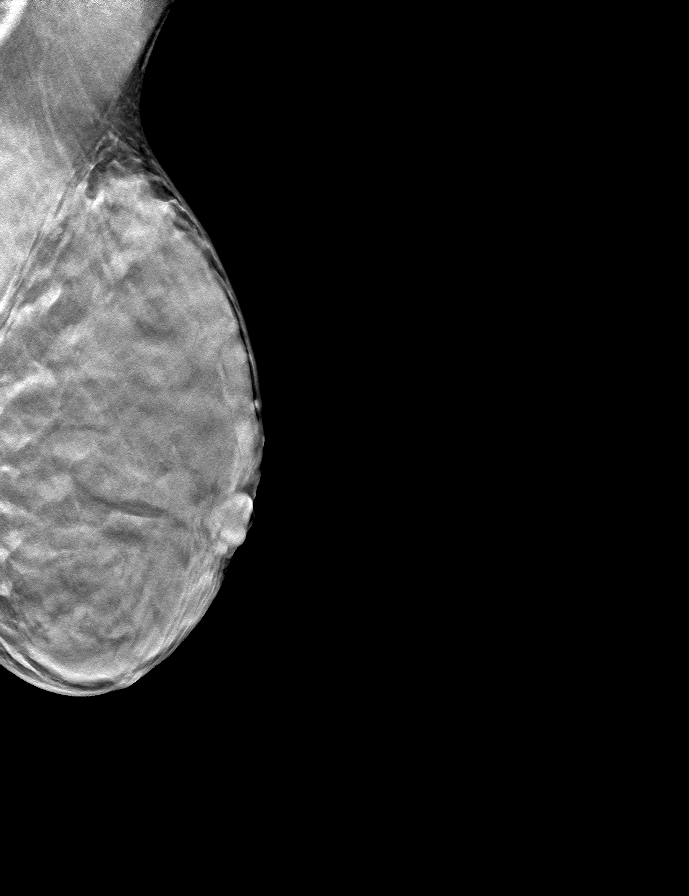

[R CC tomo · tomo slice 17/32.0]
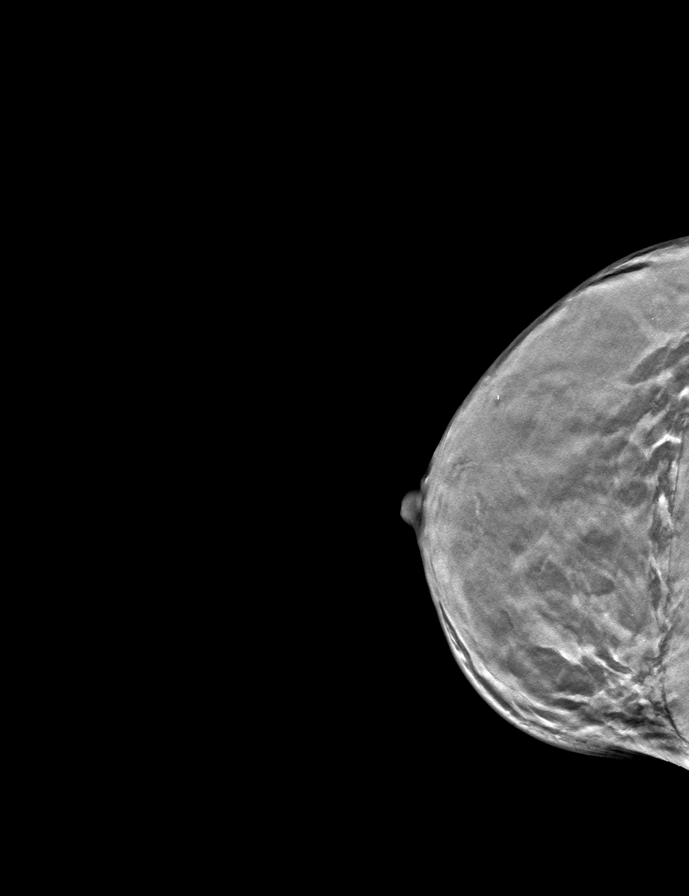

[L CC tomo · tomo slice 17/33.0]
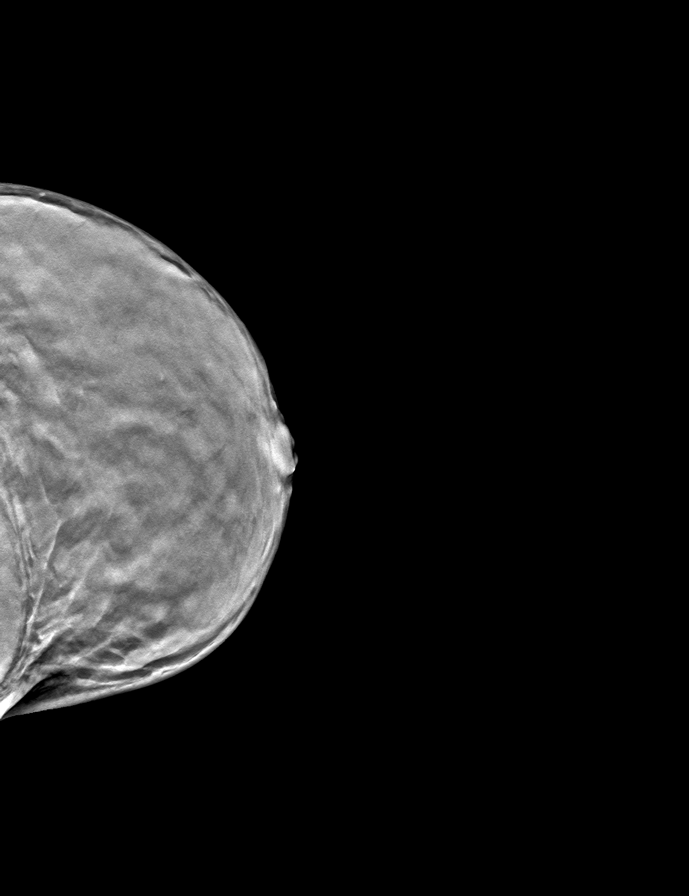

[9 of 24 positions shown; findings below may reference images not displayed]

ACR Breast Density Category d: The breast tissue is extremely dense,
which lowers the sensitivity of mammography.
FINDINGS: No suspicious mass, distortion, or microcalcifications are
identified to suggest presence of malignancy.
IMPRESSION: No mammographic evidence for malignancy.

RECOMMENDATION:
Screening mammogram in one year.(Code:D9-A-IN9)

I have discussed the findings and recommendations with the patient.
If applicable, a reminder letter will be sent to the patient
regarding the next appointment.

BI-RADS CATEGORY  1: Negative.

## 2022-10-25 ENCOUNTER — Ambulatory Visit (INDEPENDENT_AMBULATORY_CARE_PROVIDER_SITE_OTHER): Payer: 59

## 2022-10-25 ENCOUNTER — Other Ambulatory Visit: Payer: Self-pay | Admitting: Family Medicine

## 2022-10-25 ENCOUNTER — Encounter: Payer: Self-pay | Admitting: Physician Assistant

## 2022-10-25 ENCOUNTER — Ambulatory Visit (INDEPENDENT_AMBULATORY_CARE_PROVIDER_SITE_OTHER): Payer: 59 | Admitting: Family Medicine

## 2022-10-25 ENCOUNTER — Encounter: Payer: Self-pay | Admitting: Family Medicine

## 2022-10-25 VITALS — BP 128/84 | HR 85 | Temp 97.6°F | Ht 64.0 in | Wt 103.0 lb

## 2022-10-25 DIAGNOSIS — I10 Essential (primary) hypertension: Secondary | ICD-10-CM

## 2022-10-25 DIAGNOSIS — R0789 Other chest pain: Secondary | ICD-10-CM | POA: Diagnosis not present

## 2022-10-25 DIAGNOSIS — F17200 Nicotine dependence, unspecified, uncomplicated: Secondary | ICD-10-CM

## 2022-10-25 DIAGNOSIS — F4329 Adjustment disorder with other symptoms: Secondary | ICD-10-CM | POA: Diagnosis not present

## 2022-10-25 DIAGNOSIS — E890 Postprocedural hypothyroidism: Secondary | ICD-10-CM

## 2022-10-25 DIAGNOSIS — N6322 Unspecified lump in the left breast, upper inner quadrant: Secondary | ICD-10-CM

## 2022-10-25 DIAGNOSIS — N644 Mastodynia: Secondary | ICD-10-CM

## 2022-10-25 DIAGNOSIS — Z800 Family history of malignant neoplasm of digestive organs: Secondary | ICD-10-CM

## 2022-10-25 LAB — COMPREHENSIVE METABOLIC PANEL
ALT: 21 U/L (ref 0–35)
AST: 23 U/L (ref 0–37)
Albumin: 4.1 g/dL (ref 3.5–5.2)
Alkaline Phosphatase: 44 U/L (ref 39–117)
BUN: 10 mg/dL (ref 6–23)
CO2: 26 mEq/L (ref 19–32)
Calcium: 9.4 mg/dL (ref 8.4–10.5)
Chloride: 106 mEq/L (ref 96–112)
Creatinine, Ser: 0.78 mg/dL (ref 0.40–1.20)
GFR: 93.75 mL/min (ref >60.00)
Glucose, Bld: 86 mg/dL (ref 70–99)
Potassium: 4.2 mEq/L (ref 3.5–5.1)
Sodium: 139 mEq/L (ref 135–145)
Total Bilirubin: 0.5 mg/dL (ref 0.2–1.2)
Total Protein: 7 g/dL (ref 6.0–8.3)

## 2022-10-25 LAB — CBC WITH DIFFERENTIAL/PLATELET
Basophils Absolute: 0 10*3/uL (ref 0.0–0.1)
Basophils Relative: 0.5 % (ref 0.0–3.0)
Eosinophils Absolute: 0.3 10*3/uL (ref 0.0–0.7)
Eosinophils Relative: 8.1 % — ABNORMAL HIGH (ref 0.0–5.0)
HCT: 35.5 % — ABNORMAL LOW (ref 36.0–46.0)
Hemoglobin: 11.6 g/dL — ABNORMAL LOW (ref 12.0–15.0)
Lymphocytes Relative: 28.3 % (ref 12.0–46.0)
Lymphs Abs: 1 10*3/uL (ref 0.7–4.0)
MCHC: 32.6 g/dL (ref 30.0–36.0)
MCV: 76 fl — ABNORMAL LOW (ref 78.0–100.0)
Monocytes Absolute: 0.4 10*3/uL (ref 0.1–1.0)
Monocytes Relative: 12.2 % — ABNORMAL HIGH (ref 3.0–12.0)
Neutro Abs: 1.9 10*3/uL (ref 1.4–7.7)
Neutrophils Relative %: 50.9 % (ref 43.0–77.0)
Platelets: 220 10*3/uL (ref 150.0–400.0)
RBC: 4.67 Mil/uL (ref 3.87–5.11)
RDW: 15.4 % (ref 11.5–15.5)
WBC: 3.6 10*3/uL — ABNORMAL LOW (ref 4.0–10.5)

## 2022-10-25 LAB — TSH: TSH: 7.61 u[IU]/mL — ABNORMAL HIGH (ref 0.35–5.50)

## 2022-10-25 NOTE — Patient Instructions (Addendum)
Go downstairs for labs and a chest X ray.   Please call and schedule with a therapist.   Call and schedule with [facility] for mammogram and breast ultrasound.   You will hear from [facility].   Continue to work on stopping smoking.   Follow up with Dr. Mitchel Honour in 4 weeks.

## 2022-10-25 NOTE — Progress Notes (Addendum)
Subjective:     Patient ID: Sarah Fritz, female    DOB: 1980-12-30, 42 y.o.   MRN: [id number]  Chief Complaint  Patient presents with   Breast Pain    Shooting pain in both breasts, happens at random times. Did self exam, no lumps    HPI Patient is in today for a several month hx of intermittent sharp shooting pains in her breasts that happen more often at night. Lasting for 5 minutes at most. Occurs 2 x per week.   She has also noticed chest tightness.   Denies fever, chills, dizziness, chest pain, palpitations, shortness of breath, abdominal pain, N/V/D, urinary symptoms, LE edema.   Recently started using a raz and smoking fewer cigarettes.   States she is having a lot of stress. Her Dad passed away in 06-21-2023.  He had colon cancer- diagnosed in his 73s. Treated and cancer returned as stage  Mother passed from cancer at age 59. She is not sure which type of cancer.   CHF in mother.   Health Maintenance Due  Topic Date Due   HPV VACCINES (2 - 3-dose SCDM series) 03/27/2019   MAMMOGRAM  02/18/2022   PAP SMEAR-Modifier  02/26/2022    Past Medical History:  Diagnosis Date   Abscess    Anemia    Chlamydia    Depression    Gonorrhea    Graves disease    Hypertension     Past Surgical History:  Procedure Laterality Date   WISDOM TOOTH EXTRACTION      Family History  Problem Relation Age of Onset   Heart disease Mother    Kidney disease Mother    COPD Mother    Cancer Mother    Cancer Father    COPD Father     Social History   Socioeconomic History   Marital status: Single    Spouse name: Not on file   Number of children: Not on file   Years of education: Not on file   Highest education level: Not on file  Occupational History   Not on file  Tobacco Use   Smoking status: Every Day    Packs/day: 0.50    Types: Cigarettes, E-cigarettes   Smokeless tobacco: Current  Vaping Use   Vaping Use: Never used  Substance and Sexual Activity    Alcohol use: No    Comment: occasional   Drug use: Yes    Types: Marijuana    Comment: Last marijuana 09/01/15   Sexual activity: Yes    Birth control/protection: None  Other Topics Concern   Not on file  Social History Narrative   Not on file   Social Determinants of Health   Financial Resource Strain: Not on file  Food Insecurity: Not on file  Transportation Needs: Not on file  Physical Activity: Not on file  Stress: Not on file  Social Connections: Not on file  Intimate Partner Violence: Not on file    Outpatient Medications Prior to Visit  Medication Sig Dispense Refill   levothyroxine (SYNTHROID) 50 MCG tablet Take 1 tablet (50 mcg total) by mouth daily before breakfast. 90 tablet 3   valsartan (DIOVAN) 40 MG tablet Take 1 tablet (40 mg total) by mouth daily. 90 tablet 3   No facility-administered medications prior to visit.    Allergies  Allergen Reactions   Percocet [Oxycodone-Acetaminophen] Nausea And Vomiting and Other (See Comments)    Sweating, pass out   Vicodin [Hydrocodone-Acetaminophen] Nausea And Vomiting and  Other (See Comments)    Sweating, pass out    ROS     Objective:    Physical Exam Exam conducted with a chaperone present.  Constitutional:      General: She is not in acute distress.    Appearance: She is not ill-appearing.  HENT:     Mouth/Throat:     Mouth: Mucous membranes are moist.     Pharynx: Oropharynx is clear.  Eyes:     Extraocular Movements: Extraocular movements intact.     Conjunctiva/sclera: Conjunctivae normal.  Cardiovascular:     Rate and Rhythm: Normal rate and regular rhythm.  Pulmonary:     Effort: Pulmonary effort is normal.     Breath sounds: Normal breath sounds.  Chest:     Chest wall: No tenderness.  Breasts:    Right: Tenderness present. No inverted nipple, mass or nipple discharge.     Left: Mass and tenderness present. No inverted nipple or nipple discharge.       Comments: Pea size mass on medial  left breast, non tender  Musculoskeletal:     Cervical back: Normal range of motion and neck supple.     Right lower leg: No edema.     Left lower leg: No edema.  Lymphadenopathy:     Upper Body:     Right upper body: No supraclavicular adenopathy.     Left upper body: No supraclavicular adenopathy.  Skin:    General: Skin is warm and dry.  Neurological:     General: No focal deficit present.     Mental Status: She is alert and oriented to person, place, and time.  Psychiatric:        Mood and Affect: Mood normal.        Behavior: Behavior normal.        Thought Content: Thought content normal.     BP 128/84 (BP Location: Left Arm, Patient Position: Sitting, Cuff Size: Large)   Pulse 85   Temp 97.6 F (36.4 C) (Temporal)   Ht '5\' 4"'$  (1.626 m)   Wt 103 lb (46.7 kg)   SpO2 98%   BMI 17.68 kg/m  Wt Readings from Last 3 Encounters:  10/25/22 103 lb (46.7 kg)  06/22/22 102 lb 8 oz (46.5 kg)  03/07/22 98 lb (44.5 kg)       Assessment & Plan:   Problem List Items Addressed This Visit       Cardiovascular and Mediastinum   Essential hypertension   Relevant Orders   CBC with Differential/Platelet (Completed)   Comprehensive metabolic panel (Completed)   EKG 12-Lead     Endocrine   Postablative hypothyroidism   Relevant Orders   TSH (Completed)     Other   Breast tenderness   Relevant Orders   MM 3D DIAGNOSTIC MAMMOGRAM BILATERAL BREAST   Chest tightness - Primary   Relevant Orders   CBC with Differential/Platelet (Completed)   Comprehensive metabolic panel (Completed)   DG Chest 2 View (Completed)   EKG 12-Lead   Family history of colon cancer in father   Relevant Orders   Ambulatory referral to Gastroenterology   Mass of upper inner quadrant of left breast   Relevant Orders   MM 3D DIAGNOSTIC MAMMOGRAM BILATERAL BREAST   US LIMITED ULTRASOUND INCLUDING AXILLA LEFT BREAST    Smoker   Relevant Orders   DG Chest 2 View (Completed)   Stress and adjustment  reaction    She normally sees Dr. Mitchel Honour, her PCP.  Here today with several concerns.  Chest tightness intermittent x several weeks, not worsening and on exertional chest pain or associated symptoms. No red flag symptoms.  EKG shows NSR with rate 87. No ST-T wave changes BP controlled.  Breast pain, tenderness and pea size mass of left breast. Mammogram and [place] ordered.  Encouraged smoking cessation.  Recommend seeing a therapist for stress and anxiety related to death of parents. She may call and schedule. No SI Check labs including TSH due to hx of hypothyroidism. CXR ordered. She will follow up with PCP in 4 wks.   I am having Laren Boom maintain her levothyroxine and valsartan.  No orders of the defined types were placed in this encounter.

## 2022-12-01 ENCOUNTER — Encounter: Payer: Self-pay | Admitting: Emergency Medicine

## 2022-12-05 ENCOUNTER — Ambulatory Visit: Payer: 59 | Admitting: Physician Assistant

## 2022-12-09 ENCOUNTER — Ambulatory Visit
Admission: RE | Admit: 2022-12-09 | Discharge: 2022-12-09 | Disposition: A | Discharge status: Home / Self Care | Payer: 59 | Source: Ambulatory Visit | Attending: Family Medicine | Admitting: Family Medicine

## 2022-12-09 ENCOUNTER — Other Ambulatory Visit: Payer: 59

## 2022-12-09 ENCOUNTER — Ambulatory Visit: Payer: 59

## 2022-12-09 DIAGNOSIS — N6322 Unspecified lump in the left breast, upper inner quadrant: Secondary | ICD-10-CM

## 2022-12-09 DIAGNOSIS — N644 Mastodynia: Secondary | ICD-10-CM

## 2022-12-21 ENCOUNTER — Ambulatory Visit: Payer: 59 | Admitting: Emergency Medicine

## 2023-04-20 ENCOUNTER — Encounter: Payer: Self-pay | Admitting: Emergency Medicine

## 2023-04-20 DIAGNOSIS — R252 Cramp and spasm: Secondary | ICD-10-CM | POA: Insufficient documentation

## 2023-04-27 ENCOUNTER — Ambulatory Visit (INDEPENDENT_AMBULATORY_CARE_PROVIDER_SITE_OTHER): Payer: 59 | Admitting: Emergency Medicine

## 2023-04-27 ENCOUNTER — Ambulatory Visit (INDEPENDENT_AMBULATORY_CARE_PROVIDER_SITE_OTHER): Payer: 59

## 2023-04-27 VITALS — BP 138/98 | HR 92 | Temp 98.6°F | Ht 64.0 in | Wt 104.4 lb

## 2023-04-27 DIAGNOSIS — Z23 Encounter for immunization: Secondary | ICD-10-CM

## 2023-04-27 DIAGNOSIS — M5432 Sciatica, left side: Secondary | ICD-10-CM | POA: Diagnosis not present

## 2023-04-27 DIAGNOSIS — D500 Iron deficiency anemia secondary to blood loss (chronic): Secondary | ICD-10-CM

## 2023-04-27 DIAGNOSIS — E559 Vitamin D deficiency, unspecified: Secondary | ICD-10-CM

## 2023-04-27 DIAGNOSIS — F17200 Nicotine dependence, unspecified, uncomplicated: Secondary | ICD-10-CM | POA: Diagnosis not present

## 2023-04-27 DIAGNOSIS — I10 Essential (primary) hypertension: Secondary | ICD-10-CM

## 2023-04-27 DIAGNOSIS — N924 Excessive bleeding in the premenopausal period: Secondary | ICD-10-CM

## 2023-04-27 DIAGNOSIS — R252 Cramp and spasm: Secondary | ICD-10-CM

## 2023-04-27 DIAGNOSIS — E890 Postprocedural hypothyroidism: Secondary | ICD-10-CM

## 2023-04-27 MED ORDER — TRAMADOL HCL 50 MG PO TABS
50.0000 mg | ORAL_TABLET | Freq: Two times a day (BID) | ORAL | 0 refills | Status: AC | PRN
Start: 2023-04-27 — End: 2023-05-02

## 2023-04-27 MED ORDER — ACCRUFER 30 MG PO CAPS
30.0000 mg | ORAL_CAPSULE | Freq: Two times a day (BID) | ORAL | 1 refills | Status: AC
Start: 2023-04-27 — End: 2023-06-26

## 2023-04-27 NOTE — Assessment & Plan Note (Signed)
TSH 10 most recent level Synthroid was increased to 75 mcg daily

## 2023-04-27 NOTE — Assessment & Plan Note (Signed)
Leading to iron deficiency anemia Needs follow-up with her gynecologist

## 2023-04-27 NOTE — Assessment & Plan Note (Signed)
Well-controlled hypertension with normal blood pressure readings at home Continue valsartan 40 mg daily BP Readings from Last 3 Encounters:  04/27/23 (!) 138/98  10/25/22 128/84  06/22/22 122/80

## 2023-04-27 NOTE — Assessment & Plan Note (Signed)
Active and affecting quality of life Over-the-counter medications not helping May use Tylenol for mild to moderate pain and tramadol for moderate to severe pain Recommend x-ray lumbar spine today and orthopedic evaluation Referral placed today

## 2023-04-27 NOTE — Assessment & Plan Note (Signed)
Iron deficiency anemia secondary to chronic blood loss secondary to heavy menses Recommend to start Accrufer 30 mg twice a day

## 2023-04-27 NOTE — Patient Instructions (Signed)
Sciatica  Sciatica is pain, weakness, tingling, or loss of feeling (numbness) along the sciatic nerve. The sciatic nerve starts in the lower back and goes down the back of each leg. Sciatica usually affects one side of the body. Sciatica usually goes away on its own or with treatment. Sometimes, sciatica may come back. What are the causes? This condition happens when the sciatic nerve is pinched or has pressure put on it. This may be caused by: A disk in between the bones of the spine bulging out too far (herniated disk). Changes in the spinal disks due to aging. A condition that affects a muscle in the butt. Extra bone growth near the sciatic nerve. A break (fracture) of the area between your hip bones (pelvis). Pregnancy. Tumor. This is rare. What increases the risk? You are more likely to develop this condition if you: Play sports that put pressure or stress on the spine. Have poor strength and ease of movement (flexibility). Have had a back injury or back surgery. Sit for long periods of time. Do activities that involve bending or lifting over and over again. Are very overweight (obese). What are the signs or symptoms? Symptoms can vary from mild to very bad. They may include: Any of these problems in the lower back, leg, hip, or butt: Mild tingling, loss of feeling, or dull aches. A burning feeling. Sharp pains. Loss of feeling in the back of the calf or the sole of the foot. Leg weakness. Very bad back pain that makes it hard to move. These symptoms may get worse when you cough, sneeze, or laugh. They may also get worse when you sit or stand for long periods of time. How is this treated? This condition often gets better without any treatment. However, treatment may include: Changing or cutting back on physical activity when you have pain. Exercising, including strengthening and stretching. Putting ice or heat on the affected area. Shots of medicines to relieve pain and  swelling or to relax your muscles. Surgery. Follow these instructions at home: Medicines Take over-the-counter and prescription medicines only as told by your doctor. Ask your doctor if you should avoid driving or using machines while you are taking your medicine. Managing pain     If told, put ice on the affected area. To do this: Put ice in a plastic bag. Place a towel between your skin and the bag. Leave the ice on for 20 minutes, 2-3 times a day. If your skin turns bright red, take off the ice right away to prevent skin damage. The risk of skin damage is higher if you cannot feel pain, heat, or cold. If told, put heat on the affected area. Do this as often as told by your doctor. Use the heat source that your doctor tells you to use, such as a moist heat pack or a heating pad. Place a towel between your skin and the heat source. Leave the heat on for 20-30 minutes. If your skin turns bright red, take off the heat right away to prevent burns. The risk of burns is higher if you cannot feel pain, heat, or cold. Activity  Return to your normal activities when your doctor says that it is safe. Avoid activities that make your symptoms worse. Take short rests during the day. When you rest for a long time, do some physical activity or stretching between periods of rest. Avoid sitting for a long time without moving. Get up and move around at least one time each   hour. Do exercises and stretches as told by your doctor. Do not lift anything that is heavier than 10 lb (4.5 kg). Avoid lifting heavy things even when you do not have symptoms. Avoid lifting heavy things over and over. When you lift objects, always lift in a way that is safe for your body. To do this, you should: Bend your knees. Keep the object close to your body. Avoid twisting. General instructions Stay at a healthy weight. Wear comfortable shoes that support your feet. Avoid wearing high heels. Avoid sleeping on a mattress  that is too soft or too hard. You might have less pain if you sleep on a mattress that is firm enough to support your back. Contact a doctor if: Your pain is not controlled by medicine. Your pain does not get better. Your pain gets worse. Your pain lasts longer than 4 weeks. You lose weight without trying. Get help right away if: You cannot control when you pee (urinate) or poop (have a bowel movement). You have weakness in any of these areas and it gets worse: Lower back. The area between your hip bones. Butt. Legs. You have redness or swelling of your back. You have a burning feeling when you pee. Summary Sciatica is pain, weakness, tingling, or loss of feeling (numbness) along the sciatic nerve. This may include the lower back, legs, hips, and butt. This condition happens when the sciatic nerve is pinched or has pressure put on it. Treatment often includes rest, exercise, medicines, and putting ice or heat on the affected area. This information is not intended to replace advice given to you by your health care provider. Make sure you discuss any questions you have with your health care provider. Document Revised: 11/08/2021 Document Reviewed: 11/08/2021 [employer] Patient Education  2024 [employer]  

## 2023-04-27 NOTE — Assessment & Plan Note (Signed)
Recently started on high dose weekly vitamin D supplementation

## 2023-04-27 NOTE — Progress Notes (Signed)
Sarah Fritz 42 y.o.   Chief Complaint  Patient presents with   Medical Management of Chronic Issues    Patient states she seen the endo provider had labs done, Have some questions pertaining to labs,   Patient states she is very fatigued, headaches, body aches    HISTORY OF PRESENT ILLNESS: This is a 42 y.o. female complaining of pain to both legs this started about 2 weeks ago Mostly left leg.  Pain starts in the left lumbar spine with radiation down the back of her leg.  Denies injury. History of hypothyroidism.  Recently seen by endocrinologist Dr. Allena Katz.  Elevated TSH.  Had Synthroid dose increased to 75 mcg. Blood work also showed low vitamin D levels.  Started on high-dose weekly vitamin D supplementation Blood work also showed iron deficiency anemia.  Patient has history of irregular long periods with excessive bleeding. Complaining of fatigue, intermittent headaches and bodyaches.  HPI   Prior to Admission medications   Medication Sig Start Date End Date Taking? Authorizing Provider  levothyroxine (SYNTHROID) 50 MCG tablet Take 1 tablet (50 mcg total) by mouth daily before breakfast. 06/22/22  Yes Rynlee Lisbon, Eilleen Kempf, MD  valsartan (DIOVAN) 40 MG tablet Take 1 tablet (40 mg total) by mouth daily. 06/22/22  Yes Aneli Zara, Eilleen Kempf, MD  Vitamin D, Ergocalciferol, (DRISDOL) 1.25 MG (50000 UNIT) CAPS capsule Take 1 capsule by mouth once a week. 04/21/23  Yes [provider]    Allergies  Allergen Reactions   Percocet [Oxycodone-Acetaminophen] Nausea And Vomiting and Other (See Comments)    Sweating, pass out   Vicodin [Hydrocodone-Acetaminophen] Nausea And Vomiting and Other (See Comments)    Sweating, pass out    Patient Active Problem List   Diagnosis Date Noted   Leg cramps 04/20/2023   Breast tenderness 10/25/2022   Stress and adjustment reaction 10/25/2022   Mass of upper inner quadrant of left breast 10/25/2022   Family history of colon cancer in  father 10/25/2022   Essential hypertension 06/22/2022   Anemia 03/09/2022   Constipation 03/09/2022   Heavy menses 03/07/2022   Hypertension 03/01/2022   Daily headache 11/17/2021   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   Graves disease 07/27/2016   Hyperthyroidism 07/14/2016   Chronic pain syndrome 07/14/2016   Smoker 07/14/2016   Recurrent UTI 04/18/2013    Past Medical History:  Diagnosis Date   Abscess    Anemia    Chlamydia    Depression    Gonorrhea    Graves disease    Hypertension     Past Surgical History:  Procedure Laterality Date   WISDOM TOOTH EXTRACTION      Social History   Socioeconomic History   Marital status: Single    Spouse name: Not on file   Number of children: Not on file   Years of education: Not on file   Highest education level: Associate degree: occupational, Scientist, product/process development, or vocational program  Occupational History   Not on file  Tobacco Use   Smoking status: Every Day    Current packs/day: 0.50    Types: Cigarettes, E-cigarettes   Smokeless tobacco: Current  Vaping Use   Vaping status: Never Used  Substance and Sexual Activity   Alcohol use: No    Comment: occasional   Drug use: Yes    Types: Marijuana    Comment: Last marijuana 09/01/15   Sexual activity: Yes    Birth control/protection: None  Other Topics Concern   Not on file  Social History  Narrative   Not on file   Social Determinants of Health   Financial Resource Strain: Low Risk  (04/27/2023)   Overall Financial Resource Strain (CARDIA)    Difficulty of Paying Living Expenses: Not hard at all  Food Insecurity: No Food Insecurity (04/27/2023)   Hunger Vital Sign    Worried About Running Out of Food in the Last Year: Never true    Ran Out of Food in the Last Year: Never true  Transportation Needs: No Transportation Needs (04/27/2023)   PRAPARE - Administrator, Civil Service (Medical): No    Lack of Transportation (Non-Medical): No  Physical Activity:  Insufficiently Active (04/27/2023)   Exercise Vital Sign    Days of Exercise per Week: 2 days    Minutes of Exercise per Session: 20 min  Stress: Stress Concern Present (04/27/2023)   [employer] of Occupational Health - Occupational Stress Questionnaire    Feeling of Stress : Very much  Social Connections: Unknown (04/27/2023)   Social Connection and Isolation Panel [NHANES]    Frequency of Communication with Friends and Family: Twice a week    Frequency of Social Gatherings with Friends and Family: Once a week    Attends Religious Services: Patient declined    Database administrator or Organizations: No    Attends Engineer, structural: Not on file    Marital Status: Living with partner  Intimate Partner Violence: Not on file    Family History  Problem Relation Age of Onset   Heart disease Mother    Kidney disease Mother    COPD Mother    Cancer Mother    Cancer Father    COPD Father      Review of Systems  Constitutional:  Positive for malaise/fatigue. Negative for chills and fever.  HENT: Negative.  Negative for congestion and sore throat.   Respiratory: Negative.  Negative for cough and hemoptysis.   Cardiovascular:  Negative for chest pain and palpitations.  Gastrointestinal:  Negative for abdominal pain, diarrhea, nausea and vomiting.  Musculoskeletal:  Positive for back pain.  Skin: Negative.  Negative for rash.  Neurological: Negative.  Negative for dizziness and headaches.  All other systems reviewed and are negative.   Vitals:   04/27/23 1455  BP: (!) 138/98  Pulse: 92  Temp: 98.6 F (37 C)  SpO2: 100%    Physical Exam Vitals reviewed.  Constitutional:      Appearance: Normal appearance.  HENT:     Head: Normocephalic.     Mouth/Throat:     Mouth: Mucous membranes are moist.     Pharynx: Oropharynx is clear.  Eyes:     Extraocular Movements: Extraocular movements intact.     Conjunctiva/sclera: Conjunctivae normal.     Pupils: Pupils  are equal, round, and reactive to light.  Cardiovascular:     Rate and Rhythm: Normal rate and regular rhythm.     Pulses: Normal pulses.     Heart sounds: Normal heart sounds.  Pulmonary:     Effort: Pulmonary effort is normal.     Breath sounds: Normal breath sounds.  Musculoskeletal:     Cervical back: No tenderness.     Right lower leg: No edema.     Left lower leg: No edema.  Lymphadenopathy:     Cervical: No cervical adenopathy.  Skin:    General: Skin is warm and dry.     Capillary Refill: Capillary refill takes less than 2 seconds.  Neurological:  General: No focal deficit present.     Mental Status: She is alert and oriented to person, place, and time.  Psychiatric:        Mood and Affect: Mood normal.        Behavior: Behavior normal.    DG Lumbar Spine 2-3 Views  Result Date: 04/27/2023 CLINICAL DATA:  Left-sided lumbar pain EXAM: LUMBAR SPINE - 2-3 VIEW COMPARISON:  None available FINDINGS: There is no evidence of lumbar spine fracture. Alignment is normal. Intervertebral disc spaces are maintained. IMPRESSION: Unremarkable radiographic appearance of the lumbar spine. Electronically Signed   By: Acquanetta Belling M.D.   On: 04/27/2023 16:29     ASSESSMENT & PLAN: A total of 47 minutes was spent with the patient and counseling/coordination of care regarding preparing for this visit, review of most recent office visit notes, review of most recent endocrinologist office visit notes, review of most recent blood work results, review of multiple chronic medical conditions under management, education on nutrition, review of all medications, prognosis, documentation, and need for follow-up.  Problem List Items Addressed This Visit       Cardiovascular and Mediastinum   Essential hypertension    Well-controlled hypertension with normal blood pressure readings at home Continue valsartan 40 mg daily BP Readings from Last 3 Encounters:  04/27/23 (!) 138/98  10/25/22 128/84   06/22/22 122/80           Endocrine   Postablative hypothyroidism    TSH 10 most recent level Synthroid was increased to 75 mcg daily        Nervous and Auditory   Sciatica, left side - Primary    Active and affecting quality of life Over-the-counter medications not helping May use Tylenol for mild to moderate pain and tramadol for moderate to severe pain Recommend x-ray lumbar spine today and orthopedic evaluation Referral placed today       Relevant Medications   traMADol (ULTRAM) 50 MG tablet   Other Relevant Orders   Ambulatory referral to Orthopedic Surgery   DG Lumbar Spine 2-3 Views (Completed)     Other   Smoker   Heavy menses    Leading to iron deficiency anemia Needs follow-up with her gynecologist      Anemia    Iron deficiency anemia secondary to chronic blood loss secondary to heavy menses Recommend to start Accrufer 30 mg twice a day      Relevant Medications   Ferric Maltol (ACCRUFER) 30 MG CAPS   Leg cramps    Iron deficiency anemia contributing Sciatica contributing      Vitamin D deficiency    Recently started on high dose weekly vitamin D supplementation      Other Visit Diagnoses     Need for vaccination       Relevant Orders   Flu vaccine trivalent PF, 6mos and older(Flulaval,Afluria,Fluarix,Fluzone) (Completed)      Patient Instructions  Sciatica  Sciatica is pain, weakness, tingling, or loss of feeling (numbness) along the sciatic nerve. The sciatic nerve starts in the lower back and goes down the back of each leg. Sciatica usually affects one side of the body. Sciatica usually goes away on its own or with treatment. Sometimes, sciatica may come back. What are the causes? This condition happens when the sciatic nerve is pinched or has pressure put on it. This may be caused by: A disk in between the bones of the spine bulging out too far (herniated disk). Changes in the spinal disks due  to aging. A condition that affects a  muscle in the butt. Extra bone growth near the sciatic nerve. A break (fracture) of the area between your hip bones (pelvis). Pregnancy. Tumor. This is rare. What increases the risk? You are more likely to develop this condition if you: Play sports that put pressure or stress on the spine. Have poor strength and ease of movement (flexibility). Have had a back injury or back surgery. Sit for long periods of time. Do activities that involve bending or lifting over and over again. Are very overweight (obese). What are the signs or symptoms? Symptoms can vary from mild to very bad. They may include: Any of these problems in the lower back, leg, hip, or butt: Mild tingling, loss of feeling, or dull aches. A burning feeling. Sharp pains. Loss of feeling in the back of the calf or the sole of the foot. Leg weakness. Very bad back pain that makes it hard to move. These symptoms may get worse when you cough, sneeze, or laugh. They may also get worse when you sit or stand for long periods of time. How is this treated? This condition often gets better without any treatment. However, treatment may include: Changing or cutting back on physical activity when you have pain. Exercising, including strengthening and stretching. Putting ice or heat on the affected area. Shots of medicines to relieve pain and swelling or to relax your muscles. Surgery. Follow these instructions at home: Medicines Take over-the-counter and prescription medicines only as told by your doctor. Ask your doctor if you should avoid driving or using machines while you are taking your medicine. Managing pain     If told, put ice on the affected area. To do this: Put ice in a plastic bag. Place a towel between your skin and the bag. Leave the ice on for 20 minutes, 2-3 times a day. If your skin turns bright red, take off the ice right away to prevent skin damage. The risk of skin damage is higher if you cannot feel pain,  heat, or cold. If told, put heat on the affected area. Do this as often as told by your doctor. Use the heat source that your doctor tells you to use, such as a moist heat pack or a heating pad. Place a towel between your skin and the heat source. Leave the heat on for 20-30 minutes. If your skin turns bright red, take off the heat right away to prevent burns. The risk of burns is higher if you cannot feel pain, heat, or cold. Activity  Return to your normal activities when your doctor says that it is safe. Avoid activities that make your symptoms worse. Take short rests during the day. When you rest for a long time, do some physical activity or stretching between periods of rest. Avoid sitting for a long time without moving. Get up and move around at least one time each hour. Do exercises and stretches as told by your doctor. Do not lift anything that is heavier than 10 lb (4.5 kg). Avoid lifting heavy things even when you do not have symptoms. Avoid lifting heavy things over and over. When you lift objects, always lift in a way that is safe for your body. To do this, you should: Bend your knees. Keep the object close to your body. Avoid twisting. General instructions Stay at a healthy weight. Wear comfortable shoes that support your feet. Avoid wearing high heels. Avoid sleeping on a mattress that is too soft  or too hard. You might have less pain if you sleep on a mattress that is firm enough to support your back. Contact a doctor if: Your pain is not controlled by medicine. Your pain does not get better. Your pain gets worse. Your pain lasts longer than 4 weeks. You lose weight without trying. Get help right away if: You cannot control when you pee (urinate) or poop (have a bowel movement). You have weakness in any of these areas and it gets worse: Lower back. The area between your hip bones. Butt. Legs. You have redness or swelling of your back. You have a burning feeling  when you pee. Summary Sciatica is pain, weakness, tingling, or loss of feeling (numbness) along the sciatic nerve. This may include the lower back, legs, hips, and butt. This condition happens when the sciatic nerve is pinched or has pressure put on it. Treatment often includes rest, exercise, medicines, and putting ice or heat on the affected area. This information is not intended to replace advice given to you by your health care provider. Make sure you discuss any questions you have with your health care provider. Document Revised: 11/08/2021 Document Reviewed: 11/08/2021 Elsevier Patient Education  2024 Elsevier Inc.    Edwina Barth, MD [facility]

## 2023-04-27 NOTE — Assessment & Plan Note (Signed)
Iron deficiency anemia contributing Sciatica contributing

## 2023-05-01 ENCOUNTER — Ambulatory Visit (INDEPENDENT_AMBULATORY_CARE_PROVIDER_SITE_OTHER): Payer: 59 | Admitting: Physical Medicine and Rehabilitation

## 2023-05-01 ENCOUNTER — Encounter: Payer: Self-pay | Admitting: Physical Medicine and Rehabilitation

## 2023-05-01 DIAGNOSIS — M545 Low back pain, unspecified: Secondary | ICD-10-CM | POA: Diagnosis not present

## 2023-05-01 DIAGNOSIS — G8929 Other chronic pain: Secondary | ICD-10-CM | POA: Diagnosis not present

## 2023-05-01 DIAGNOSIS — M25552 Pain in left hip: Secondary | ICD-10-CM

## 2023-05-01 DIAGNOSIS — M7918 Myalgia, other site: Secondary | ICD-10-CM

## 2023-05-01 MED ORDER — CYCLOBENZAPRINE HCL 10 MG PO TABS: 10.0000 mg | ORAL_TABLET | Freq: Every evening | ORAL | 0 refills | Status: DC | PRN

## 2023-05-01 MED ORDER — MELOXICAM 15 MG PO TABS: 15.0000 mg | ORAL_TABLET | Freq: Every day | ORAL | 0 refills | Status: DC

## 2023-05-01 NOTE — Progress Notes (Signed)
Functional Pain Scale - descriptive words and definitions  Intense (8)    Cannot complete any ADLs without much assistance/cannot concentrate/conversation is difficult/unable to sleep and unable to use distraction. Severe range order Pain started when patient was dancing at a party. Pain started at her ankle and progressed up to hip ( posterior lower back) .   Average Pain 8

## 2023-05-01 NOTE — Progress Notes (Signed)
Sarah Fritz - 42 y.o. female MRN [id number]  Date of birth: 12/25/1980  Office Visit Note: Visit Date: 05/01/2023 PCP: Georgina Quint, MD Referred by: Georgina Quint, *  Subjective: Chief Complaint  Patient presents with   Left Hip - Pain   HPI: Sarah Fritz is a 42 y.o. female who comes in today per the request of Dr. Edwina Barth for evaluation of acute onset of left ankle pain radiating up left leg to left lower back. Her pain is now more localized to left lower back and left lateral hip regions. Pain started after dancing at child's birthday party on 04/15/2023. Her pain worsens with movement and activity, she describes pain as sore and nagging sensation, currently 6 out of 10. No history of conservative therapies at home. Not able to tolerate Tramadol from PCP due to nausea. Recent lumbar x-rays exhibits normal alignment, no spondylolisthesis, well maintained disc spacing, no fractures. Patient currently working from home for [employer]. States she often sits on her bed for long hours each day working on her computer, she reports very sedentary lifestyle. Patient denies focal weakness, numbness and tingling. No recent trauma or falls.    Oswestry Disability Index Score 4% 0 to 10 (20%)   minimal disability: The patient can cope with most living activities. Usually no treatment is indicated apart from advice on lifting sitting and exercise.  Review of Systems  Musculoskeletal:  Positive for back pain and myalgias.  Neurological:  Negative for tingling, sensory change, focal weakness and weakness.  All other systems reviewed and are negative.  Otherwise per HPI.  Assessment & Plan: Visit Diagnoses:    ICD-10-CM   1. Chronic left-sided low back pain without sciatica  M54.50    G89.29     2. Pain in left hip  M25.552     3. Myofascial pain syndrome  M79.18        Plan: Findings:  Acute left lower back pain radiating to left lateral hip  region. Left leg symptoms have subsided over the last week. Patient continues to have severe pain left lower back. Her clinical presentation and exam are consistent with myofascial strain. There is localized area of pain to left lower back that is tender upon palpation, likely a myofascial trigger point. Recent lumbar x-rays were unremarkable. We discussed treatment plan in detail today. I placed order for short course of formal physical therapy, I do feel she would benefit from core strengthening, posture work, manual treatments and possible dry needling. I would for patient to increase her physical activity, I encouraged her to get up and walk on her breaks during the day. I also discussed medication management and prescribed Meloxicam and Flexeril. I informed her that myofascial strain can take 6-8 weeks to heal. She can follow up with [place] as needed. If her pain persists or seems to be more radicular in nature would consider order lumbar MRI imaging. No red flag symptoms noted upon exam today.     Meds & Orders: No orders of the defined types were placed in this encounter.  No orders of the defined types were placed in this encounter.   Follow-up: Return if symptoms worsen or fail to improve.   Procedures: No procedures performed      Clinical History: Narrative & Impression CLINICAL DATA:  Left-sided lumbar pain   EXAM: LUMBAR SPINE - 2-3 VIEW   COMPARISON:  None available   FINDINGS: There is no evidence of lumbar spine fracture.  Alignment is normal. Intervertebral disc spaces are maintained.   IMPRESSION: Unremarkable radiographic appearance of the lumbar spine.     Electronically Signed   By: Acquanetta Belling M.D.   On: 04/27/2023 16:29   She reports that she has been smoking cigarettes and e-cigarettes. She uses smokeless tobacco. No results for input(s): "HGBA1C", "LABURIC" in the last 8760 hours.  Objective:  VS:  HT:    WT:   BMI:     BP:   HR: bpm  TEMP: ( )  RESP:   Physical Exam Vitals and nursing note reviewed.  HENT:     Head: Normocephalic and atraumatic.     Right Ear: External ear normal.     Left Ear: External ear normal.     Nose: Nose normal.     Mouth/Throat:     Mouth: Mucous membranes are moist.  Eyes:     Extraocular Movements: Extraocular movements intact.  Cardiovascular:     Rate and Rhythm: Normal rate.     Pulses: Normal pulses.  Pulmonary:     Effort: Pulmonary effort is normal.  Abdominal:     General: Abdomen is flat. There is no distension.  Musculoskeletal:        General: Tenderness present.     Cervical back: Normal range of motion.     Comments: Patient rises from seated position to standing without difficulty. Good lumbar range of motion. No pain noted with facet loading. 5/5 strength noted with bilateral hip flexion, knee flexion/extension, ankle dorsiflexion/plantarflexion and EHL. No clonus noted bilaterally. No pain upon palpation of greater trochanters. No pain with internal/external rotation of bilateral hips. Sensation intact bilaterally. Palpable trigger point noted to left quadratus lumborum region. Negative slump test bilaterally. Ambulates without aid, gait steady.     Skin:    General: Skin is warm and dry.     Capillary Refill: Capillary refill takes less than 2 seconds.  Neurological:     General: No focal deficit present.     Mental Status: She is alert and oriented to person, place, and time.  Psychiatric:        Mood and Affect: Mood normal.        Behavior: Behavior normal.     Ortho Exam  Imaging: No results found.  Past Medical/Family/Surgical/Social History: Medications & Allergies reviewed per EMR, new medications updated. 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   Graves disease 07/27/2016   Hyperthyroidism 07/14/2016   Chronic pain syndrome 07/14/2016   Smoker 07/14/2016   Recurrent UTI 04/18/2013   Past Medical History:  Diagnosis Date   Abscess    Anemia    Chlamydia    Depression    Gonorrhea    Graves disease    Hypertension    Family History  Problem Relation Age of Onset   Heart disease Mother    Kidney disease Mother    COPD Mother    Cancer Mother    Cancer Father    COPD Father  Past Surgical History:  Procedure Laterality Date   WISDOM TOOTH EXTRACTION     Social History   Occupational History   Not on file  Tobacco Use   Smoking status: Every Day    Current packs/day: 0.50    Types: Cigarettes, E-cigarettes   Smokeless tobacco: Current  Vaping Use   Vaping status: Never Used  Substance and Sexual Activity   Alcohol use: No    Comment: occasional   Drug use: Yes    Types: Marijuana    Comment: Last marijuana 09/01/15   Sexual activity: Yes    Birth control/protection: None

## 2023-05-02 ENCOUNTER — Encounter: Payer: Self-pay | Admitting: *Deleted

## 2023-05-10 ENCOUNTER — Encounter: Payer: Self-pay | Admitting: Physical Therapy

## 2023-05-10 ENCOUNTER — Other Ambulatory Visit: Payer: Self-pay

## 2023-05-10 ENCOUNTER — Ambulatory Visit (INDEPENDENT_AMBULATORY_CARE_PROVIDER_SITE_OTHER): Payer: 59 | Admitting: Physical Therapy

## 2023-05-10 ENCOUNTER — Other Ambulatory Visit (HOSPITAL_COMMUNITY): Payer: Self-pay

## 2023-05-10 DIAGNOSIS — M5459 Other low back pain: Secondary | ICD-10-CM | POA: Diagnosis not present

## 2023-05-10 NOTE — Therapy (Addendum)
OUTPATIENT PHYSICAL THERAPY THORACOLUMBAR EVALUATION  / DISCHARGE   Patient Name: Sarah Fritz MRN: [id number] DOB:07/04/1981, 42 y.o., female Today's Date: 05/10/2023  END OF SESSION:  PT End of Session - 05/10/23 1532     Visit Number 1    Number of Visits 4    Date for PT Re-Evaluation 06/21/23    PT Start Time 1430    PT Stop Time 1510    PT Time Calculation (min) 40 min    Activity Tolerance Patient tolerated treatment well    Behavior During Therapy [facility] for tasks assessed/performed             Past Medical History:  Diagnosis Date   Abscess    Anemia    Chlamydia    Depression    Gonorrhea    Graves disease    Hypertension    Past Surgical History:  Procedure Laterality Date   WISDOM TOOTH EXTRACTION     Patient Active Problem List   Diagnosis Date Noted   Sciatica, left side 04/27/2023   Vitamin D deficiency 04/27/2023   Leg cramps 04/20/2023   Breast tenderness 10/25/2022   Stress and adjustment reaction 10/25/2022   Mass of upper inner quadrant of left breast 10/25/2022   Family history of colon cancer in father 10/25/2022   Essential hypertension 06/22/2022   Anemia 03/09/2022   Constipation 03/09/2022   Heavy menses 03/07/2022   Hypertension 03/01/2022   Daily headache 11/17/2021   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   Graves disease 07/27/2016   Hyperthyroidism 07/14/2016   Chronic pain syndrome 07/14/2016   Smoker 07/14/2016   Recurrent UTI 04/18/2013    PCP: Georgina Quint, MD   REFERRING PROVIDER: Juanda Chance, NP  REFERRING DIAG: [phone] (ICD-10-CM) - Chronic left-sided low back pain without sciatica M25.552 (ICD-10-CM) - Pain in left hip M79.18 (ICD-10-CM) - Myofascial pain syndrome  Rationale for Evaluation and Treatment: Rehabilitation  THERAPY DIAG:  Other low back pain  ONSET DATE: 04/15/23, date the pain started  SUBJECTIVE:                                                                                                                                                                                            SUBJECTIVE STATEMENT: Pain started after dancing at child's birthday party on 04/15/2023. Pain started her left leg initially but now it is localized to her left low back and around SIJ. Pain limits her activity  PERTINENT HISTORY:  No significant PMH regarding her back  PAIN:  NPRS scale: 5/10 upon arrival Pain location:left low back Pain description: nagging, throbing Aggravating factors: too much movement,  Relieving factors: laying down sometimes  PRECAUTIONS: None  RED FLAGS: None   WEIGHT BEARING RESTRICTIONS: No  FALLS:  Has patient fallen in last 6 months? No  OCCUPATION: work from home  PLOF: Independent  PATIENT GOALS: reduce pain, help her figure out what exercises to do at house  NEXT MD VISIT:   OBJECTIVE:   DIAGNOSTIC FINDINGS:  XR IMPRESSION: "Unremarkable radiographic appearance of the lumbar spine."  PATIENT SURVEYS:  Eval: FOTO 60% functional, goal is 70%  SCREENING FOR RED FLAGS: Bowel or bladder incontinence: No COGNITION: Overall cognitive status: Within functional limits for tasks assessed     MUSCLE LENGTH: Hamstrings: mild tightness into her hamstrings bilat  POSTURE: Eval: Slight Rt lateral trunk shift  PALPATION: Eval: TTP over left SIJ and lumbar P.S.  LUMBAR ROM:   AROM eval  Flexion 75% and pain, pain improved with repeated flexion in sitting  Extension 50% and pain, pain increased with repeated extensions  Right lateral flexion   Left lateral flexion   Right rotation 75%  Left rotation 75%   (Blank rows = not tested)  LOWER EXTREMITY ROM:   WFL at eval   LOWER EXTREMITY MMT:  WFL at eval    LUMBAR SPECIAL TESTS:  Straight leg raise test: Negative and Slump test: Positive   GAIT: Eval Comments: WFL  TODAY'S TREATMENT:  Eval HEP creation and review with demonstration and trial set preformed,  see below for details IFC electrical stimulation X 12 min in prone to lumbar with moist heat PATIENT EDUCATION: Education details: HEP, PT plan of care, home TENS education Person educated: Patient Education method: Explanation, Demonstration, Verbal cues, and Handouts Education comprehension: verbalized understanding and needs further education   HOME EXERCISE PROGRAM: Access Code: [email or web address] URL: [email or web address] Date: 05/10/2023 Prepared by: Sarah Fritz  Exercises - Right Standing Lateral Shift Correction at Wall - Repetitions  - 2 x daily - 6 x weekly - 1-3 sets - 10 reps - Seated Lumbar Flexion Stretch  - 2 x daily - 6 x weekly - 1 sets - 10 reps - 10 hold - Seated Slump Nerve Glide (Mirrored)  - 2 x daily - 6 x weekly - 1 sets - 10 reps - 3 hold - Supine Lower Trunk Rotation  - 2 x daily - 6 x weekly - 1 sets - 10 reps - 5 sec hold - Supine Bridge  - 2 x daily - 6 x weekly - 1-2 sets - 10 reps - 5 hold - Supine Single Knee to Chest Stretch  - 2 x daily - 6 x weekly - 1 sets - 2 reps - 3- hold - Dead Bug  - 2 x daily - 6 x weekly - 1 sets - 10 reps  Patient Education - TENS UNIT - AUVON Dual Channel TENS Unit - TENS Therapy  ASSESSMENT:  CLINICAL IMPRESSION: Patient referred to PT for Left lower back pain, and myofascial pain syndrome. NP recommending to consider manual treatments, core strengthening, posture and dry needling. Based on location of pain she may also be dealing with SIJ dysfunction. She had preference to lumbar flexion based exercises vs extension based today. We did try Estim which seemed to help today at least in the moment so I did print out education for home TENS unit. Patient will benefit from skilled PT to address below impairments, limitations and improve overall function.  OBJECTIVE IMPAIRMENTS: decreased activity tolerance, difficulty walking, decreased balance, decreased endurance, decreased mobility, decreased ROM, decreased strength,  impaired  flexibility, impaired LE use, postural dysfunction, and pain.  ACTIVITY LIMITATIONS: bending, lifting, carry, locomotion, cleaning, community activity, driving, and or occupation  PERSONAL FACTORS: none  REHAB POTENTIAL: Good  CLINICAL DECISION MAKING: Stable/uncomplicated  EVALUATION COMPLEXITY: Low    GOALS: Short term PT Goals Target date: 06/07/2023   Pt will be I and compliant with HEP. Baseline:  Goal status: New Pt will decrease pain by 25% overall Baseline: Goal status: New  Long term PT goals Target date:06/21/2023   Pt will improve ROM to [facility] to improve functional mobility Baseline: Goal status: New Pt will improve FOTO to at least 70% functional to show improved function Baseline: Goal status: New Pt will reduce pain by overall 50% overall with usual activity Baseline: Goal status: New  PLAN: PT FREQUENCY: 1-2 times per week   PT DURATION: 4-6 weeks  PLANNED INTERVENTIONS (unless contraindicated): aquatic PT, Canalith repositioning, cryotherapy, Electrical stimulation, Iontophoresis with 4 mg/ml dexamethasome, Moist heat, traction, Ultrasound, gait training, Therapeutic exercise, balance training, neuromuscular re-education, patient/family education, prosthetic training, manual techniques, passive ROM, dry needling, taping, vasopnuematic device, vestibular, spinal manipulations, joint manipulations  PLAN FOR NEXT SESSION: how was TENS and repeat if desired, consider lumbar mobs and or DN, review HEP    Sarah Fritz, PT,DPT 05/10/2023, 3:33 PM   PHYSICAL THERAPY DISCHARGE SUMMARY  Visits from Start of Care: 1  Current functional level related to goals / functional outcomes: See note   Remaining deficits: See note   Education / Equipment: HEP  Patient goals were not met. Patient is being discharged due to not returning since the last visit.   Sarah Fritz, PT, DPT, OCS, ATC 08/10/23  1:15 PM

## 2023-05-11 NOTE — Telephone Encounter (Signed)
Pt called stating her FMLA forms was fax due to only putting one date on the forms and her PT needing to be added as well. Pt ask for the nurse to give her a call. Please advise.

## 2023-05-26 ENCOUNTER — Other Ambulatory Visit: Payer: Self-pay | Admitting: Physical Medicine and Rehabilitation

## 2023-05-30 ENCOUNTER — Other Ambulatory Visit (HOSPITAL_COMMUNITY): Payer: Self-pay

## 2023-05-31 ENCOUNTER — Telehealth: Payer: Self-pay | Admitting: Pharmacy Technician

## 2023-05-31 ENCOUNTER — Other Ambulatory Visit (HOSPITAL_COMMUNITY): Payer: Self-pay

## 2023-05-31 NOTE — Telephone Encounter (Signed)
Pharmacy Patient Advocate Encounter   Received notification from CoverMyMeds that prior authorization for ACCRUFeR 30MG  capsules is required/requested.   Insurance verification completed.   The patient is insured through [facility] .   Per test claim:  OTC Iron is preferred by the insurance.  If suggested medication is appropriate, Please send in a new RX and discontinue this one. If not, please advise as to why it's not appropriate so that we may request a Prior Authorization.

## 2023-05-31 NOTE — Telephone Encounter (Signed)
She should then try over-the-counter iron supplements first.

## 2023-06-01 ENCOUNTER — Encounter: Payer: Self-pay | Admitting: Emergency Medicine

## 2023-06-01 NOTE — Telephone Encounter (Signed)
Called PT to reach back out about medication. LVM

## 2023-06-23 ENCOUNTER — Ambulatory Visit
Admission: EM | Admit: 2023-06-23 | Discharge: 2023-06-23 | Disposition: A | Discharge status: Home / Self Care | Payer: 59 | Attending: Internal Medicine | Admitting: Internal Medicine

## 2023-06-23 DIAGNOSIS — J329 Chronic sinusitis, unspecified: Secondary | ICD-10-CM

## 2023-06-23 DIAGNOSIS — J40 Bronchitis, not specified as acute or chronic: Secondary | ICD-10-CM | POA: Diagnosis not present

## 2023-06-23 MED ORDER — PROMETHAZINE-DM 6.25-15 MG/5ML PO SYRP: 5.0000 mL | ORAL_SOLUTION | Freq: Three times a day (TID) | ORAL | 0 refills | Status: DC | PRN

## 2023-06-23 MED ORDER — PREDNISONE 20 MG PO TABS: ORAL_TABLET | ORAL | 0 refills | Status: DC

## 2023-06-23 MED ORDER — CETIRIZINE HCL 10 MG PO TABS: 10.0000 mg | ORAL_TABLET | Freq: Every day | ORAL | 0 refills | Status: DC

## 2023-06-23 MED ORDER — AMOXICILLIN 875 MG PO TABS: 875.0000 mg | ORAL_TABLET | Freq: Two times a day (BID) | ORAL | 0 refills | Status: DC

## 2023-06-23 NOTE — ED Triage Notes (Signed)
Pt c/o cough, head/chest congestion, sore throat x 1 week-NAD-steady gait

## 2023-06-23 NOTE — Discharge Instructions (Signed)
We will manage this as a sinus infection, bronchitis, sinobronchitis with amoxicillin and prednisone.  For sore throat or cough try using a honey-based tea. Use 3 teaspoons of honey with juice squeezed from half lemon. Place shaved pieces of ginger into 1/2-1 cup of water and warm over stove top. Then mix the ingredients and repeat every 4 hours as needed. Please take Tylenol 650mg  every 6 hours for throat pain, fevers, aches and pains. Hydrate very well with at least 2 liters of water. Eat light meals such as soups (chicken and noodles, vegetable, chicken and wild rice).  Do not eat foods that you are allergic to.  Taking an antihistamine like Zyrtec can help against postnasal drainage, sinus congestion which can cause sinus pain, sinus headaches, throat pain, painful swallowing, coughing.  You can take this together with prednisone.  Use cough medication as needed.

## 2023-06-23 NOTE — ED Provider Notes (Signed)
[facility]  Note:  This document was prepared using Conservation officer, historic buildings and may include unintentional dictation errors.  MRN: [id number] DOB: 1981/07/02  Subjective:   Sarah Fritz is a 42 y.o. female presenting for 8 day history of acute onset persistent sinus congestion, sinus drainage, throat pain, post-nasal drainage, chest congestion, productive cough that elicits chest pain. Smokes 1/2ppd.   No current facility-administered medications for this encounter.  Current Outpatient Medications:    cyclobenzaprine (FLEXERIL) 10 MG tablet, TAKE 1 TABLET(10 MG) BY MOUTH AT BEDTIME AS NEEDED FOR MUSCLE SPASMS, Disp: 30 tablet, Rfl: 0   Ferric Maltol (ACCRUFER) 30 MG CAPS, Take 1 capsule (30 mg total) by mouth 2 (two) times daily., Disp: 60 capsule, Rfl: 1   levothyroxine (SYNTHROID) 50 MCG tablet, Take 1 tablet (50 mcg total) by mouth daily before breakfast., Disp: 90 tablet, Rfl: 3   meloxicam (MOBIC) 15 MG tablet, TAKE 1 TABLET(15 MG) BY MOUTH DAILY, Disp: 30 tablet, Rfl: 0   valsartan (DIOVAN) 40 MG tablet, Take 1 tablet (40 mg total) by mouth daily., Disp: 90 tablet, Rfl: 3   Vitamin D, Ergocalciferol, (DRISDOL) 1.25 MG (50000 UNIT) CAPS capsule, Take 1 capsule by mouth once a week., Disp: , Rfl:    Allergies  Allergen Reactions   Percocet [Oxycodone-Acetaminophen] Nausea And Vomiting and Other (See Comments)    Sweating, pass out   Vicodin [Hydrocodone-Acetaminophen] Nausea And Vomiting and Other (See Comments)    Sweating, pass out    Past Medical History:  Diagnosis Date   Abscess    Anemia    Chlamydia    Depression    Gonorrhea    Graves disease    Hypertension      Past Surgical History:  Procedure Laterality Date   WISDOM TOOTH EXTRACTION      Family History  Problem Relation Age of Onset   Heart disease Mother    Kidney disease Mother    COPD Mother    Cancer Mother    Cancer Father    COPD Father     Social  History   Tobacco Use   Smoking status: Every Day    Current packs/day: 0.50    Types: Cigarettes   Smokeless tobacco: Current  Vaping Use   Vaping status: Former  Substance Use Topics   Alcohol use: Yes    Comment: occasional   Drug use: Not Currently    Types: Marijuana    ROS   Objective:   Vitals: BP (!) 148/84 (BP Location: Left Arm)   Pulse 91   Temp 98.4 F (36.9 C) (Oral)   Resp 20   LMP 06/16/2023   SpO2 98%   Physical Exam Constitutional:      General: She is not in acute distress.    Appearance: Normal appearance. She is well-developed and normal weight. She is not ill-appearing, toxic-appearing or diaphoretic.  HENT:     Head: Normocephalic and atraumatic.     Right Ear: Tympanic membrane, ear canal and external ear normal. No drainage or tenderness. No middle ear effusion. There is no impacted cerumen. Tympanic membrane is not erythematous or bulging.     Left Ear: Tympanic membrane, ear canal and external ear normal. No drainage or tenderness.  No middle ear effusion. There is no impacted cerumen. Tympanic membrane is not erythematous or bulging.     Nose: Congestion present. No rhinorrhea.     Mouth/Throat:     Mouth: Mucous membranes  are moist. No oral lesions.     Pharynx: No pharyngeal swelling, oropharyngeal exudate, posterior oropharyngeal erythema or uvula swelling.     Tonsils: No tonsillar exudate or tonsillar abscesses.     Comments: Significant post-nasal drainage overlying pharynx. Eyes:     General: No scleral icterus.       Right eye: No discharge.        Left eye: No discharge.     Extraocular Movements: Extraocular movements intact.     Right eye: Normal extraocular motion.     Left eye: Normal extraocular motion.     Conjunctiva/sclera: Conjunctivae normal.  Cardiovascular:     Rate and Rhythm: Normal rate and regular rhythm.     Heart sounds: Normal heart sounds. No murmur heard.    No friction rub. No gallop.  Pulmonary:      Effort: Pulmonary effort is normal. No respiratory distress.     Breath sounds: No stridor. Rhonchi (mild over mid lung fields bilaterally) present. No wheezing or rales.  Chest:     Chest wall: No tenderness.  Musculoskeletal:     Cervical back: Normal range of motion and neck supple.  Lymphadenopathy:     Cervical: No cervical adenopathy.  Skin:    General: Skin is warm and dry.  Neurological:     General: No focal deficit present.     Mental Status: She is alert and oriented to person, place, and time.  Psychiatric:        Mood and Affect: Mood normal.        Behavior: Behavior normal.     Assessment and Plan :   PDMP not reviewed this encounter.  1. Sinobronchitis     Will manage for sinobronchitis with amoxicillin and prednisone. Use supportive care otherwise. Will defer imaging for now. Counseled patient on potential for adverse effects with medications prescribed/recommended today, ER and return-to-clinic precautions discussed, patient verbalized understanding.    Wallis Bamberg, PA-C 06/23/23 1714

## 2023-06-30 ENCOUNTER — Other Ambulatory Visit: Payer: Self-pay | Admitting: Physical Medicine and Rehabilitation

## 2023-07-15 ENCOUNTER — Other Ambulatory Visit: Payer: Self-pay | Admitting: Emergency Medicine

## 2023-07-15 DIAGNOSIS — I10 Essential (primary) hypertension: Secondary | ICD-10-CM

## 2023-07-16 ENCOUNTER — Encounter: Payer: Self-pay | Admitting: Emergency Medicine

## 2023-07-17 ENCOUNTER — Other Ambulatory Visit: Payer: Self-pay | Admitting: Physical Medicine and Rehabilitation

## 2023-07-17 MED ORDER — CYCLOBENZAPRINE HCL 10 MG PO TABS: 10.0000 mg | ORAL_TABLET | Freq: Every day | ORAL | 0 refills | Status: DC

## 2023-08-07 ENCOUNTER — Encounter: Payer: Self-pay | Admitting: Emergency Medicine

## 2023-08-10 IMAGING — CT CT HEAD W/O CM
4 series · 16 of 47 positions shown, 18 images · non-contrast
Comparison: 05/23/2016

CLINICAL DATA: Syncope or presyncope cerebrovascular cause
suspected



[Series 4: head 5.00 hr40 s3 axial ibhc · axial · 0.41mm/px · z∈[-626,-506]mm · 7 of 33 slices shown, 9 images]
[im 5/33  brain]
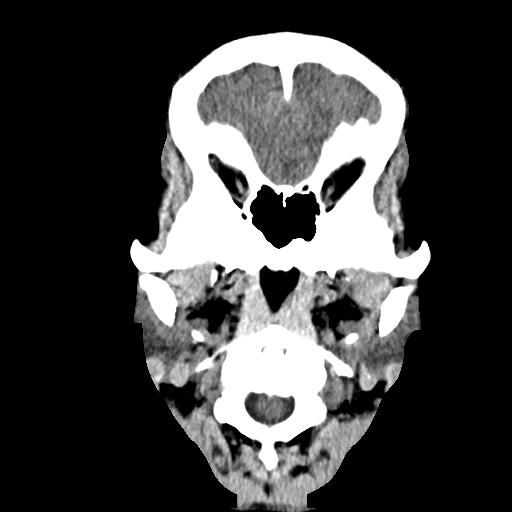
[im 5/33  bone]
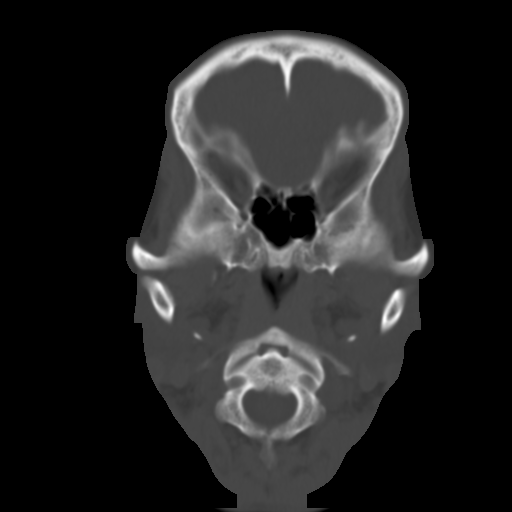
[im 9/33  brain]
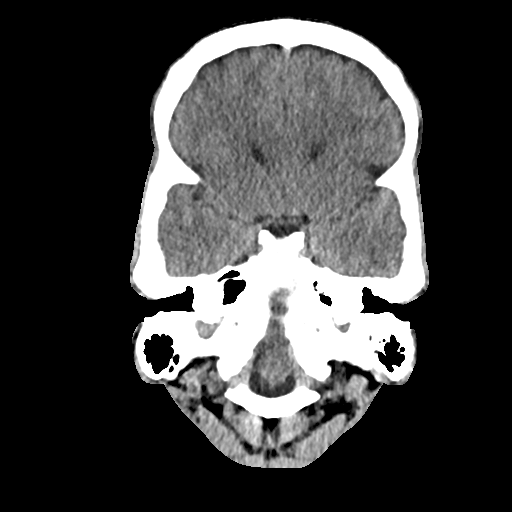
[im 13/33  brain]
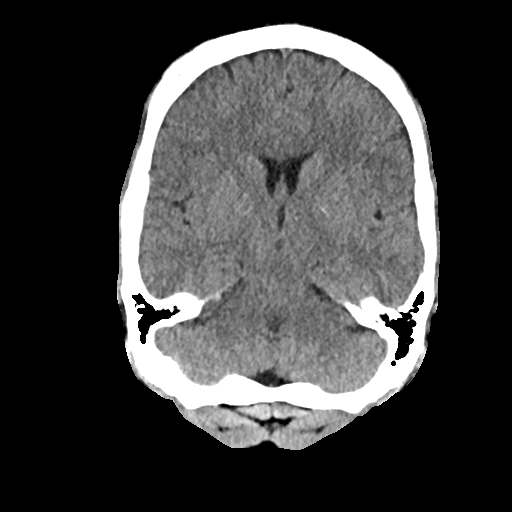
[im 17/33  brain]
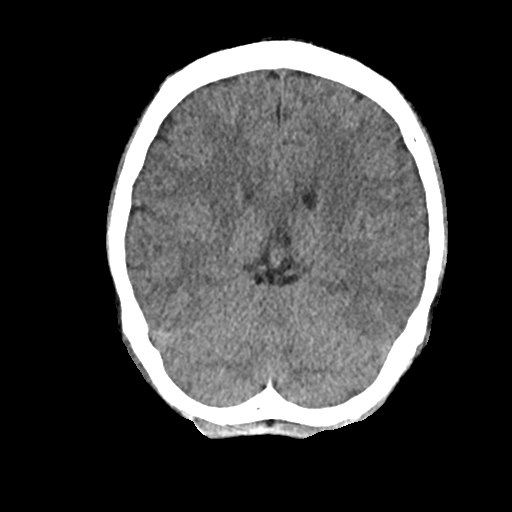
[im 21/33  brain]
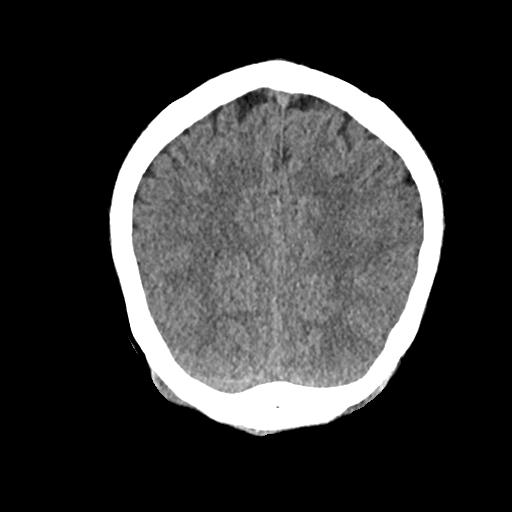
[im 21/33  bone]
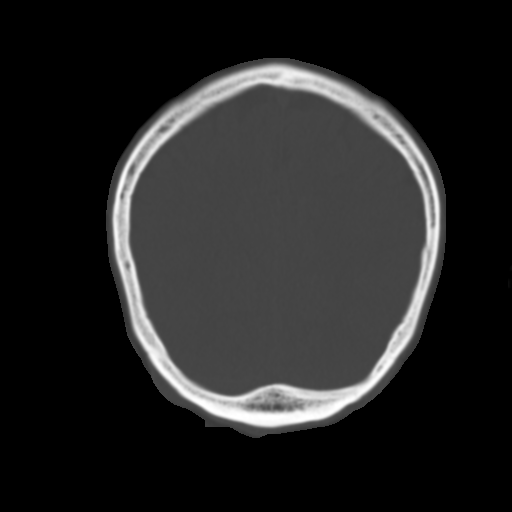
[im 25/33  brain]
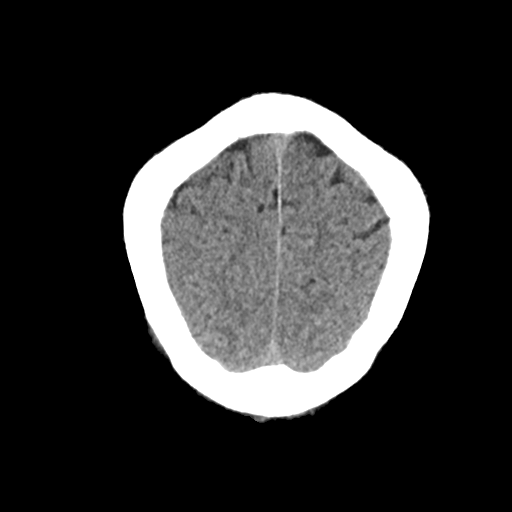
[im 29/33  brain]
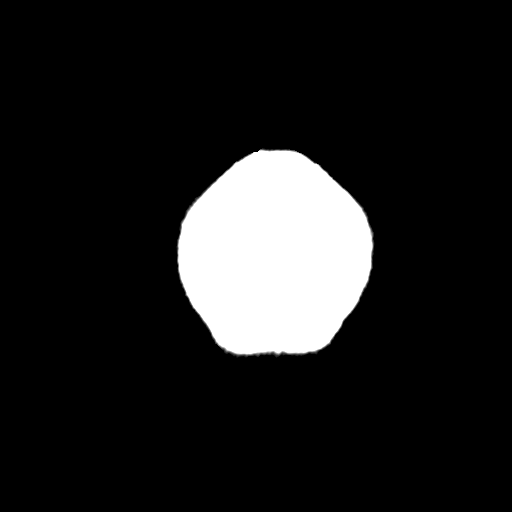

[Series 5: head 2.00 hr60 s3 axial bone · axial · 0.41mm/px · z∈[-631,-599]mm · 3 of 83 slices shown]
[im 9/83  bone]
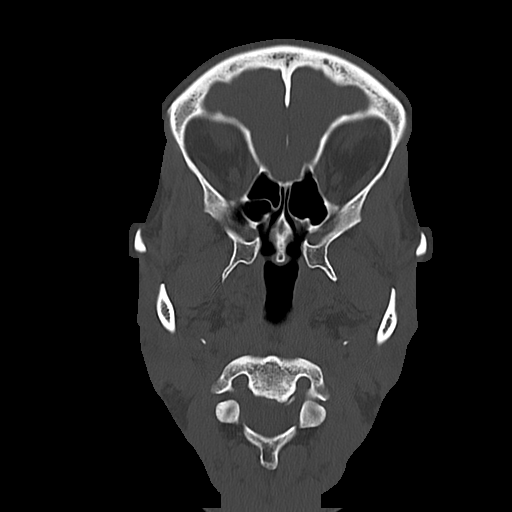
[im 17/83  bone]
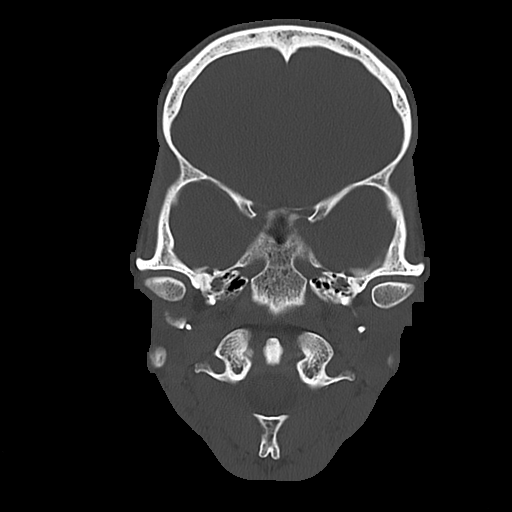
[im 25/83  bone]
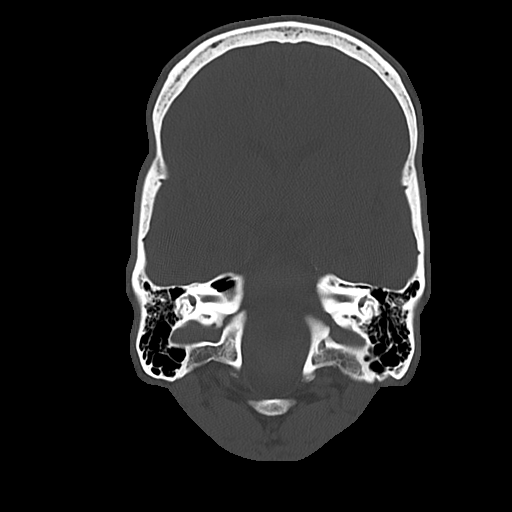

[Series 6: head 3.00 hr40 s3 sag · sagittal · 0.31mm/px · 3 of 53 slices shown]
[im 18/53  brain]
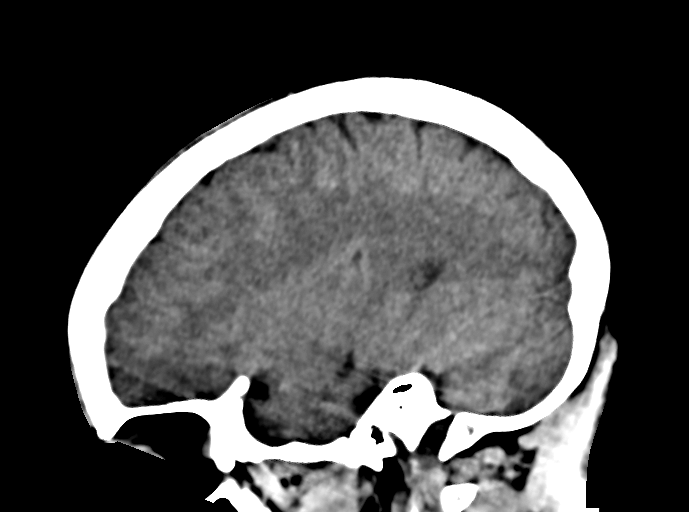
[im 27/53  brain]
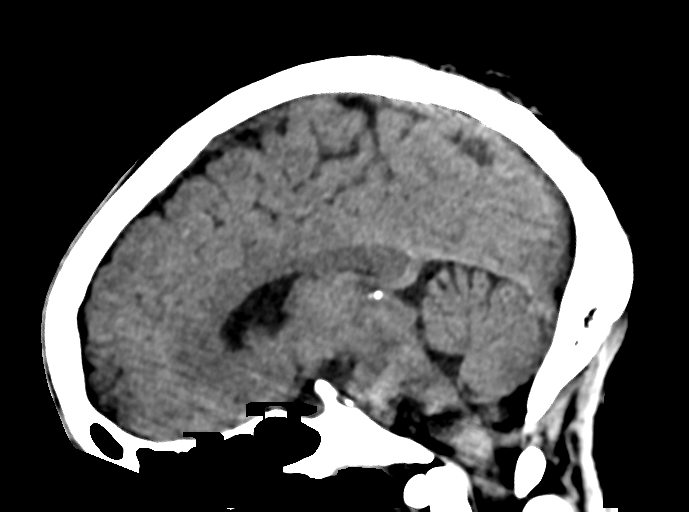
[im 35/53  brain]
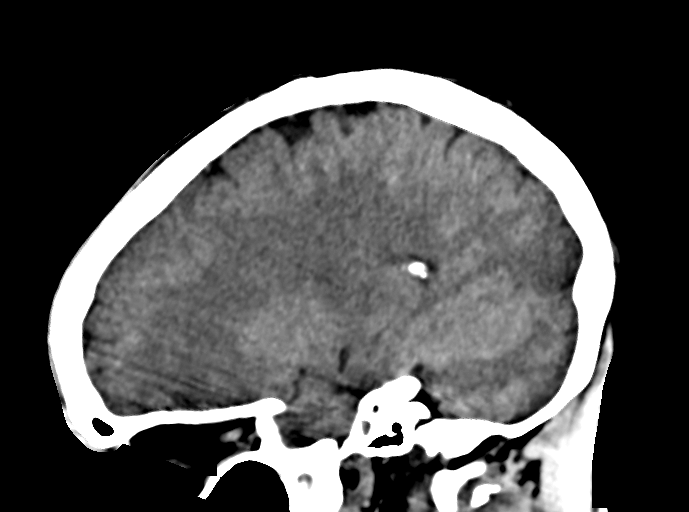

[Series 8: head 3.00 hr40 s3 cor · coronal · 0.31mm/px · 3 of 71 slices shown]
[im 24/71  brain]
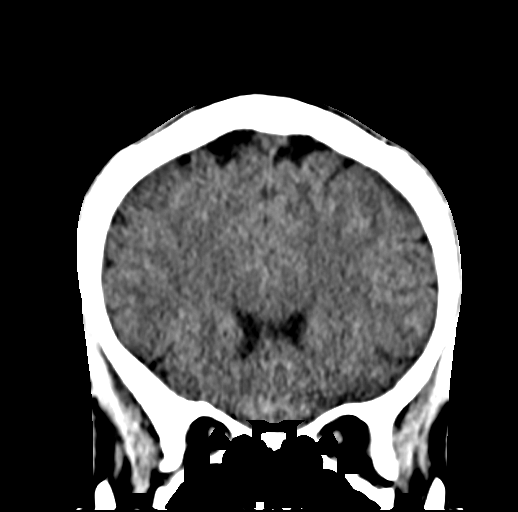
[im 32/71  brain]
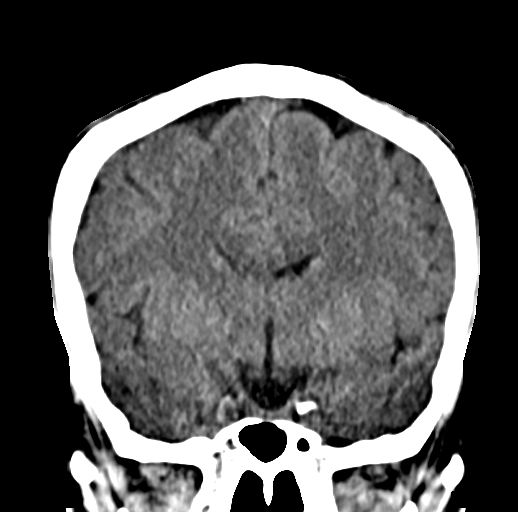
[im 39/71  brain]
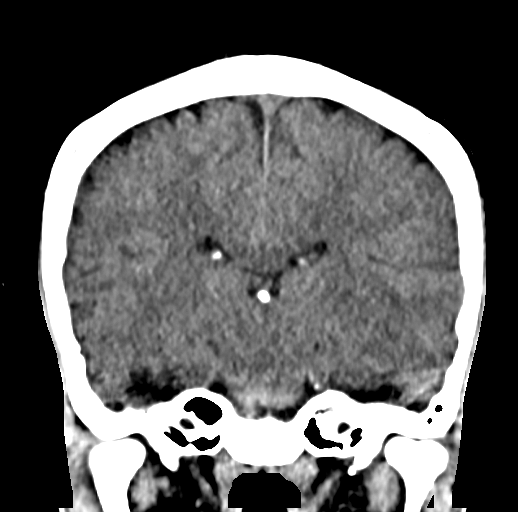

[16 of 47 positions shown; findings below may reference images not displayed]

FINDINGS: Brain: No evidence of acute infarction, hemorrhage, cerebral edema,
mass, mass effect, or midline shift. No hydrocephalus or extra-axial
fluid collection.

Vascular: No hyperdense vessel.

Skull: Normal. Negative for fracture or focal lesion.

Sinuses/Orbits: No acute finding.

Other: The mastoid air cells are well aerated.
IMPRESSION: IMPRESSION
No acute intracranial process. No etiology seen for the patient's
syncope.

## 2023-08-11 NOTE — Telephone Encounter (Signed)
FMLA placed in provider office to be signed

## 2023-08-15 ENCOUNTER — Other Ambulatory Visit: Payer: Self-pay | Admitting: Physical Medicine and Rehabilitation

## 2023-08-15 DIAGNOSIS — M7918 Myalgia, other site: Secondary | ICD-10-CM

## 2023-08-15 DIAGNOSIS — G8929 Other chronic pain: Secondary | ICD-10-CM

## 2023-09-18 ENCOUNTER — Other Ambulatory Visit: Payer: Self-pay | Admitting: Physical Medicine and Rehabilitation

## 2023-09-18 MED ORDER — CYCLOBENZAPRINE HCL 10 MG PO TABS: 10.0000 mg | ORAL_TABLET | Freq: Every day | ORAL | 0 refills | Status: DC

## 2023-09-28 ENCOUNTER — Encounter: Payer: Self-pay | Admitting: Emergency Medicine

## 2023-09-29 NOTE — Telephone Encounter (Signed)
Forms completed. Informed patient Dr. Alvy Bimler will be back Monday to sign forms then it will be faxed

## 2023-11-08 ENCOUNTER — Other Ambulatory Visit: Payer: Self-pay | Admitting: Physical Medicine and Rehabilitation

## 2023-11-23 ENCOUNTER — Encounter: Payer: Self-pay | Admitting: Emergency Medicine

## 2023-11-27 ENCOUNTER — Ambulatory Visit (INDEPENDENT_AMBULATORY_CARE_PROVIDER_SITE_OTHER): Admitting: Emergency Medicine

## 2023-11-27 VITALS — BP 142/78 | HR 90 | Temp 98.7°F | Ht 64.0 in | Wt 106.1 lb

## 2023-11-27 DIAGNOSIS — E0500 Thyrotoxicosis with diffuse goiter without thyrotoxic crisis or storm: Secondary | ICD-10-CM

## 2023-11-27 DIAGNOSIS — J302 Other seasonal allergic rhinitis: Secondary | ICD-10-CM

## 2023-11-27 DIAGNOSIS — E890 Postprocedural hypothyroidism: Secondary | ICD-10-CM

## 2023-11-27 DIAGNOSIS — F17200 Nicotine dependence, unspecified, uncomplicated: Secondary | ICD-10-CM

## 2023-11-27 DIAGNOSIS — I10 Essential (primary) hypertension: Secondary | ICD-10-CM

## 2023-11-27 DIAGNOSIS — E559 Vitamin D deficiency, unspecified: Secondary | ICD-10-CM | POA: Diagnosis not present

## 2023-11-27 DIAGNOSIS — F331 Major depressive disorder, recurrent, moderate: Secondary | ICD-10-CM | POA: Diagnosis not present

## 2023-11-27 DIAGNOSIS — T7840XA Allergy, unspecified, initial encounter: Secondary | ICD-10-CM

## 2023-11-27 LAB — HEMOGLOBIN A1C: Hgb A1c MFr Bld: 5.7 % (ref 4.6–6.5)

## 2023-11-27 LAB — COMPREHENSIVE METABOLIC PANEL WITH GFR
ALT: 7 U/L (ref 0–35)
AST: 12 U/L (ref 0–37)
Albumin: 4.4 g/dL (ref 3.5–5.2)
Alkaline Phosphatase: 43 U/L (ref 39–117)
BUN: 12 mg/dL (ref 6–23)
CO2: 26 meq/L (ref 19–32)
Calcium: 9.1 mg/dL (ref 8.4–10.5)
Chloride: 107 meq/L (ref 96–112)
Creatinine, Ser: 0.85 mg/dL (ref 0.40–1.20)
GFR: 83.92 mL/min (ref >60.00)
Glucose, Bld: 91 mg/dL (ref 70–99)
Potassium: 4.2 meq/L (ref 3.5–5.1)
Sodium: 139 meq/L (ref 135–145)
Total Bilirubin: 0.8 mg/dL (ref 0.2–1.2)
Total Protein: 6.9 g/dL (ref 6.0–8.3)

## 2023-11-27 LAB — CBC WITH DIFFERENTIAL/PLATELET
Basophils Absolute: 0.1 10*3/uL (ref 0.0–0.1)
Basophils Relative: 1.2 % (ref 0.0–3.0)
Eosinophils Absolute: 0.4 10*3/uL (ref 0.0–0.7)
Eosinophils Relative: 7.9 % — ABNORMAL HIGH (ref 0.0–5.0)
HCT: 32.7 % — ABNORMAL LOW (ref 36.0–46.0)
Hemoglobin: 10.7 g/dL — ABNORMAL LOW (ref 12.0–15.0)
Lymphocytes Relative: 29.1 % (ref 12.0–46.0)
Lymphs Abs: 1.6 10*3/uL (ref 0.7–4.0)
MCHC: 32.7 g/dL (ref 30.0–36.0)
MCV: 75.9 fl — ABNORMAL LOW (ref 78.0–100.0)
Monocytes Absolute: 0.5 10*3/uL (ref 0.1–1.0)
Monocytes Relative: 9.6 % (ref 3.0–12.0)
Neutro Abs: 2.9 10*3/uL (ref 1.4–7.7)
Neutrophils Relative %: 52.2 % (ref 43.0–77.0)
Platelets: 273 10*3/uL (ref 150.0–400.0)
RBC: 4.31 Mil/uL (ref 3.87–5.11)
RDW: 17.4 % — ABNORMAL HIGH (ref 11.5–15.5)
WBC: 5.6 10*3/uL (ref 4.0–10.5)

## 2023-11-27 LAB — TSH: TSH: 0.63 u[IU]/mL (ref 0.35–5.50)

## 2023-11-27 LAB — VITAMIN B12: Vitamin B-12: 242 pg/mL (ref 211–911)

## 2023-11-27 LAB — VITAMIN D 25 HYDROXY (VIT D DEFICIENCY, FRACTURES): VITD: 29.42 ng/mL — ABNORMAL LOW (ref 30.00–100.00)

## 2023-11-27 MED ORDER — VORTIOXETINE HBR 5 MG PO TABS
5.0000 mg | ORAL_TABLET | Freq: Every day | ORAL | 5 refills | Status: DC
Start: 2023-11-27 — End: 2023-11-30

## 2023-11-27 MED ORDER — LEVOCETIRIZINE DIHYDROCHLORIDE 5 MG PO TABS: 5.0000 mg | ORAL_TABLET | Freq: Every evening | ORAL | 1 refills | Status: DC

## 2023-11-27 NOTE — Assessment & Plan Note (Signed)
Cardiovascular and cancer risks associated with smoking discussed. Smoking cessation advice given. States she is smoking less.

## 2023-11-27 NOTE — Patient Instructions (Signed)
 Major Depressive Disorder, Adult Major depressive disorder (MDD) is a mental health condition. People with this disorder feel very sad, hopeless, and lose interest in things. Symptoms last most of the day, almost every day, for 2 weeks. MDD can affect: Relationships. Work and school. Things you usually like to do. What are the causes? The cause of MDD is not known. What increases the risk? Having family members with depression. Being female. Family problems. Alcohol or drug misuse. A lot of stress in your life, such as from: Living without basic needs such as food and housing. Being treated poorly because of race, sex, or religion (discrimination). Things that caused you pain as a child, especially if you lost a parent or were abused. Health and mental problems that you have had for a long time. What are the signs or symptoms? The main symptoms of this condition are: Being sad all the time. Being grouchy (irritable) all the time. Not enjoying the things you usually like. Sleeping too much or too little. Eating too much or too little. Feeling tired. Other symptoms include: Gaining or losing weight, without knowing why. Being restless and weak. Feeling hopeless, worthless, or guilty. Trouble thinking or making decisions. Thoughts of hurting yourself or others, or thoughts of ending your life. Spending a lot of time alone. Being unable to do daily tasks. If you have very bad MDD, you may: Believe things that are not true. Hear, see, taste, or feel things that are not there. Have mild depression that lasts for at least 2 years. Feel very sad and hopeless. Have trouble speaking or moving. Feel very sad during some seasons. How is this treated? Talk therapy. This teaches you about thoughts, feelings, and actions and how to change them. This can also help you to talk with others. This can be done with members of your family. Medicines. Lifestyle changes. You may need to: Limit  alcohol use. Stop using drugs, if you use them. Exercise. Get plenty of sleep. Eat healthy. Spend more time outdoors. Brain stimulation. This may be done when symptoms are very bad or have not gotten better. Follow these instructions at home: Alcohol use Do not drink alcohol if: Your health care provider tells you not to drink. You are pregnant, may be pregnant, or are planning to become pregnant. If you drink alcohol: Limit how much you use to: 0-1 drink a day for women. 0-2 drinks a day for men. Know how much alcohol is in your drink. In the [place] one drink equals one 12 oz bottle of beer (355 mL), one 5 oz glass of wine (148 mL), or one 1 oz glass of hard liquor (44 mL). Activity Exercise as told by your doctor. Spend time outdoors. Make time to do the things you enjoy. Find ways to deal with stress. Try to: Meditate. Do deep breathing. Spend time in nature. Keep a journal. Return to your normal activities when your doctor says that it is safe. General instructions  Take over-the-counter and prescription medicines only as told by your doctor. Talk to your doctor about: Alcohol use. It can affect medicines. Any drug use. Eat healthy foods. Get a lot of sleep. Think about joining a support group. Ask your doctor about that. Keep all follow-up visits. Your doctor will need to check on your mood, behavior, and medicines, and change your treatment as needed. Where to find more information: [employer] on Mental Illness: [employer]ami[employer] of Mental Health: BloggerCourse.com American Psychiatric Association: psychiatry.org Contact a doctor  if: You feel worse. You get new symptoms. Get help right away if: You hurt yourself on purpose (self-harm). You have thoughts about hurting yourself or others. You see, hear, taste, smell, or feel things that are not there. Get help right away if you feel like you may hurt yourself or others, or have thoughts about taking  your own life. Go to your nearest emergency room or: Call 911. Call the National Suicide Prevention Lifeline at (513) 004-4926 or 988. This is open 24 hours a day. Text the Crisis Text Line at (314) 815-6587. This information is not intended to replace advice given to you by your health care provider. Make sure you discuss any questions you have with your health care provider. Document Revised: 12/07/2021 Document Reviewed: 12/07/2021 Elsevier Patient Education  2024 [employer].

## 2023-11-27 NOTE — Progress Notes (Signed)
 Sarah Fritz 43 y.o.   Chief Complaint  Patient presents with   Anxiety    Having anxiety and depression (feels like anxiety and depression has gotten worse, used to be on medication). Also experiencing allergies ( have used over the counter meds Claritin, been having this issue since the beging of this year)    HISTORY OF PRESENT ILLNESS: This is a 43 y.o. female with history of recurrent depression complaining of anxiety and depression for the past several weeks.  Slowly getting worse. Needs referral to psychiatry.  Presently off medications. Also complaining of chronic allergies.  Over-the-counter medications not helping. No other complaints or medical concerns today.  Anxiety Patient reports no chest pain, dizziness, nausea, palpitations or shortness of breath.       Prior to Admission medications   Medication Sig Start Date End Date Taking? Authorizing Provider  cyclobenzaprine (FLEXERIL) 10 MG tablet Take 1 tablet (10 mg total) by mouth at bedtime. 09/18/23  Yes Tyrell Antonio, MD  levocetirizine (XYZAL) 5 MG tablet Take 1 tablet (5 mg total) by mouth every evening. 11/27/23  Yes Demond Shallenberger, Eilleen Kempf, MD  levothyroxine (SYNTHROID) 50 MCG tablet Take 1 tablet (50 mcg total) by mouth daily before breakfast. 06/22/22  Yes Malea Swilling, Eilleen Kempf, MD  valsartan (DIOVAN) 40 MG tablet TAKE 1 TABLET(40 MG) BY MOUTH DAILY 07/16/23  Yes Kaevion Sinclair, Eilleen Kempf, MD  Vitamin D, Ergocalciferol, (DRISDOL) 1.25 MG (50000 UNIT) CAPS capsule Take 1 capsule by mouth once a week. 04/21/23  Yes [provider]  vortioxetine HBr (TRINTELLIX) 5 MG TABS tablet Take 1 tablet (5 mg total) by mouth daily. 11/27/23  Yes Georgina Quint, MD    Allergies  Allergen Reactions   Percocet [Oxycodone-Acetaminophen] Nausea And Vomiting and Other (See Comments)    Sweating, pass out   Vicodin [Hydrocodone-Acetaminophen] Nausea And Vomiting and Other (See Comments)    Sweating, pass out     Patient Active Problem List   Diagnosis Date Noted   Sciatica, left side 04/27/2023   Vitamin D deficiency 04/27/2023   Leg cramps 04/20/2023   Breast tenderness 10/25/2022   Stress and adjustment reaction 10/25/2022   Mass of upper inner quadrant of left breast 10/25/2022   Family history of colon cancer in father 10/25/2022   Essential hypertension 06/22/2022   Anemia 03/09/2022   Constipation 03/09/2022   Heavy menses 03/07/2022   Hypertension 03/01/2022   Daily headache 11/17/2021   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   Graves disease 07/27/2016   Hyperthyroidism 07/14/2016   Chronic pain syndrome 07/14/2016   Smoker 07/14/2016   Recurrent UTI 04/18/2013    Past Medical History:  Diagnosis Date   Abscess    Anemia    Chlamydia    Depression    Gonorrhea    Graves disease    Hypertension     Past Surgical History:  Procedure Laterality Date   WISDOM TOOTH EXTRACTION      Social History   Socioeconomic History   Marital status: Single    Spouse name: Not on file   Number of children: Not on file   Years of education: Not on file   Highest education level: Associate degree: occupational, Scientist, product/process development, or vocational program  Occupational History   Not on file  Tobacco Use   Smoking status: Every Day    Current packs/day: 0.50    Types: Cigarettes   Smokeless tobacco: Current  Vaping Use   Vaping status: Former  Substance and Sexual Activity  Alcohol use: Yes    Comment: occasional   Drug use: Not Currently    Types: Marijuana   Sexual activity: Yes    Birth control/protection: None  Other Topics Concern   Not on file  Social History Narrative   Not on file   Social Drivers of Health   Financial Resource Strain: Low Risk  (11/27/2023)   Overall Financial Resource Strain (CARDIA)    Difficulty of Paying Living Expenses: Not hard at all  Food Insecurity: Food Insecurity Present (11/27/2023)   Hunger Vital Sign    Worried About Running Out  of Food in the Last Year: Sometimes true    Ran Out of Food in the Last Year: Never true  Transportation Needs: No Transportation Needs (11/27/2023)   PRAPARE - Administrator, Civil Service (Medical): No    Lack of Transportation (Non-Medical): No  Physical Activity: Insufficiently Active (11/27/2023)   Exercise Vital Sign    Days of Exercise per Week: 1 day    Minutes of Exercise per Session: 30 min  Stress: Stress Concern Present (11/27/2023)   [employer] of Occupational Health - Occupational Stress Questionnaire    Feeling of Stress : Very much  Social Connections: Moderately Isolated (11/27/2023)   Social Connection and Isolation Panel [NHANES]    Frequency of Communication with Friends and Family: Once a week    Frequency of Social Gatherings with Friends and Family: Once a week    Attends Religious Services: More than 4 times per year    Active Member of [employer] or Organizations: No    Attends Engineer, structural: Not on file    Marital Status: Living with partner  Intimate Partner Violence: Not on file    Family History  Problem Relation Age of Onset   Heart disease Mother    Kidney disease Mother    COPD Mother    Cancer Mother    Cancer Father    COPD Father      Review of Systems  Constitutional: Negative.  Negative for chills and fever.  HENT: Negative.  Negative for congestion and sore throat.   Respiratory: Negative.  Negative for cough and shortness of breath.   Cardiovascular: Negative.  Negative for chest pain and palpitations.  Gastrointestinal:  Negative for abdominal pain, diarrhea, nausea and vomiting.  Genitourinary: Negative.  Negative for dysuria and hematuria.  Skin: Negative.  Negative for rash.  Neurological:  Negative for dizziness and headaches.  Psychiatric/Behavioral:  Positive for depression.   All other systems reviewed and are negative.   Vitals:   11/27/23 1513  BP: (!) 142/78  Pulse: 90  Temp: 98.7 F (37.1  C)  SpO2: 99%    Physical Exam Vitals reviewed.  Constitutional:      Appearance: Normal appearance.  HENT:     Head: Normocephalic.     Mouth/Throat:     Mouth: Mucous membranes are moist.     Pharynx: Oropharynx is clear.  Eyes:     Extraocular Movements: Extraocular movements intact.     Pupils: Pupils are equal, round, and reactive to light.  Cardiovascular:     Rate and Rhythm: Normal rate and regular rhythm.     Pulses: Normal pulses.     Heart sounds: Normal heart sounds.  Pulmonary:     Effort: Pulmonary effort is normal.     Breath sounds: Normal breath sounds.  Musculoskeletal:     Cervical back: No tenderness.  Lymphadenopathy:  Cervical: No cervical adenopathy.  Skin:    General: Skin is warm and dry.  Neurological:     General: No focal deficit present.     Mental Status: She is alert and oriented to person, place, and time.  Psychiatric:        Mood and Affect: Mood normal.        Behavior: Behavior normal.      ASSESSMENT & PLAN: A total of 43 minutes was spent with the patient and counseling/coordination of care regarding preparing for this visit, review of most recent office visit notes, review of multiple chronic medical conditions and their management, review of all medications, review of most recent bloodwork results, review of health maintenance items, education on nutrition, prognosis, documentation, and need for follow up.   Problem List Items Addressed This Visit       Cardiovascular and Mediastinum   Essential hypertension   BP Readings from Last 3 Encounters:  11/27/23 (!) 142/78  06/23/23 (!) 148/84  04/27/23 (!) 138/98  Well-controlled hypertension with normal blood pressure readings at home Continue valsartan 40 mg daily         Endocrine   Graves disease   Relevant Orders   TSH   Postablative hypothyroidism   Clinically euthyroid TSH done today Continue Synthroid 50 mcg daily      Relevant Orders   TSH     Other    Smoker   Cardiovascular and cancer risks associated with smoking discussed. Smoking cessation advice given. States she is smoking less.      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  Referral placed today In the meantime recommend to start Trintellix 5 mg daily      Relevant Medications   vortioxetine HBr (TRINTELLIX) 5 MG TABS tablet   Other Relevant Orders   Ambulatory referral to Psychiatry   Ambulatory referral to Allergy   CBC with Differential/Platelet   Comprehensive metabolic panel with GFR   TSH   Hemoglobin A1c   Vitamin B12   VITAMIN D 25 Hydroxy (Vit-D Deficiency, Fractures)   Other Visit Diagnoses       Allergy, initial encounter       Relevant Medications   levocetirizine (XYZAL) 5 MG tablet      Patient Instructions  Major Depressive Disorder, Adult Major depressive disorder (MDD) is a mental health condition. People with this disorder feel very sad, hopeless, and lose interest in things. Symptoms last most of the day, almost every day, for 2 weeks. MDD can affect: Relationships. Work and school. Things you usually like to do. What are the causes? The cause of MDD is not known. What increases the risk? Having family members with depression. Being female. Family problems. Alcohol or drug misuse. A lot of stress in your life, such as from: Living without basic needs such as food and housing. Being treated poorly because of race, sex, or religion (discrimination). Things that caused you pain as a child, especially if you lost a parent or were abused. Health and mental problems that you have had for a long time. What are the  signs or symptoms? The main symptoms of this condition are: Being sad all the  time. Being grouchy (irritable) all the time. Not enjoying the things you usually like. Sleeping too much or too little. Eating too much or too little. Feeling tired. Other symptoms include: Gaining or losing weight, without knowing why. Being restless and weak. Feeling hopeless, worthless, or guilty. Trouble thinking or making decisions. Thoughts of hurting yourself or others, or thoughts of ending your life. Spending a lot of time alone. Being unable to do daily tasks. If you have very bad MDD, you may: Believe things that are not true. Hear, see, taste, or feel things that are not there. Have mild depression that lasts for at least 2 years. Feel very sad and hopeless. Have trouble speaking or moving. Feel very sad during some seasons. How is this treated? Talk therapy. This teaches you about thoughts, feelings, and actions and how to change them. This can also help you to talk with others. This can be done with members of your family. Medicines. Lifestyle changes. You may need to: Limit alcohol use. Stop using drugs, if you use them. Exercise. Get plenty of sleep. Eat healthy. Spend more time outdoors. Brain stimulation. This may be done when symptoms are very bad or have not gotten better. Follow these instructions at home: Alcohol use Do not drink alcohol if: Your health care provider tells you not to drink. You are pregnant, may be pregnant, or are planning to become pregnant. If you drink alcohol: Limit how much you use to: 0-1 drink a day for women. 0-2 drinks a day for men. Know how much alcohol is in your drink. In the [place] one drink equals one 12 oz bottle of beer (355 mL), one 5 oz glass of wine (148 mL), or one 1 oz glass of hard liquor (44 mL). Activity Exercise as told by your doctor. Spend time outdoors. Make time to do the things you enjoy. Find ways to deal with stress.  Try to: Meditate. Do deep breathing. Spend time in nature. Keep a journal. Return to your normal activities when your doctor says that it is safe. General instructions  Take over-the-counter and prescription medicines only as told by your doctor. Talk to your doctor about: Alcohol use. It can affect medicines. Any drug use. Eat healthy foods. Get a lot of sleep. Think about joining a support group. Ask your doctor about that. Keep all follow-up visits. Your doctor will need to check on your mood, behavior, and medicines, and change your treatment as needed. Where to find more information: [employer] on Mental Illness: nami[employer] of Mental Health: BloggerCourse.com American Psychiatric Association: psychiatry.org Contact a doctor if: You feel worse. You get new symptoms. Get help right away if: You hurt yourself on purpose (self-harm). You have thoughts about hurting yourself or others. You see, hear, taste, smell, or feel things that are not there. Get help right away if you feel like you may hurt yourself or others, or have thoughts about taking your own life. Go to your nearest emergency room or: Call 911. Call the National Suicide Prevention Lifeline at (838) 442-4490 or 988. This is open 24 hours a day. Text the Crisis Text Line at 219-358-7745. This information is not intended to replace advice given to you by your health care provider. Make sure you discuss any questions you have with your health care provider. Document Revised: 12/07/2021 Document Reviewed: 12/07/2021 Elsevier Patient Education  2024 Elsevier Inc.     Edwina Barth, MD [facility]

## 2023-11-27 NOTE — Assessment & Plan Note (Signed)
 History of chronic depression, currently active Needs psychiatric evaluation.  Referral placed today In the meantime recommend to start Trintellix 5 mg daily

## 2023-11-27 NOTE — Assessment & Plan Note (Signed)
 BP Readings from Last 3 Encounters:  11/27/23 (!) 142/78  06/23/23 (!) 148/84  04/27/23 (!) 138/98  Well-controlled hypertension with normal blood pressure readings at home Continue valsartan 40 mg daily

## 2023-11-27 NOTE — Assessment & Plan Note (Signed)
Clinically euthyroid. TSH done today Continue Synthroid 50 mcg daily.

## 2023-11-27 NOTE — Assessment & Plan Note (Signed)
 Vitamin D level done today Recommend to continue daily supplementation with over-the-counter vitamin D highest dose

## 2023-11-28 ENCOUNTER — Encounter: Payer: Self-pay | Admitting: Emergency Medicine

## 2023-11-29 ENCOUNTER — Encounter: Payer: Self-pay | Admitting: Emergency Medicine

## 2023-11-30 ENCOUNTER — Other Ambulatory Visit: Payer: Self-pay | Admitting: Emergency Medicine

## 2023-11-30 ENCOUNTER — Encounter: Payer: Self-pay | Admitting: Emergency Medicine

## 2023-11-30 DIAGNOSIS — F331 Major depressive disorder, recurrent, moderate: Secondary | ICD-10-CM

## 2023-11-30 MED ORDER — ESCITALOPRAM OXALATE 10 MG PO TABS: 10.0000 mg | ORAL_TABLET | Freq: Every day | ORAL | 7 refills | Status: DC

## 2023-11-30 NOTE — Telephone Encounter (Signed)
 New prescription for Lexapro 10 mg sent to pharmacy of record today.  Thanks.

## 2023-12-08 ENCOUNTER — Encounter: Payer: Self-pay | Admitting: Emergency Medicine

## 2023-12-12 ENCOUNTER — Encounter: Payer: Self-pay | Admitting: Emergency Medicine

## 2023-12-15 ENCOUNTER — Other Ambulatory Visit: Payer: Self-pay | Admitting: Physical Medicine and Rehabilitation

## 2023-12-15 ENCOUNTER — Encounter: Payer: Self-pay | Admitting: Radiology

## 2023-12-19 ENCOUNTER — Encounter: Payer: Self-pay | Admitting: Emergency Medicine

## 2023-12-22 ENCOUNTER — Encounter: Payer: Self-pay | Admitting: Emergency Medicine

## 2023-12-26 ENCOUNTER — Other Ambulatory Visit: Payer: Self-pay

## 2023-12-26 ENCOUNTER — Ambulatory Visit
Admission: RE | Admit: 2023-12-26 | Discharge: 2023-12-26 | Disposition: A | Discharge status: Home / Self Care | Source: Ambulatory Visit | Attending: Family Medicine | Admitting: Family Medicine

## 2023-12-26 VITALS — BP 159/99 | HR 86 | Temp 98.6°F | Resp 18

## 2023-12-26 DIAGNOSIS — B349 Viral infection, unspecified: Secondary | ICD-10-CM | POA: Diagnosis not present

## 2023-12-26 LAB — POC COVID19/FLU A&B COMBO
Covid Antigen, POC: NEGATIVE
Influenza A Antigen, POC: NEGATIVE
Influenza B Antigen, POC: NEGATIVE

## 2023-12-26 MED ORDER — IPRATROPIUM BROMIDE 0.03 % NA SOLN: 2.0000 | Freq: Two times a day (BID) | NASAL | 0 refills | Status: DC | PRN

## 2023-12-26 MED ORDER — BENZONATATE 200 MG PO CAPS: 200.0000 mg | ORAL_CAPSULE | Freq: Three times a day (TID) | ORAL | 0 refills | Status: DC | PRN

## 2023-12-26 NOTE — ED Provider Notes (Signed)
 [facility]    CSN: [id number] Arrival date & time: 12/26/23  1333      History   Chief Complaint Chief Complaint  Patient presents with   Cough    I have a stuffy nose, coughing up white flem - Entered by patient    HPI Sarah Fritz is a 43 y.o. female  presents for evaluation of URI symptoms for 2 days. Patient reports associated symptoms of cough, congestion. Denies N/V/D, sore throat, fevers, ear pain, body aches, shortness of breath. Patient does not have a hx of asthma. Patient is an active smoker.   Reports no sick contacts.  Pt has taken cold medicine OTC for symptoms. Pt has no other concerns at this time.    Cough   Past Medical History:  Diagnosis Date   Abscess    Anemia    Chlamydia    Depression    Gonorrhea    Graves disease    Hypertension     Patient Active Problem List   Diagnosis Date Noted   Moderate episode of recurrent major depressive disorder (HCC) 11/27/2023   Sciatica, left side 04/27/2023   Vitamin D  deficiency 04/27/2023   Leg cramps 04/20/2023   Stress and adjustment reaction 10/25/2022   Mass of upper inner quadrant of left breast 10/25/2022   Family history of colon cancer in father 10/25/2022   Essential hypertension 06/22/2022   Anemia 03/09/2022   Heavy menses 03/07/2022   Hypertension 03/01/2022   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 Authorizing Provider  benzonatate  (TESSALON ) 200 MG capsule Take 1  capsule (200 mg total) by mouth 3 (three) times daily as needed. 12/26/23  Yes Denisa Enterline, Jodi R, NP  cyclobenzaprine  (FLEXERIL ) 10 MG tablet TAKE 1 TABLET(10 MG) BY MOUTH AT BEDTIME 12/15/23   Williams, Megan E, NP  escitalopram  (LEXAPRO ) 10 MG tablet Take 1 tablet (10 mg total) by mouth daily. 11/30/23   Sagardia, Miguel Jose, MD  ipratropium (ATROVENT) 0.03 % nasal spray Place 2 sprays into both nostrils every 12 (twelve) hours as needed (congestion). 12/26/23  Yes Dollie Mayse, Jodi R, NP  levocetirizine (XYZAL ) 5 MG tablet Take 1 tablet (5 mg total) by mouth every evening. 11/27/23   Sagardia, Miguel Jose, MD  levothyroxine  (SYNTHROID ) 50 MCG tablet Take 1 tablet (50 mcg total) by mouth daily before breakfast. 06/22/22   Elvira Hammersmith, MD  valsartan  (DIOVAN ) 40 MG tablet TAKE 1 TABLET(40 MG) BY MOUTH DAILY 07/16/23   Elvira Hammersmith, MD  Vitamin D , Ergocalciferol , (DRISDOL ) 1.25 MG (50000 UNIT) CAPS capsule Take 1 capsule by mouth once a week. 04/21/23   [provider]    Family History Family  History  Problem Relation Age of Onset   Heart disease Mother    Kidney disease Mother    COPD Mother    Cancer Mother    Cancer Father    COPD Father     Social History Social History   Tobacco Use   Smoking status: Every Day    Current packs/day: 0.50    Types: Cigarettes   Smokeless tobacco: Current  Vaping Use   Vaping status: Former  Substance Use Topics   Alcohol use: Yes    Comment: occasional   Drug use: Not Currently    Types: Marijuana     Allergies   Percocet [oxycodone-acetaminophen] and Vicodin [hydrocodone-acetaminophen]   Review of Systems Review of Systems  HENT:  Positive for congestion.   Respiratory:  Positive for cough.      Physical Exam Triage Vital Signs ED Triage Vitals  Encounter Vitals Group     BP 12/26/23 1342 (!) 159/99     Systolic BP Percentile --      Diastolic BP Percentile --      Pulse Rate 12/26/23 1342 86     Resp 12/26/23  1342 18     Temp 12/26/23 1342 98.6 F (37 C)     Temp Source 12/26/23 1342 Oral     SpO2 12/26/23 1342 95 %     Weight --      Height --      Head Circumference --      Peak Flow --      Pain Score 12/26/23 1340 0     Pain Loc --      Pain Education --      Exclude from Growth Chart --    No data found.  Updated Vital Signs BP (!) 159/99   Pulse 86   Temp 98.6 F (37 C) (Oral)   Resp 18   LMP 12/24/2023   SpO2 95%   Visual Acuity Right Eye Distance:   Left Eye Distance:   Bilateral Distance:    Right Eye Near:   Left Eye Near:    Bilateral Near:     Physical Exam Vitals and nursing note reviewed.  Constitutional:      General: She is not in acute distress.    Appearance: She is well-developed. She is not ill-appearing.  HENT:     Head: Normocephalic and atraumatic.     Right Ear: Tympanic membrane and ear canal normal.     Left Ear: Tympanic membrane and ear canal normal.     Nose: Congestion present.     Mouth/Throat:     Mouth: Mucous membranes are moist.     Pharynx: Oropharynx is clear. Uvula midline. No posterior oropharyngeal erythema.     Tonsils: No tonsillar exudate or tonsillar abscesses.  Eyes:     Conjunctiva/sclera: Conjunctivae normal.     Pupils: Pupils are equal, round, and reactive to light.  Cardiovascular:     Rate and Rhythm: Normal rate and regular rhythm.     Heart sounds: Normal heart sounds.  Pulmonary:     Effort: Pulmonary effort is normal.     Breath sounds: Normal breath sounds. No wheezing, rhonchi or rales.  Musculoskeletal:     Cervical back: Normal range of motion and neck supple.  Lymphadenopathy:     Cervical: No cervical adenopathy.  Skin:    General: Skin is warm and dry.  Neurological:     General: No focal deficit present.     Mental  Status: She is alert and oriented to person, place, and time.  Psychiatric:        Mood and Affect: Mood normal.        Behavior: Behavior normal.      UC Treatments / Results   Labs (all labs ordered are listed, but only abnormal results are displayed) Labs Reviewed  POC COVID19/FLU A&B COMBO    EKG   Radiology No results found.  Procedures Procedures (including critical care time)  Medications Ordered in UC Medications - No data to display  Initial Impression / Assessment and Plan / UC Course  I have reviewed the triage vital signs and the nursing notes.  Pertinent labs & imaging results that were available during my care of the patient were reviewed by me and considered in my medical decision making (see chart for details).     Reviewed exam and symptoms with patient.  No red flags.  Negative rapid flu and COVID testing.  Discussed viral illness and symptomatic treatment.  Tessalon  and Atrovent nasal spray as needed.  Advise rest fluids and PCP follow-up 2 to 3 days for recheck.  ER precautions reviewed and patient verbalized understanding. Final Clinical Impressions(s) / UC Diagnoses   Final diagnoses:  Viral illness     Discharge Instructions      You have tested negative for COVID and flu.  You may take Tessalon  3 times a day as needed for your cough.  Use the Atrovent nasal spray twice daily as needed for your nasal congestion.  Lots of rest and fluids.  Please follow-up with your PCP in 2 to 3 days for recheck.  Please go to the ER if you develop any worsening symptoms.  I hope you feel better soon!   ED Prescriptions     Medication Sig Dispense Auth. Provider   benzonatate  (TESSALON ) 200 MG capsule Take 1 capsule (200 mg total) by mouth 3 (three) times daily as needed. 20 capsule Azariel Banik, Jodi R, NP   ipratropium (ATROVENT) 0.03 % nasal spray Place 2 sprays into both nostrils every 12 (twelve) hours as needed (congestion). 30 mL Ferman Basilio, Jodi R, NP      PDMP not reviewed this encounter.   Alleen Arbour, NP 12/26/23 1421

## 2023-12-26 NOTE — Discharge Instructions (Signed)
 You have tested negative for COVID and flu.  You may take Tessalon  3 times a day as needed for your cough.  Use the Atrovent nasal spray twice daily as needed for your nasal congestion.  Lots of rest and fluids.  Please follow-up with your PCP in 2 to 3 days for recheck.  Please go to the ER if you develop any worsening symptoms.  I hope you feel better soon!

## 2023-12-26 NOTE — ED Triage Notes (Signed)
 Pt c/o productive cough w/white mucous, chest pain, runny nosex2d.

## 2024-01-01 ENCOUNTER — Encounter (HOSPITAL_COMMUNITY): Payer: Self-pay | Admitting: Family

## 2024-01-01 ENCOUNTER — Ambulatory Visit (HOSPITAL_BASED_OUTPATIENT_CLINIC_OR_DEPARTMENT_OTHER): Payer: Self-pay | Admitting: Family

## 2024-01-01 ENCOUNTER — Telehealth (HOSPITAL_COMMUNITY): Payer: Self-pay | Admitting: Psychiatry

## 2024-01-01 ENCOUNTER — Other Ambulatory Visit: Payer: Self-pay

## 2024-01-01 DIAGNOSIS — F331 Major depressive disorder, recurrent, moderate: Secondary | ICD-10-CM

## 2024-01-01 MED ORDER — ESCITALOPRAM OXALATE 20 MG PO TABS: 10.0000 mg | ORAL_TABLET | Freq: Every day | ORAL | 0 refills | Status: DC

## 2024-01-01 NOTE — Telephone Encounter (Signed)
 D:  Dan Dun, NP referred pt to virtual MH-IOP.  A:  Placed call and oriented pt.  Encouraged pt to verify her benefits with her insurance company.  [facility] scheduled for tomorrow at 9:30 a.m; start on 01-04-24 @ 9 a.m.  Informed pt that's when her paperwork will be completed for short term disability.  Inform Dan Dun, NP.  R:  Pt receptive.
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
## 2024-01-02 ENCOUNTER — Encounter (HOSPITAL_COMMUNITY): Payer: Self-pay | Admitting: Psychiatry

## 2024-01-02 ENCOUNTER — Other Ambulatory Visit (HOSPITAL_COMMUNITY): Attending: Psychiatry | Admitting: Psychiatry

## 2024-01-02 DIAGNOSIS — Z1330 Encounter for screening examination for mental health and behavioral disorders, unspecified: Secondary | ICD-10-CM
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
## 2024-01-04 ENCOUNTER — Encounter (HOSPITAL_COMMUNITY): Payer: Self-pay

## 2024-01-04 ENCOUNTER — Other Ambulatory Visit (HOSPITAL_COMMUNITY): Attending: Family | Admitting: Psychiatry

## 2024-01-04 ENCOUNTER — Encounter (HOSPITAL_COMMUNITY): Payer: Self-pay | Admitting: Psychiatry

## 2024-01-04 DIAGNOSIS — F331 Major depressive disorder, recurrent, moderate: Secondary | ICD-10-CM | POA: Diagnosis present

## 2024-01-04 NOTE — Progress Notes (Signed)
 Virtual Visit via Video Note   I connected with Sherin Dingwall on 01/04/24 at  9:00 AM EDT by a video enabled telemedicine application and verified that I am speaking with the correct person using two identifiers.   At orientation to the IOP program, Case Manager discussed the limitations of evaluation and management by telemedicine and the availability of in person appointments. The patient expressed understanding and agreed to proceed with virtual visits throughout the duration of the program.   Location:  Patient: Patient Home Provider: OPT BH Office   History of Present Illness: MDD   Observations/Objective: Check In: Case Manager checked in with all participants to review discharge dates, insurance authorizations, work-related documents and needs from the treatment team regarding medications. Madelyn stated needs and engaged in discussion.    Initial Therapeutic Activity: Counselor facilitated a check-in with Bari to assess for safety, sobriety and medication compliance.  Counselor also inquired about Dashanti's current emotional ratings, as well as any significant changes in thoughts, feelings or behavior since previous check in.  Tanise presented for session on time and was alert, oriented x5, with no evidence or self-report of active SI/HI or A/V H.  Bryli reported compliance with medication and denied use of alcohol or illicit substances.  Whitnie reported scores of 5/10 for depression, 6/10 for anxiety, and 0/10 for anger/irritability.  Nadine denied any recent outbursts or panic attacks.  Nattaly reported that a struggle has been getting out of bed each day, noting that she has been sleeping in frequently and rarely goes out of the house.  Kaytlen reported that a success was getting out of the house yesterday and socializing with some neighbors.  Phaedra reported that her goal today is to start moving her work supplies out of the bedroom since this can interfere with sleep.          Second Therapeutic  Activity: Counselor introduced topic of self-care today.  Counselor explained how this can be defined as the things one does to maintain good health and improve well-being.  Counselor provided members with a self-care assessment form to complete.  This handout featured various sub-categories of self-care, including physical, psychological/emotional, social, spiritual, and professional.  Members were asked to rank their engagement in the activities listed for each dimension on a scale of 1-3, with 1 indicating 'Poor', 2 indicating 'Ok', and 3 indicating 'Well'.  Counselor invited members to share results of their assessment, and inquired about which areas of self-care they are doing well in, as well as areas that require attention, and how they plan to begin addressing this during treatment.  Intervention was effective, as evidenced by Brian Campanile successfully completing initial 2 sections of assessment and actively engaging in discussion on subject, reporting that she is excelling in areas such as taking care of personal hygiene, going to preventative medical appointments, learning new things, and recognizing her own strengths and achievements, but would benefit from focusing more on areas such as eating healthy foods, exercise, participating in fun activities, getting enough sleep,  participating in hobbies, taking time off from work for vacations or daytrips, getting away from distractions, expressing her feelings in healthy ways, and finding reasons to laugh.  Ezra reported that she would work to improve self-care deficits by adjusting diet to include more healthy options such as fruits and vegetables, getting more exercise through walks with her dog, improving overall sleep by setting healthier boundaries with social media at night, planning a family trip to get away from the job,  using group as a safe space to express herself, exploring new hobbies to fill time such as blogging, or cooking.    Assessment and  Plan: Counselor recommends that Sydell remain in IOP treatment to better manage mental health symptoms, ensure stability and pursue completion of treatment plan goals. Counselor recommends adherence to crisis/safety plan, taking medications as prescribed, and following up with medical professionals if any issues arise.    Follow Up Instructions: Counselor will send [employer] Teams link for session tomorrow.  Saskia was advised to call back or seek an in-person evaluation if the symptoms worsen or if the condition fails to improve as anticipated.   Collaboration of Care:   Medication Management AEB Dan Dun, NP or Dr. Marilou Showman                                          Case Manager AEB Molinda Angelica, CNA    Patient/Guardian was advised Release of Information must be obtained prior to any record release in order to collaborate their care with an outside provider. Patient/Guardian was advised if they have not already done so to contact the registration department to sign all necessary forms in order for us  to release information regarding their care.    Consent: Patient/Guardian gives verbal consent for treatment and assignment of benefits for services provided during this visit. Patient/Guardian expressed understanding and agreed to proceed.   I provided 180 minutes of non-face-to-face time during this encounter.   Desmond Florida, LCSW, LCAS 01/04/24

## 2024-01-04 NOTE — Progress Notes (Signed)
 Virtual Visit via Telephone Note  I connected with Sarah Fritz on 01/04/24 at  9:00 AM EDT by telephone and verified that I am speaking with the correct person using two identifiers.  Location: Patient: Home Provider: Office   I discussed the limitations, risks, security and privacy concerns of performing an evaluation and management service by telephone and the availability of in person appointments. I also discussed with the patient that there may be a patient responsible charge related to this service. The patient expressed understanding and agreed to proceed.    I discussed the assessment and treatment plan with the patient. The patient was provided an opportunity to ask questions and all were answered. The patient agreed with the plan and demonstrated an understanding of the instructions.   The patient was advised to call back or seek an in-person evaluation if the symptoms worsen or if the condition fails to improve as anticipated.  I provided 10 minutes of non-face-to-face time during this encounter.   Levester Reagin, NP    Psychiatric Initial Adult Assessment   Patient Identification: Sarah Fritz MRN:  [id number] Date of Evaluation:  01/04/2024 Referral Source: T.Harles Lied NP Chief Complaint:     Visit Diagnosis:    ICD-10-CM   1. Moderate episode of recurrent major depressive disorder (HCC)  F33.1       History of Present Illness:  Sarah Fritz is a 43 year old African-American female who presents with multiple stressors related to grief and loss, family strain, increased depression and workplace instability.  Patient carries a diagnosis related to depression, bipolar disorder, generalized anxiety disorder.  Recently seen and evaluated by this provider.  Discussed titrating Lexapro  10 mg to 20 mg daily and patient to start intensive outpatient programming on 01/04/2024.  Contacted patient who denied any concerns.  States she is eager to get started.   States she has been taking higher dose of medication.  Denies that she needs any refills at this time.  Continues to deny suicidal or homicidal ideations.  Denies auditory or visual hallucinations.  Per my initial assessment note:" Reports she is originally from [place], [place] .  States she and her son and husband are the only one that lives in [place].  Reports her son is 57 years old, reports he is doing well.  States most of her family resides back in [place] .  Written documentation stated that her symptoms are "causing me to not be able to function in everyday life with my family and work life."  Reports she is currently caring for her goddaughter who is 44 years old.  Stated that her behavior is causing increased depression, symptoms of worry and mood irritability because she is having a difficult following rules.  Stated her granddaughter parent is transitioning/transgender"  Discussed case management to follow-up with individual therapy at discharge.  Support encouragement reassurance was provided.  Associated Signs/Symptoms: Depression Symptoms:  depressed mood, anxiety, (Hypo) Manic Symptoms:  Distractibility, Anxiety Symptoms:  Excessive Worry, Psychotic Symptoms:  Hallucinations: None PTSD Symptoms: NA  Past Psychiatric History:   Previous Psychotropic Medications: Yes   Substance Abuse History in the last 12 months:  No.  Consequences of Substance Abuse: NA  Past Medical History:  Past Medical History:  Diagnosis Date   Abscess    Anemia    Chlamydia    Depression    Gonorrhea    Graves disease    Hypertension     Past Surgical History:  Procedure Laterality Date   WISDOM TOOTH  EXTRACTION      Family Psychiatric History:   Family History:  Family History  Problem Relation Age of Onset   Heart disease Mother    Kidney disease Mother    COPD Mother    Cancer Mother    Cancer Father    COPD Father     Social History:   Social History   Socioeconomic  History   Marital status: Single    Spouse name: Not on file   Number of children: Not on file   Years of education: Not on file   Highest education level: Associate degree: occupational, Scientist, product/process development, or vocational program  Occupational History   Not on file  Tobacco Use   Smoking status: Every Day    Current packs/day: 0.50    Types: Cigarettes   Smokeless tobacco: Current  Vaping Use   Vaping status: Former  Substance and Sexual Activity   Alcohol use: Yes    Comment: hollidays   Drug use: Yes    Types: Marijuana    Comment: daily   Sexual activity: Yes    Birth control/protection: None  Other Topics Concern   Not on file  Social History Narrative   Not on file   Social Drivers of Health   Financial Resource Strain: Low Risk  (11/27/2023)   Overall Financial Resource Strain (CARDIA)    Difficulty of Paying Living Expenses: Not hard at all  Food Insecurity: Food Insecurity Present (11/27/2023)   Hunger Vital Sign    Worried About Running Out of Food in the Last Year: Sometimes true    Ran Out of Food in the Last Year: Never true  Transportation Needs: No Transportation Needs (11/27/2023)   PRAPARE - Administrator, Civil Service (Medical): No    Lack of Transportation (Non-Medical): No  Physical Activity: Insufficiently Active (11/27/2023)   Exercise Vital Sign    Days of Exercise per Week: 1 day    Minutes of Exercise per Session: 30 min  Stress: Stress Concern Present (11/27/2023)   [employer] of Occupational Health - Occupational Stress Questionnaire    Feeling of Stress : Very much  Social Connections: Moderately Isolated (11/27/2023)   Social Connection and Isolation Panel [NHANES]    Frequency of Communication with Friends and Family: Once a week    Frequency of Social Gatherings with Friends and Family: Once a week    Attends Religious Services: More than 4 times per year    Active Member of [employer] or Organizations: No    Attends Museum/gallery exhibitions officer: Not on file    Marital Status: Living with partner    Additional Social History:   Allergies:   Allergies  Allergen Reactions   Percocet [Oxycodone-Acetaminophen] Nausea And Vomiting and Other (See Comments)    Sweating, pass out   Vicodin [Hydrocodone-Acetaminophen] Nausea And Vomiting and Other (See Comments)    Sweating, pass out    Metabolic Disorder Labs: Lab Results  Component Value Date   HGBA1C 5.7 11/27/2023   No results found for: "PROLACTIN" Lab Results  Component Value Date   CHOL 162 03/01/2022   TRIG 182.0 (H) 03/01/2022   HDL 40.00 03/01/2022   CHOLHDL 4 03/01/2022   VLDL 36.4 03/01/2022   LDLCALC 85 03/01/2022   LDLCALC 38 07/14/2016   Lab Results  Component Value Date   TSH 0.63 11/27/2023    Therapeutic Level Labs: No results found for: "LITHIUM" No results found for: "CBMZ" No results found for: "VALPROATE"  Current Medications: Current Outpatient Medications  Medication Sig Dispense Refill   benzonatate  (TESSALON ) 200 MG capsule Take 1 capsule (200 mg total) by mouth 3 (three) times daily as needed. 20 capsule 0   cyclobenzaprine  (FLEXERIL ) 10 MG tablet TAKE 1 TABLET(10 MG) BY MOUTH AT BEDTIME 30 tablet 0   escitalopram  (LEXAPRO ) 20 MG tablet Take 0.5 tablets (10 mg total) by mouth daily. 30 tablet 0   ipratropium (ATROVENT ) 0.03 % nasal spray Place 2 sprays into both nostrils every 12 (twelve) hours as needed (congestion). (Patient not taking: Reported on 01/01/2024) 30 mL 0   levocetirizine (XYZAL ) 5 MG tablet Take 1 tablet (5 mg total) by mouth every evening. 90 tablet 1   levothyroxine  (SYNTHROID ) 50 MCG tablet Take 1 tablet (50 mcg total) by mouth daily before breakfast. 90 tablet 3   valsartan  (DIOVAN ) 40 MG tablet TAKE 1 TABLET(40 MG) BY MOUTH DAILY 90 tablet 3   Vitamin D , Ergocalciferol , (DRISDOL ) 1.25 MG (50000 UNIT) CAPS capsule Take 1 capsule by mouth once a week.     No current facility-administered  medications for this visit.    Musculoskeletal: Telephonic assessment  Psychiatric Specialty Exam: Review of Systems  Psychiatric/Behavioral:  Positive for decreased concentration. Negative for suicidal ideas. The patient is nervous/anxious.   All other systems reviewed and are negative.   Last menstrual period 12/24/2023.There is no height or weight on file to calculate BMI.  General Appearance: NA  Eye Contact:  NA  Speech:  Clear and Coherent  Volume:  Normal  Mood:  Depressed  Affect:  Congruent  Thought Process:  Coherent  Orientation:  Full (Time, Place, and Person)  Thought Content:  WDL and Logical  Suicidal Thoughts:  No  Homicidal Thoughts:  No  Memory:  Immediate;   Good Recent;   Good  Judgement:  Good  Insight:  Good  Psychomotor Activity:  Normal  Concentration:  Concentration: Good  Recall:  Good  Fund of Knowledge:Good  Language: Good  Akathisia:  No  Handed:  Right  AIMS (if indicated):  not done  Assets:  Communication Skills Desire for Improvement Resilience Social Support  ADL's:  Intact  Cognition: WNL  Sleep:  Fair   Screenings: GAD-7    Garment/textile technologist Visit from 11/27/2023 in [facility] Visit from 10/25/2022 in [facility] [place] [facility] Visit from 06/28/2017 in [facility] [place] Office Visit from 11/23/2016 in [facility] [place] [facility]. [facility] Office Visit from 07/14/2016 in [facility] [place] [facility] [place]. [facility]  Total GAD-7 Score 16 7 0 0 0      PHQ2-9    Flowsheet Row Counselor from 01/02/2024 in [facility] Office Visit from 01/01/2024 in [facility] Visit from 11/27/2023 in [facility] Visit from 04/27/2023 in [facility] Visit from 10/25/2022 in [facility]  PHQ-2 Total Score 5 6 6  0 3  PHQ-9 Total Score 17 23 21  -- 8      Flowsheet Row Counselor from 01/02/2024 in [facility] Office Visit from 01/01/2024 [facility] from 12/26/2023 in [facility]  C-SSRS  RISK CATEGORY Error: Question 6 not populated No Risk No Risk       Assessment and Plan:  Patient to start partial hospitalization program Continue medications as directed  Collaboration of Care: Referral or follow-up with counselor/therapist AEB case management to follow-up with therapy services at discharge   patient enrolled intensive outpatient hospitalization Program, patient's current medications are to be continued, the following medications are being prescribed Lexap a comprehensive treatment plan will be developed and side effects of medications have been reviewed with patient  Treatment options and alternatives reviewed with patient and patient understands the above plan. Treatment plan was reviewed and agreed upon by NP T.Harles Lied and patient  Makeyla Govan- Mainor need for group services.   Patient/Guardian was advised Release of Information must be obtained prior to any record release in order to collaborate their care with an outside provider. Patient/Guardian was advised if they have not already done so to contact the registration department to sign all necessary forms in order for us  to release information regarding their care.   Consent: Patient/Guardian gives verbal consent for treatment and assignment of benefits for services provided during this visit. Patient/Guardian expressed understanding and agreed to proceed.   Levester Reagin, NP 5/22/20258:41 AM

## 2024-01-05 ENCOUNTER — Other Ambulatory Visit (HOSPITAL_COMMUNITY): Attending: Psychiatry | Admitting: Licensed Clinical Social Worker

## 2024-01-05 DIAGNOSIS — F331 Major depressive disorder, recurrent, moderate: Secondary | ICD-10-CM | POA: Diagnosis not present

## 2024-01-05 NOTE — Progress Notes (Signed)
 Virtual Visit via Video Note   I connected with Sherin Dingwall on 01/05/24 at 9am by video enabled telemedicine application and verified that I am speaking with the correct person using two identifiers.   I discussed the limitations, risks, security and privacy concerns of performing an evaluation and management service by video and the availability of in person appointments. I also discussed with the patient that there may be a patient responsible charge related to this service. The patient expressed understanding and agreed to proceed.   I discussed the assessment and treatment plan with the patient. The patient was provided an opportunity to ask questions and all were answered. The patient agreed with the plan and demonstrated an understanding of the instructions.   The patient was advised to call back or seek an in-person evaluation if the symptoms worsen or if the condition fails to improve as anticipated.  I provided 1 hour of non-face-to-face time during this encounter.     Desmond Florida, LCSW, LCAS ________________________________ THERAPIST PROGRESS NOTE   Session Time:   9:00am - 10:00am   Location: Patient: Patient Home  Therapist: Home Office   Participation Level: Active   Behavioral Response: Alert, casually dressed, depressed mood/affect   Type of Therapy: Individual Therapy    Treatment Goals addressed: Mood management; Medication compliance  Progress Towards Goals: Progressing    Interventions: CBT, psychoeducation on stages of grief     Summary: Soley Harriss is a 43 year old engaged [place] American female that presented for therapy session today with diagnosis of Major Depressive Disorder, recurrent, moderate.     Suicidal/Homicidal: None; without intent or plan.   Therapist Response:  Clinician met with Brian Campanile for virtual therapy session today and assessed for safety, sobriety, and medication compliance.  Trang was scheduled to attend [facility], but due to  low group census, an individual session was offered instead.  Lynnell presented for appointment on time and was alert, oriented x5, with no evidence or self-report of active SI/HI or A/V H. He denied any abuse of alcohol or illicit substances.  Clinician inquired about Bettey's emotional ratings today, as well as any significant changes in thoughts, feelings, or behavior since previous check-in.  Yesli reported scores of 4/10 for depression, 0/10 for anxiety, and 4/10 for irritability.  Samon denied any recent outbursts or panic attacks.  Enslie reported that a success was feeling motivated after group yesterday, which helped her do some cleaning around the home.  Brandelyn reported that a struggle has been dealing with grief, as she had several close family members pass away in short succession over recent years.  Clinician expressed sympathy for Jayleene's losses, and provided her with psychoeducation on the 5 stages of grief, including denial, anger, bargaining, depression, and acceptance.  Clinician discussed how each stage can affect an individual, and provided strategies on how to faciliate healthy grieving as she processes these losses. Strategies provided to [facility] included taking time to allow healthy emotional expression (I.e. allowing oneself to cry when appropriate), engaging in healthy self-care activities for distraction, talking to people who can relate to the loss for support, and considering ways to memorialize the deceased in a meaningful way.  Interventions were effective, as evidenced by Paislyn's active engagement in discussion on topic, reporting that she could relate to every stage covered as she lost both parents.  Darielys reported that she was in denial of her mother's passing initially since she appeared to improve during hospice and tried to bargain with her  higher power to buy more time when both parents' health declined.  She reported that she experienced periods of anger and depression, and lashed out verbally  at medical providers she felt should have done more to help.  Moira reported that she also relied upon substances like cocaine to address her difficult feelings.  Violet reported that although she feels like she is coming to accept these losses, she would be open to additional resources.  Clinician encouraged Devone to consider community resources such as [facility] for grief counseling and support groups. Clinician will continue to monitor.          Plan: Meet again for group therapy next Tuesday.   Diagnosis:  Major Depressive Disorder, recurrent, moderate.   Collaboration of Care:   Medication Management AEB Dan Dun, NP or Dr. Marilou Showman                                          Case Manager AEB Molinda Angelica, CNA.                                                   Patient/Guardian was advised Release of Information must be obtained prior to any record release in order to collaborate their care with an outside provider. Patient/Guardian was advised if they have not already done so to contact the registration department to sign all necessary forms in order for us  to release information regarding their care.    Consent: Patient/Guardian gives verbal consent for treatment and assignment of benefits for services provided during this visit. Patient/Guardian expressed understanding and agreed to proceed.    Desmond Florida, LCSW, LCAS 01/05/24

## 2024-01-09 ENCOUNTER — Other Ambulatory Visit (HOSPITAL_COMMUNITY)

## 2024-01-09 DIAGNOSIS — F331 Major depressive disorder, recurrent, moderate: Secondary | ICD-10-CM | POA: Diagnosis present

## 2024-01-09 NOTE — Progress Notes (Signed)
 Virtual Visit via Video Note   I connected with Sherin Dingwall on 01/09/24 at  9:00 AM EDT by a video enabled telemedicine application and verified that I am speaking with the correct person using two identifiers.   At orientation to the IOP program, Case Manager discussed the limitations of evaluation and management by telemedicine and the availability of in person appointments. The patient expressed understanding and agreed to proceed with virtual visits throughout the duration of the program.   Location:  Patient: Patient Home Provider: Home Office   History of Present Illness: MDD   Observations/Objective: Check In: Case Manager checked in with all participants to review discharge dates, insurance authorizations, work-related documents and needs from the treatment team regarding medications. Graycen stated needs and engaged in discussion.    Initial Therapeutic Activity: Counselor facilitated a check-in with Uzma to assess for safety, sobriety and medication compliance.  Counselor also inquired about Alice's current emotional ratings, as well as any significant changes in thoughts, feelings or behavior since previous check in.  Berkleigh presented for session on time and was alert, oriented x5, with no evidence or self-report of active SI/HI or A/V H.  Alberto reported compliance with medication and denied use of alcohol or illicit substances.  Kiaira reported scores of 5/10 for depression, 6/10 for anxiety, and 7/10 for anger/irritability.  Gordie denied any recent panic attacks.  Jamari reported that a struggle was "Having a complete meltdown yesterday" and stated "I went off on the whole house".  Diantha reported that a success was cooking food for a track meet, which went well.  Marilla reported that her goal today is to get some rest after group, stating "I'm so tired".      Second Therapeutic Activity: Counselor engaged the group in discussion on managing work/life balance today to improve mental health and  wellness.  Counselor explained how finding balance between responsibilities at home and work place can be challenging, and lead to increased stress.  Counselor facilitated discussion on what challenges members are currently, or have historically faced.  Counselor also discussed strategies for improving work/life balance while members work on their mental health during treatment.  Some of these included keeping track of time management; creating a list of priorities and scaling importance; setting realistic, measurable goals each day; establishing boundaries; taking care of health needs; and nurturing relationships at home and work for support.  Counselor inquired about areas where members feel they are excelling, as well as areas they could focus on during treatment. Intervention was effective, as evidenced by Brian Campanile actively participating in discussion on topic and reporting that she often feels "Time poor" because of the demands of her work schedule.  Anshu reported that she is careful about what she shares at her job, and with whom, do to the feelings that profit is prioritized over people's wellbeing.  Blossom reported that she experienced several symptoms of burnout, including headaches, tired and drained, and lack of motivation.  Keilani reported that there have also been numerous warning signs such as spending an inordinate amount of time thinking about her job, feeling unhappy or resentful about the amount of time at work, and neglecting social relationships outside of the job.  Chanda was receptive to suggestions offered today for addressing work life imbalance, including exploring more outlets for stress management such as exercise, since her manager can make her very angry and lead to compartmentalization of feelings.    Assessment and Plan: Counselor recommends that Miela remain in IOP  treatment to better manage mental health symptoms, ensure stability and pursue completion of treatment plan goals. Counselor  recommends adherence to crisis/safety plan, taking medications as prescribed, and following up with medical professionals if any issues arise.    Follow Up Instructions: Counselor will send [employer] Teams link for session tomorrow.  Drisana was advised to call back or seek an in-person evaluation if the symptoms worsen or if the condition fails to improve as anticipated.   Collaboration of Care:   Medication Management AEB Dan Dun, NP or Dr. Marilou Showman                                          Case Manager AEB Molinda Angelica, CNA    Patient/Guardian was advised Release of Information must be obtained prior to any record release in order to collaborate their care with an outside provider. Patient/Guardian was advised if they have not already done so to contact the registration department to sign all necessary forms in order for us  to release information regarding their care.    Consent: Patient/Guardian gives verbal consent for treatment and assignment of benefits for services provided during this visit. Patient/Guardian expressed understanding and agreed to proceed.   I provided 180 minutes of non-face-to-face time during this encounter.   Desmond Florida, LCSW, LCAS 01/09/24

## 2024-01-10 ENCOUNTER — Telehealth (HOSPITAL_COMMUNITY): Payer: Self-pay | Admitting: Psychiatry

## 2024-01-10 ENCOUNTER — Other Ambulatory Visit (HOSPITAL_COMMUNITY)

## 2024-01-11 ENCOUNTER — Other Ambulatory Visit (HOSPITAL_COMMUNITY): Admitting: Licensed Clinical Social Worker

## 2024-01-11 DIAGNOSIS — F331 Major depressive disorder, recurrent, moderate: Secondary | ICD-10-CM

## 2024-01-11 NOTE — Progress Notes (Signed)
 Virtual Visit via Video Note   I connected with Sarah Fritz on 01/11/24 at  9:00 AM EDT by a video enabled telemedicine application and verified that I am speaking with the correct person using two identifiers.   At orientation to the IOP program, Case Manager discussed the limitations of evaluation and management by telemedicine and the availability of in person appointments. The patient expressed understanding and agreed to proceed with virtual visits throughout the duration of the program.   Location:  Patient: Patient Home Provider: [facility]   History of Present Illness: MDD    Observations/Objective: Check In: Case Manager checked in with all participants to review discharge dates, insurance authorizations, work-related documents and needs from the treatment team regarding medications. Sarah Fritz stated needs and engaged in discussion.    Initial Therapeutic Activity: Counselor facilitated a check-in with Sarah Fritz to assess for safety, sobriety and medication compliance.  Counselor also inquired about Laporsche's current emotional ratings, as well as any significant changes in thoughts, feelings or behavior since previous check in.  Sarah Fritz presented for session on time and was alert, oriented x5, with no evidence or self-report of active SI/HI or A/V H.  Sarah Fritz reported compliance with medication and denied use of alcohol or illicit substances.  Sarah Fritz reported scores of 6/10 for depression, 10/10 for anxiety, and 0/10 for anger/irritability.  Sarah Fritz denied any recent outbursts or panic attacks.  Sarah Fritz reported that a struggle was missing group yesterday and losing sleep due to a medical emergency with her fianc.  Sarah Fritz reported that she has been very worried because he has high blood pressure now, and is in 'stroke territory'.  Sarah Fritz reported that a success was doing her goddaughter's hair for prom as a distraction.  Sarah Fritz reported that her goal today is to get some rest since she didn't sleep well last  night.         Second Therapeutic Activity: Counselor introduced topic of assertive communication today.  Counselor shared various handouts with members virtually in group to read along with on the subject.  These handouts defined assertive communication as a communication style in which a person stands up for their own needs and wants, while also taking into consideration the needs and wants of others, without behaving in a passive or aggressive way.  Traits of assertive communicators were highlighted such as using appropriate speaking volume, maintaining eye contact, using confident language, and avoiding interruption.  Members were also provided with tips on how to improve communication, including respecting oneself, expressing thoughts and feelings calmly, and saying "No" when necessary.  Members were given a variety of scenarios where they could practice using these tips to respond in an assertive manner.  Intervention effectiveness was mixed, as evidenced by Sarah Fritz participating in discussion on topic, reporting that she has a passive communication style due to traits such as prioritizing the needs of others, being soft spoken and quiet, and lacking confidence.  Sarah Fritz reported that she believed this had to do with her upraising with an aggressive grandmother that was hard to speak up against.  Sarah Fritz was unable to participate further due to fatigue from lack of sleep, and asked to be excused from remainder of group at 10:40am.     Assessment and Plan: Counselor recommends that Sarah Fritz remain in IOP treatment to better manage mental health symptoms, ensure stability and pursue completion of treatment plan goals. Counselor recommends adherence to crisis/safety plan, taking medications as prescribed, and following up with medical professionals if any  issues arise.    Follow Up Instructions: Counselor will send [employer] Teams link for session tomorrow.  Sarah Fritz was advised to call back or seek an in-person  evaluation if the symptoms worsen or if the condition fails to improve as anticipated.   Collaboration of Care:   Medication Management AEB Dan Dun, NP or Dr. Marilou Showman                                          Case Manager AEB Molinda Angelica, CNA    Patient/Guardian was advised Release of Information must be obtained prior to any record release in order to collaborate their care with an outside provider. Patient/Guardian was advised if they have not already done so to contact the registration department to sign all necessary forms in order for us  to release information regarding their care.    Consent: Patient/Guardian gives verbal consent for treatment and assignment of benefits for services provided during this visit. Patient/Guardian expressed understanding and agreed to proceed.   I provided 100 minutes of non-face-to-face time during this encounter.   Desmond Florida, LCSW, LCAS 01/11/24

## 2024-01-12 ENCOUNTER — Other Ambulatory Visit (HOSPITAL_COMMUNITY): Admitting: Psychiatry

## 2024-01-12 DIAGNOSIS — F331 Major depressive disorder, recurrent, moderate: Secondary | ICD-10-CM

## 2024-01-12 NOTE — Progress Notes (Signed)
 Virtual Visit via Video Note   I connected with Sherin Dingwall on 01/12/24 at  9:00 AM EDT by a video enabled telemedicine application and verified that I am speaking with the correct person using two identifiers.   At orientation to the IOP program, Case Manager discussed the limitations of evaluation and management by telemedicine and the availability of in person appointments. The patient expressed understanding and agreed to proceed with virtual visits throughout the duration of the program.   Location:  Patient: Patient Home Provider: Home Office   History of Present Illness: MDD    Observations/Objective: Check In: Case Manager checked in with all participants to review discharge dates, insurance authorizations, work-related documents and needs from the treatment team regarding medications. Modine stated needs and engaged in discussion.    Initial Therapeutic Activity: Counselor facilitated a check-in with Carmella to assess for safety, sobriety and medication compliance.  Counselor also inquired about Vallie's current emotional ratings, as well as any significant changes in thoughts, feelings or behavior since previous check in.  Mandi presented for session on time and was alert, oriented x5, with no evidence or self-report of active SI/HI or A/V H.  Madaline reported compliance with medication and denied use of alcohol or illicit substances.  Chalese reported scores of 4/10 for depression, 2/10 for anxiety, and 0/10 for anger/irritability.  Zoey denied any recent outbursts or panic attacks.  Jaszmine denied any new struggles.  Viki reported that a success was getting some consistent sleep yesterday, which led to an improved mood this morning.  Thereasa reported that her goal this weekend is to get more rest, since she will be going to [employer] next week and anticipates this throwing off her sleep schedule.          Second Therapeutic Activity: Counselor discussed topic of sleep hygiene today with group.   Counselor defined this as the habits, behaviors and environmental factors that can be adjusted to improve overall sleep quality.  Counselor also discussed how lack of consistent sleep can negatively affect mood and overall mental health.  Counselor provided members with a screening to complete assessing typical barriers one might face in achieving quality sleep at night (i.e. struggling to get up in the morning, waking up throughout the night, tossing/turning, etc), and inquired about members' perception of sleep hygiene at present.  Counselor offered techniques to members via a virtual handout which could be implemented in order to improve sleep hygiene, such as sticking to a normal morning/night routine, avoiding using of electronics too close to bedtime, avoiding heavy meals before bed, using a sleep journal to track changes and address anxious thoughts, as well as avoiding naps during the daytime, ensuring proper use of medications if prescribed any by provider(s), and more.  Counselor inquired about changes members intend to make to sleep hygiene based upon information covered today.  Interventions were effective, as evidenced by Brian Campanile actively participating in discussion on subject, reporting that she has had a tendency to oversleep, and at one point was sleeping 24 hours a day with brief breaks for a snack or bathroom visit.  Ahliya reported that she used sleep as a form of escapism.  Jillien also completed a sleep assessment, which revealed that she is sleep deprived at present.  Lucette reported that she plans to improve sleep hygiene over the course of treatment by avoiding use of electronics too close to bedtime, cutting back on cigarettes to avoid interference with falling asleep, spending less time isolated in her  bedroom during the daytime, cutting back on naps, counting sheep to distract herself, running a fan for background sound at night, or taking a warm shower at bedtime to relax her.    Assessment and  Plan: Counselor recommends that Nyssa remain in IOP treatment to better manage mental health symptoms, ensure stability and pursue completion of treatment plan goals. Counselor recommends adherence to crisis/safety plan, taking medications as prescribed, and following up with medical professionals if any issues arise.    Follow Up Instructions: Counselor will send [employer] Teams link for session tomorrow.  Elanda was advised to call back or seek an in-person evaluation if the symptoms worsen or if the condition fails to improve as anticipated.   Collaboration of Care:   Medication Management AEB Dan Dun, NP or Dr. Marilou Showman                                          Case Manager AEB Molinda Angelica, CNA    Patient/Guardian was advised Release of Information must be obtained prior to any record release in order to collaborate their care with an outside provider. Patient/Guardian was advised if they have not already done so to contact the registration department to sign all necessary forms in order for us  to release information regarding their care.    Consent: Patient/Guardian gives verbal consent for treatment and assignment of benefits for services provided during this visit. Patient/Guardian expressed understanding and agreed to proceed.   I provided 180 minutes of non-face-to-face time during this encounter.   Desmond Florida, LCSW, LCAS 01/12/24

## 2024-01-15 ENCOUNTER — Other Ambulatory Visit (HOSPITAL_COMMUNITY): Attending: Psychiatry | Admitting: Psychiatry

## 2024-01-15 DIAGNOSIS — F331 Major depressive disorder, recurrent, moderate: Secondary | ICD-10-CM | POA: Insufficient documentation

## 2024-01-15 DIAGNOSIS — Z79899 Other long term (current) drug therapy: Secondary | ICD-10-CM | POA: Diagnosis not present

## 2024-01-15 NOTE — Progress Notes (Signed)
 Virtual Visit via Video Note   I connected with Sarah Fritz on 01/15/24 at  9:00 AM EDT by a video enabled telemedicine application and verified that I am speaking with the correct person using two identifiers.   At orientation to the IOP program, Case Manager discussed the limitations of evaluation and management by telemedicine and the availability of in person appointments. The patient expressed understanding and agreed to proceed with virtual visits throughout the duration of the program.   Location:  Patient: Patient Home Provider: [facility]   History of Present Illness: MDD    Observations/Objective: Check In: Case Manager checked in with all participants to review discharge dates, insurance authorizations, work-related documents and needs from the treatment team regarding medications. Sarah Fritz stated needs and engaged in discussion.    Initial Therapeutic Activity: Counselor facilitated a check-in with Sarah Fritz to assess for safety, sobriety and medication compliance.  Counselor also inquired about Sarah Fritz's current emotional ratings, as well as any significant changes in thoughts, feelings or behavior since previous check in.  Sarah Fritz presented for session on time and was alert, oriented x5, with no evidence or self-report of active SI/HI or A/V H.  Sarah Fritz reported compliance with medication and denied use of alcohol or illicit substances.  Sarah Fritz reported scores of 4/10 for depression, 4/10 for anxiety, and 0/10 for anger/irritability.  Sarah Fritz denied any recent outbursts or panic attacks.  Sarah Fritz reported that a success was helping her goddaughter prepare for prom over the weekend.  Sarah Fritz reported that a struggle was getting into an argument with her fianc when he invited some friends over and started drinking.  Sarah Fritz reported that her goal today is to fold clothes that have been piling up at home.          Second Therapeutic Activity: Counselor utilized a Cabin crew with group members today to  guide discussion on topic of codependency.  This handout defined codependency as excessive emotional or psychological reliance upon someone who requires support on account of an illness or addiction.  It also explained how this issue presents in dysfunctional family systems, including behavior such as denying existence of problems, rigid boundaries on communication, strained trust, lack of individuality, and reinforcement of unhealthy coping mechanisms such as substance use.  Characteristics of co-dependent people were listed for assistance with identification, such as extreme need for approval/recognition, difficulty identifying feelings, poor communication, and more.  Members were also tasked with completing a questionnaire in order to identify signs of codependency and results were discussed afterward.  This handout also offered strategies for resolving co-dependency within one's network, including increased use of assertive communication skills in order to set appropriate boundaries.  Intervention was effective, as evidenced by Sarah Fritz actively participating in discussion on the subject, and completing codependency questionnaire, with 13 out of 20 positive responses.  Sarah Fritz reported that she is currently struggling with codependency in her relationship, since her fianc appears to be dealing with alcoholism and gambling.  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  I don't understand how some people don't get help or acknowledge their issues".  She reported that her goal will be to carefully monitor substance use to avoid relapse on 'harder' choices such as cocaine, work on her communication skills in order to become more assertive, ask for help more quickly when its needed, and set boundaries with people in her life that might overwhelm her with their needs.    Assessment and  Plan: Counselor recommends that Sarah Fritz remain in IOP treatment to better manage mental health symptoms, ensure  stability and pursue completion of treatment plan goals. Counselor recommends adherence to crisis/safety plan, taking medications as prescribed, and following up with medical professionals if any issues arise.    Follow Up Instructions: Counselor will send [employer] Teams link for session tomorrow.  Sarah Fritz was advised to call back or seek an in-person evaluation if the symptoms worsen or if the condition fails to improve as anticipated.   Collaboration of Care:   Medication Management AEB Dan Dun, NP or Dr. Marilou Showman                                          Case Manager AEB Molinda Angelica, CNA    Patient/Guardian was advised Release of Information must be obtained prior to any record release in order to collaborate their care with an outside provider. Patient/Guardian was advised if they have not already done so to contact the registration department to sign all necessary forms in order for us  to release information regarding their care.    Consent: Patient/Guardian gives verbal consent for treatment and assignment of benefits for services provided during this visit. Patient/Guardian expressed understanding and agreed to proceed.   I provided 180 minutes of non-face-to-face time during this encounter.   Desmond Florida, LCSW, LCAS 01/15/24

## 2024-01-16 ENCOUNTER — Other Ambulatory Visit (HOSPITAL_COMMUNITY): Admitting: Licensed Clinical Social Worker

## 2024-01-16 DIAGNOSIS — F331 Major depressive disorder, recurrent, moderate: Secondary | ICD-10-CM | POA: Diagnosis not present

## 2024-01-16 NOTE — Progress Notes (Signed)
 Virtual Visit via Video Note   I connected with Sherin Dingwall on 01/16/24 at  9:00 AM EDT by a video enabled telemedicine application and verified that I am speaking with the correct person using two identifiers.   At orientation to the IOP program, Case Manager discussed the limitations of evaluation and management by telemedicine and the availability of in person appointments. The patient expressed understanding and agreed to proceed with virtual visits throughout the duration of the program.   Location:  Patient: Patient Home Provider: Home Office   History of Present Illness: MDD    Observations/Objective: Check In: Case Manager checked in with all participants to review discharge dates, insurance authorizations, work-related documents and needs from the treatment team regarding medications. Staley stated needs and engaged in discussion.    Initial Therapeutic Activity: Counselor facilitated a check-in with Lala to assess for safety, sobriety and medication compliance.  Counselor also inquired about Shaunita's current emotional ratings, as well as any significant changes in thoughts, feelings or behavior since previous check in.  Corby presented for session on time and was alert, oriented x5, with no evidence or self-report of active SI/HI or A/V H.  Tamela reported compliance with medication and denied use of alcohol or illicit substances.  Leatha reported scores of 4/10 for depression, 5/10 for anxiety, and 0/10 for anger/irritability.  Shamariah denied any recent outbursts or panic attacks.  Aliviyah reported that a struggle was experiencing a migraine yesterday, which prevented her from accomplishing chores she planned for.  Amely reported that a success was watching 2 movies for self-care while she rested.  Teriah reported that her goal today is to do some laundry and then do some organizing around the home if she isn't too tired.          Second Therapeutic Activity: Counselor introduced topic of  self-esteem today and defined this as the value an individual places on oneself, based upon assessment of personal worth as a human being and approval/disapproval of one's behavior. Counselor asked members to assess their level of self-esteem at this time based upon common indicators of high self-esteem, including: accepting oneself unconditionally;  having self-respect and deep seated belief that one matters; being unaffected by other people's opinions/criticisms; and showing good control over emotions.  Counselor also explained concept of one's inner critic which serves to highlight faults and minimize strengths, directly influencing low sense of self-esteem.  Counselor then provided handout on 'strengths and qualities', which featured questions to guide discussion and increase awareness of each member's unique individual abilities which could reinforce higher self-esteem. Examples of questions included: 'things I am good at', 'challenges I have overcome', and 'what I like about myself'.  Intervention was effective, as evidenced by Brian Campanile actively engaging in discussion on topic, and completing a self-esteem assessment, receiving a score of 100, which indicated a 'high' level of self-esteem at this time due to traits such as accepting who she is despite flaws, and feeling proud of her accomplishments.  Nyimah stated "I love myself.  I may have some struggles every now and then".  Aija was receptive to several strategies offered today for further increasing self-esteem during treatment, including working on her communication skills to avoid having outbursts that make her feel ashamed, prioritizing more time in her routine for self-care activities such as getting dressed up or having her nails done, and giving herself more credit for recent accomplishments such as setting boundaries in her support network.  Assessment and Plan: Counselor recommends that  Luciel remain in IOP treatment to better manage mental health  symptoms, ensure stability and pursue completion of treatment plan goals. Counselor recommends adherence to crisis/safety plan, taking medications as prescribed, and following up with medical professionals if any issues arise.    Follow Up Instructions: Counselor will send [employer] Teams link for session tomorrow.  Fantasha was advised to call back or seek an in-person evaluation if the symptoms worsen or if the condition fails to improve as anticipated.   Collaboration of Care:   Medication Management AEB Dan Dun, NP or Dr. Marilou Showman                                          Case Manager AEB Molinda Angelica, CNA    Patient/Guardian was advised Release of Information must be obtained prior to any record release in order to collaborate their care with an outside provider. Patient/Guardian was advised if they have not already done so to contact the registration department to sign all necessary forms in order for us  to release information regarding their care.    Consent: Patient/Guardian gives verbal consent for treatment and assignment of benefits for services provided during this visit. Patient/Guardian expressed understanding and agreed to proceed.   I provided 180 minutes of non-face-to-face time during this encounter.   Desmond [place], LCSW, LCAS 01/16/24

## 2024-01-17 ENCOUNTER — Other Ambulatory Visit (HOSPITAL_COMMUNITY): Admitting: Psychiatry

## 2024-01-17 ENCOUNTER — Telehealth (HOSPITAL_COMMUNITY): Payer: Self-pay | Admitting: Psychiatry

## 2024-01-18 ENCOUNTER — Telehealth (HOSPITAL_COMMUNITY): Payer: Self-pay | Admitting: Psychiatry

## 2024-01-18 ENCOUNTER — Other Ambulatory Visit (HOSPITAL_COMMUNITY): Admitting: Psychiatry

## 2024-01-19 ENCOUNTER — Other Ambulatory Visit (HOSPITAL_COMMUNITY)

## 2024-01-22 ENCOUNTER — Other Ambulatory Visit (HOSPITAL_COMMUNITY): Admitting: Licensed Clinical Social Worker

## 2024-01-22 DIAGNOSIS — F331 Major depressive disorder, recurrent, moderate: Secondary | ICD-10-CM | POA: Diagnosis not present

## 2024-01-22 NOTE — Progress Notes (Signed)
 Virtual Visit via Video Note   I connected with Sarah Fritz on 01/22/24 at  9:00 AM EDT by a video enabled telemedicine application and verified that I am speaking with the correct person using two identifiers.   At orientation to the IOP program, Case Manager discussed the limitations of evaluation and management by telemedicine and the availability of in person appointments. The patient expressed understanding and agreed to proceed with virtual visits throughout the duration of the program.   Location:  Patient: Patient Home Provider: [facility]   History of Present Illness: MDD    Observations/Objective: Check In: Case Manager checked in with all participants to review discharge dates, insurance authorizations, work-related documents and needs from the treatment team regarding medications. Sarah Fritz stated needs and engaged in discussion.    Initial Therapeutic Activity: Counselor facilitated a check-in with Sarah Fritz to assess for safety, sobriety and medication compliance.  Counselor also inquired about Sarah Fritz's current emotional ratings, as well as any significant changes in thoughts, feelings or behavior since previous check in.  Sarah Fritz presented for session on time and was alert, oriented x5, with no evidence or self-report of active SI/HI or A/V H.  Sarah Fritz reported compliance with medication and denied use of alcohol or illicit substances.  Sarah Fritz reported scores of 6/10 for depression, 10/10 for anxiety, and 10/10 for anger/irritability.  Sarah Fritz denied any recent outbursts or panic attacks.  Sarah Fritz reported that a success was going to [employer] with family over the weekend.  Sarah Fritz reported that a struggle was having her AC go out in the home yesterday, and feeling frustrated with the lack of response from management at her complex.  Sarah Fritz reported that her goal today is to get her [facility] fixed and do something special for her husband's birthday.          Second Therapeutic Activity: Counselor  introduced Amanda Davee Lomax, [facility] Chaplain to provide psychoeducation on topic of Grief and Loss with members today.  [name] began discussion by checking in with the group about their baseline mood today, general thoughts on what grief means to them and how it has affected them personally in the past.  [name] provided information on how the process of grief/loss can differ depending upon one's unique culture, and categories of loss one could experience (i.e. loss of a person, animal, relationship, job, identity, etc).  [name] encouraged members to be mindful of how pervasive loss can be, and how to recognize signs which could indicate that this is having an impact on one's overall mental health and wellbeing.  Intervention effectiveness was mixed, as client did answer an 'icebreaker' question posed by chaplain, but didn't participate in discussion on topic of grief.    Assessment and Plan: Counselor recommends that Sarah Fritz remain in IOP treatment to better manage mental health symptoms, ensure stability and pursue completion of treatment plan goals. Counselor recommends adherence to crisis/safety plan, taking medications as prescribed, and following up with medical professionals if any issues arise.    Follow Up Instructions: Counselor will send Microsoft Teams link for session tomorrow.  Sarah Fritz was advised to call back or seek an in-person evaluation if the symptoms worsen or if the condition fails to improve as anticipated.   Collaboration of Care:   Medication Management AEB Dan Dun, NP or Dr. Marilou Showman  Case Manager AEB Molinda Angelica, CNA    Patient/Guardian was advised Release of Information must be obtained prior to any record release in order to collaborate their care with an outside provider. Patient/Guardian was advised if they have not already done so to contact the registration department to sign all necessary forms in order for us  to release  information regarding their care.    Consent: Patient/Guardian gives verbal consent for treatment and assignment of benefits for services provided during this visit. Patient/Guardian expressed understanding and agreed to proceed.   I provided 180 minutes of non-face-to-face time during this encounter.   Desmond Florida, LCSW, LCAS 01/22/24

## 2024-01-23 ENCOUNTER — Other Ambulatory Visit (HOSPITAL_COMMUNITY): Admitting: Psychiatry

## 2024-01-23 ENCOUNTER — Encounter (HOSPITAL_COMMUNITY): Payer: Self-pay | Admitting: Family

## 2024-01-23 DIAGNOSIS — F331 Major depressive disorder, recurrent, moderate: Secondary | ICD-10-CM | POA: Diagnosis not present

## 2024-01-23 MED ORDER — HYDROXYZINE PAMOATE 25 MG PO CAPS: 25.0000 mg | ORAL_CAPSULE | Freq: Every evening | ORAL | 0 refills | Status: DC | PRN

## 2024-01-23 NOTE — Progress Notes (Signed)
 Virtual Visit via Video Note   I connected with Sarah Fritz on 01/23/24 at  9:00 AM EDT by a video enabled telemedicine application and verified that I am speaking with the correct person using two identifiers.   At orientation to the IOP program, Case Manager discussed the limitations of evaluation and management by telemedicine and the availability of in person appointments. The patient expressed understanding and agreed to proceed with virtual visits throughout the duration of the program.   Location:  Patient: Patient Home Provider: [facility]   History of Present Illness: MDD    Observations/Objective: Check In: Case Manager checked in with all participants to review discharge dates, insurance authorizations, work-related documents and needs from the treatment team regarding medications. Sarah Fritz stated needs and engaged in discussion.    Initial Therapeutic Activity: Counselor facilitated a check-in with Sarah Fritz to assess for safety, sobriety and medication compliance.  Counselor also inquired about Sarah Fritz's current emotional ratings, as well as any significant changes in thoughts, feelings or behavior since previous check in.  Sarah Fritz presented for session on time and was alert, oriented x5, with no evidence or self-report of active SI/HI or A/V H.  Sarah Fritz reported compliance with medication and denied use of alcohol or illicit substances.  Sarah Fritz reported scores of 4/10 for depression, 6/10 for anxiety, and 0/10 for anger/irritability.  Sarah Fritz denied any recent outbursts or panic attacks.  Sarah Fritz reported that a struggle was dealing with the heat yesterday when her [facility] went out.  Sarah Fritz reported that a success was eventually having maintenance come out and fix it.  Sarah Fritz reported that her goal today is to do her cousin's hair for an upcoming graduation.          Second Therapeutic Activity: Counselor covered topic of distress tolerance skills today.  Counselor utilized a DBT handout which explained how  distressing situations don't always have quick solutions, so the only choice is to sit with uncomfortable emotions until they pass.  Counselor offered the IMPROVE acronym as a solution to this problem, which outlined various skills (i.e. Imagery, Meaning, Prayer, Relaxation, 'One thing in the moment', Vacation, and Encouragement) that could be explored in order to improve ability to tolerate discomfort.  Counselor tasked members with identifying personalized strategies for each category which could have been implemented to handle a recent challenge more effectively.  Intervention was effective, as evidenced by Sarah Fritz actively engaging in discussion on subject, reporting that losing her [facility] yesterday was a distressing situation, but by reflecting on it today she was able to identify several strategies for handling a similar struggle in the future, including watching a funny show, reflecting upon things she can be grateful for in her life, praying to her higher power, or cooking a nice meal for herself.  Assessment and Plan: Counselor recommends that Sarah Fritz remain in IOP treatment to better manage mental health symptoms, ensure stability and pursue completion of treatment plan goals. Counselor recommends adherence to crisis/safety plan, taking medications as prescribed, and following up with medical professionals if any issues arise.    Follow Up Instructions: Counselor will send Microsoft Teams link for session tomorrow.  Sarah Fritz was advised to call back or seek an in-person evaluation if the symptoms worsen or if the condition fails to improve as anticipated.   Collaboration of Care:   Medication Management AEB Dan Dun, NP or Dr. Marilou Showman  Case Manager AEB Molinda Angelica, CNA    Patient/Guardian was advised Release of Information must be obtained prior to any record release in order to collaborate their care with an outside provider. Patient/Guardian was advised if  they have not already done so to contact the registration department to sign all necessary forms in order for us  to release information regarding their care.    Consent: Patient/Guardian gives verbal consent for treatment and assignment of benefits for services provided during this visit. Patient/Guardian expressed understanding and agreed to proceed.   I provided 180 minutes of non-face-to-face time during this encounter.   Desmond Florida, LCSW, LCAS 01/23/24

## 2024-01-23 NOTE — Progress Notes (Signed)
 Reported concerns with sleep disturbance.  Initiated hydroxyzine 25 to 50 mg p.o. nightly as needed.  Patient to discontinue utilizing muscle relaxant for sleep.  Will follow-up 6/12 for medication management/tolerability

## 2024-01-24 ENCOUNTER — Other Ambulatory Visit (HOSPITAL_COMMUNITY)

## 2024-01-25 ENCOUNTER — Other Ambulatory Visit (HOSPITAL_COMMUNITY)

## 2024-01-26 ENCOUNTER — Other Ambulatory Visit (HOSPITAL_COMMUNITY)

## 2024-01-29 ENCOUNTER — Telehealth (HOSPITAL_COMMUNITY): Payer: Self-pay | Admitting: Psychiatry

## 2024-01-29 ENCOUNTER — Other Ambulatory Visit (HOSPITAL_COMMUNITY): Admitting: Psychiatry

## 2024-01-30 ENCOUNTER — Other Ambulatory Visit (HOSPITAL_COMMUNITY)

## 2024-01-30 ENCOUNTER — Telehealth (HOSPITAL_COMMUNITY): Payer: Self-pay | Admitting: Psychiatry

## 2024-01-31 ENCOUNTER — Other Ambulatory Visit (HOSPITAL_COMMUNITY)

## 2024-01-31 ENCOUNTER — Telehealth (HOSPITAL_COMMUNITY): Payer: Self-pay | Admitting: Psychiatry

## 2024-01-31 NOTE — Telephone Encounter (Signed)
 D:  Patient didn't show for group or call the MH-IOP case mgr today either.  A:  Placed call and left vm requesting pt to call the case mgr back to inform if she would like to continue in group or to be discharged?  Inform treatment team.

## 2024-01-31 NOTE — Telephone Encounter (Signed)
 D:  Pt sent the MH-IOP Case Mgr an email stating she didn't receive the invites for the combined groups last week and she ended up having to go out of town d/t a family emergency.  Reports she doesn't know when she will be returning.  Pt is requesting discharge from MH-IOP.  A:  Will discharge pt.  Inform treatment team.  F/U with Dan Dun, NP and pt was provided with a therapist list.  R:  Pt receptive.

## 2024-02-01 ENCOUNTER — Other Ambulatory Visit (HOSPITAL_COMMUNITY): Admitting: Psychiatry

## 2024-02-01 ENCOUNTER — Encounter (HOSPITAL_COMMUNITY): Payer: Self-pay | Admitting: Psychiatry

## 2024-02-01 ENCOUNTER — Encounter (HOSPITAL_COMMUNITY): Payer: Self-pay

## 2024-02-01 NOTE — Progress Notes (Signed)
 Virtual Visit via Video Note  I connected with Sarah Fritz on @TODAY @ at  9:00 AM EDT by a video enabled telemedicine application and verified that I am speaking with the correct person using two identifiers.  Location: Patient: at home Provider: at office   I discussed the limitations of evaluation and management by telemedicine and the availability of in person appointments. The patient expressed understanding and agreed to proceed.  I discussed the assessment and treatment plan with the patient. The patient was provided an opportunity to ask questions and all were answered. The patient agreed with the plan and demonstrated an understanding of the instructions.   The patient was advised to call back or seek an in-person evaluation if the symptoms worsen or if the condition fails to improve as anticipated.  I provided 15 minutes of non-face-to-face time during this encounter.   Molinda Angelica, M.Ed,CNA   Patient ID: Sarah Fritz, female   DOB: 1981-04-11, 43 y.o.   MRN: [id number] D:  As per previous CCA states: This is a 43 yr old, divorced, employed, AA -female who was referred to virtual MH-IOP per Dan Dun, NP; treatment for worsening depressive symptoms. Pt denies SI/HI or A/V hallucinations. States she has been out on short term disability since November 21, 2023. Pt reports sx's started worsening in October 2024. Trigger/Stressors: 1) Unresolved grief/loss issues: 1) Father died of cancer on 06-22-2023; then P-Uncle died in 04/11/25d/t cancer also. 2) Job [employer]) of six yrs. States although she loves her job, it's been difficult to function while hearing customers call in about their cancer dx's. 3) 44 yr old goddaughter who patient has brought into her home, since August 2024; d/t poor living conditions she was residing in. Her mother is transgendering to female. The 44 yr old is acting out in school and fighting. 4) Medical Issues: Graves Disease and Thyroid  Issues. Pt denies  any prior psychiatric hospitalizations or attempts/gestures. Seen [facility] for the first time yesterday, but hx of seeing a therapist but she moved away, so pt will need a new therapist. Hx of outpt services through the [facility] back in 2013. They diagnosed me with Schizophrenia, but I think they were wrong b/c I wasn't hearing voices or seeing things. Family Hx: Mom (Depression/Anxiety) and PGF was at [facility] where he died.  Pt only attended nine days out of nineteen.  Pt sent the MH-IOP Case Mgr an email stating she didn't receive the invites for the combined groups last week and she ended up having to go out of town d/t a family emergency. Reports she doesn't know when she will be returning. Pt is requesting discharge from MH-IOP.  Denies SI/HI or A/V hallucinations. A:  Discharge today.  F/U with Dan Dun, NP and patient was already provided with a therapist list to call.  RTW when [facility] states; without any restrictions.  Strongly encouraged support groups. Pt was advised of ROI must be obtained prior to any records release in order to collaborate her care with an outside provider.  Pt was advised if she has not already done so to contact the front desk to sign all necessary forms in order for MH-IOP to release info re: her care.  Consent:  Pt gives verbal consent for tx and assignment of benefits for services provided during this telehealth group process.  Pt expressed understanding and agreed to proceed. Collaboration of care:  Collaborate with Dan Dun, NP AEB; Dr. Marilou Showman AEB; and Desmond Florida,  LCSW AEB.  R:  Pt receptive.  Molinda Angelica, M.Ed,CNA

## 2024-02-02 NOTE — Telephone Encounter (Signed)
 D:  Patient didn't show for virtual MH-IOP again today. A: Placed a call and left vm requesting pt to call the case manager. Inform treatment team.

## 2024-02-02 NOTE — Progress Notes (Signed)
 Virtual Visit via Video Note  I connected with Sarah Fritz on 02/02/24 at  9:00 AM EDT by a video enabled telemedicine application and verified that I am speaking with the correct person using two identifiers.  Location: Patient: Home Provider: Office   I discussed the limitations of evaluation and management by telemedicine and the availability of in person appointments. The patient expressed understanding and agreed to proceed.    I discussed the assessment and treatment plan with the patient. The patient was provided an opportunity to ask questions and all were answered. The patient agreed with the plan and demonstrated an understanding of the instructions.   The patient was advised to call back or seek an in-person evaluation if the symptoms worsen or if the condition fails to improve as anticipated.  I provided 15 minutes of non-face-to-face time during this encounter.   Levester Reagin, NP   [facility] Intensive Outpatient Program Discharge Summary  Sarah Fritz [id number]  Admission date: 01/04/2024 Discharge date: 01/22/2024  Reason for admission: Per admission assessment note: Sarah Fritz is a 43 year old African-American female who presents with multiple stressors related to grief and loss, family strain, increased depression and workplace instability.  Patient carries a diagnosis related to depression, bipolar disorder, generalized anxiety disorder.  Recently seen and evaluated by this provider.  Discussed titrating Lexapro  10 mg to 20 mg daily and patient to start intensive outpatient programming on 01/04/2024.   Progress in Program Toward Treatment Goals:  Not progressing-patient attended and participated with daily group sessions sporadically.  Was reported last session was 01/23/2024 patient to be discontinued from intensive outpatient programming due to attendance.  Will follow-up with this provider on 02/04/2024 as scheduled.  Progress  (rationale): Take all of you medications as prescribed by your mental healthcare provider.  Report any adverse effects and reactions from your medications to your outpatient provider promptly.  Do not engage in alcohol and or illegal drug use while on prescription medicines. Keep all scheduled appointments. This is to ensure that you are getting refills on time and to avoid any interruption in your medication.  If you are unable to keep an appointment call to reschedule.  Be sure to follow up with resources and follow ups given. In the event of worsening symptoms call the crisis hotline, 911, and or go to the nearest emergency department for appropriate evaluation and treatment of symptoms. Follow-up with your primary care provider for your medical issues, concerns and or health care needs.    Collaboration of Care: Other continue with outpatient individual therapy and psychiatry services  Patient/Guardian was advised Release of Information must be obtained prior to any record release in order to collaborate their care with an outside provider. Patient/Guardian was advised if they have not already done so to contact the registration department to sign all necessary forms in order for us  to release information regarding their care.   Consent: Patient/Guardian gives verbal consent for treatment and assignment of benefits for services provided during this visit. Patient/Guardian expressed understanding and agreed to proceed.   Dan Dun NP 02/02/2024

## 2024-02-05 ENCOUNTER — Ambulatory Visit (HOSPITAL_COMMUNITY): Admitting: Family

## 2024-02-26 ENCOUNTER — Ambulatory Visit: Admitting: Emergency Medicine

## 2024-03-11 ENCOUNTER — Encounter: Payer: Self-pay | Admitting: Emergency Medicine

## 2024-03-11 NOTE — Telephone Encounter (Signed)
 Decision to make an appointment depends on her severity of symptoms.  Entirely up to her.  Thanks.

## 2024-03-13 ENCOUNTER — Encounter: Admitting: Emergency Medicine

## 2024-04-03 ENCOUNTER — Ambulatory Visit (INDEPENDENT_AMBULATORY_CARE_PROVIDER_SITE_OTHER): Admitting: Emergency Medicine

## 2024-04-03 ENCOUNTER — Encounter: Payer: Self-pay | Admitting: Emergency Medicine

## 2024-04-03 ENCOUNTER — Ambulatory Visit (INDEPENDENT_AMBULATORY_CARE_PROVIDER_SITE_OTHER)

## 2024-04-03 VITALS — BP 140/100 | HR 101 | Temp 99.6°F | Ht 64.0 in | Wt 140.0 lb

## 2024-04-03 DIAGNOSIS — Z0001 Encounter for general adult medical examination with abnormal findings: Secondary | ICD-10-CM

## 2024-04-03 DIAGNOSIS — Z1211 Encounter for screening for malignant neoplasm of colon: Secondary | ICD-10-CM

## 2024-04-03 DIAGNOSIS — F17200 Nicotine dependence, unspecified, uncomplicated: Secondary | ICD-10-CM

## 2024-04-03 DIAGNOSIS — Z13228 Encounter for screening for other metabolic disorders: Secondary | ICD-10-CM

## 2024-04-03 DIAGNOSIS — Z130 Encounter for screening for diseases of the blood and blood-forming organs and certain disorders involving the immune mechanism: Secondary | ICD-10-CM

## 2024-04-03 DIAGNOSIS — E890 Postprocedural hypothyroidism: Secondary | ICD-10-CM

## 2024-04-03 DIAGNOSIS — Z77120 Contact with and (suspected) exposure to mold (toxic): Secondary | ICD-10-CM | POA: Diagnosis not present

## 2024-04-03 DIAGNOSIS — Z1329 Encounter for screening for other suspected endocrine disorder: Secondary | ICD-10-CM

## 2024-04-03 DIAGNOSIS — I10 Essential (primary) hypertension: Secondary | ICD-10-CM | POA: Diagnosis not present

## 2024-04-03 DIAGNOSIS — Z800 Family history of malignant neoplasm of digestive organs: Secondary | ICD-10-CM

## 2024-04-03 DIAGNOSIS — Z13220 Encounter for screening for lipoid disorders: Secondary | ICD-10-CM | POA: Diagnosis not present

## 2024-04-03 LAB — HEMOGLOBIN A1C: Hgb A1c MFr Bld: 5.9 % (ref 4.6–6.5)

## 2024-04-03 LAB — CBC WITH DIFFERENTIAL/PLATELET
Basophils Absolute: 0.1 K/uL (ref 0.0–0.1)
Basophils Relative: 1.2 % (ref 0.0–3.0)
Eosinophils Absolute: 0.4 K/uL (ref 0.0–0.7)
Eosinophils Relative: 6.7 % — ABNORMAL HIGH (ref 0.0–5.0)
HCT: 36.5 % (ref 36.0–46.0)
Hemoglobin: 11.7 g/dL — ABNORMAL LOW (ref 12.0–15.0)
Lymphocytes Relative: 30 % (ref 12.0–46.0)
Lymphs Abs: 1.7 K/uL (ref 0.7–4.0)
MCHC: 32.2 g/dL (ref 30.0–36.0)
MCV: 74.2 fl — ABNORMAL LOW (ref 78.0–100.0)
Monocytes Absolute: 0.5 K/uL (ref 0.1–1.0)
Monocytes Relative: 9 % (ref 3.0–12.0)
Neutro Abs: 3.1 K/uL (ref 1.4–7.7)
Neutrophils Relative %: 53.1 % (ref 43.0–77.0)
Platelets: 239 K/uL (ref 150.0–400.0)
RBC: 4.91 Mil/uL (ref 3.87–5.11)
RDW: 16.8 % — ABNORMAL HIGH (ref 11.5–15.5)
WBC: 5.8 K/uL (ref 4.0–10.5)

## 2024-04-03 LAB — COMPREHENSIVE METABOLIC PANEL WITH GFR
ALT: 13 U/L (ref 0–35)
AST: 13 U/L (ref 0–37)
Albumin: 4.4 g/dL (ref 3.5–5.2)
Alkaline Phosphatase: 50 U/L (ref 39–117)
BUN: 7 mg/dL (ref 6–23)
CO2: 28 meq/L (ref 19–32)
Calcium: 9.1 mg/dL (ref 8.4–10.5)
Chloride: 104 meq/L (ref 96–112)
Creatinine, Ser: 0.77 mg/dL (ref 0.40–1.20)
GFR: 94.25 mL/min (ref >60.00)
Glucose, Bld: 75 mg/dL (ref 70–99)
Potassium: 3.8 meq/L (ref 3.5–5.1)
Sodium: 138 meq/L (ref 135–145)
Total Bilirubin: 0.8 mg/dL (ref 0.2–1.2)
Total Protein: 7.6 g/dL (ref 6.0–8.3)

## 2024-04-03 LAB — LIPID PANEL
Cholesterol: 180 mg/dL (ref 0–200)
HDL: 44.1 mg/dL (ref >39.00)
LDL Cholesterol: 107 mg/dL — ABNORMAL HIGH (ref 0–99)
NonHDL: 136.26
Total CHOL/HDL Ratio: 4
Triglycerides: 145 mg/dL (ref 0.0–149.0)
VLDL: 29 mg/dL (ref 0.0–40.0)

## 2024-04-03 LAB — TSH: TSH: 1.78 u[IU]/mL (ref 0.35–5.50)

## 2024-04-03 MED ORDER — VALSARTAN 40 MG PO TABS: 40.0000 mg | ORAL_TABLET | Freq: Every day | ORAL | 3 refills | Status: AC

## 2024-04-03 NOTE — Assessment & Plan Note (Signed)
 BP Readings from Last 3 Encounters:  04/03/24 (!) 140/100  12/26/23 (!) 159/99  11/27/23 (!) 142/78  Elevated blood pressure reading in the office today States she has been off her medication Continue valsartan  40 mg daily Advised to monitor blood pressure readings at home and if persistently elevated increase valsartan  to 80 mg daily Cardiovascular risks associated with uncontrolled hypertension discussed Dietary approaches to stop hypertension discussed Benefits of exercise discussed

## 2024-04-03 NOTE — Patient Instructions (Signed)

## 2024-04-03 NOTE — Assessment & Plan Note (Signed)
Needs screening colonoscopy

## 2024-04-03 NOTE — Progress Notes (Signed)
 Sarah Fritz 43 y.o.   Chief Complaint  Patient presents with   Annual Exam    Patient here for physical and discuss her mold concerns. She had the state inspectors come test the mold 3 weeks ago and is still waiting on results. She believes the mold is causing her the head aches, wheezing, runny nose.     HISTORY OF PRESENT ILLNESS: This is a 43 y.o. female A1A here for annual exam. Still smoking. Concerned about mold exposure at home No other complaints or medical concerns today.  HPI   Prior to Admission medications   Medication Sig Start Date End Date Taking? Authorizing Provider  cyclobenzaprine  (FLEXERIL ) 10 MG tablet TAKE 1 TABLET(10 MG) BY MOUTH AT BEDTIME Patient taking differently: Take 10 mg by mouth as needed. 12/15/23  Yes Williams, Megan E, NP  escitalopram  (LEXAPRO ) 20 MG tablet Take 0.5 tablets (10 mg total) by mouth daily. 01/01/24  Yes Ezzard Staci SAILOR, NP  levothyroxine  (SYNTHROID ) 50 MCG tablet Take 1 tablet (50 mcg total) by mouth daily before breakfast. 06/22/22  Yes Saori Umholtz, Emil Schanz, MD  valsartan  (DIOVAN ) 40 MG tablet TAKE 1 TABLET(40 MG) BY MOUTH DAILY 07/16/23  Yes Ambre Kobayashi, Emil Schanz, MD  Vitamin D , Ergocalciferol , (DRISDOL ) 1.25 MG (50000 UNIT) CAPS capsule Take 1 capsule by mouth once a week. 04/21/23  Yes [provider]  benzonatate  (TESSALON ) 200 MG capsule Take 1 capsule (200 mg total) by mouth 3 (three) times daily as needed. Patient not taking: Reported on 04/03/2024 12/26/23   Mayer, Jodi R, NP  hydrOXYzine  (VISTARIL ) 25 MG capsule Take 1 capsule (25 mg total) by mouth at bedtime and may repeat dose one time if needed. Patient not taking: Reported on 04/03/2024 01/23/24   Ezzard Staci SAILOR, NP  ipratropium (ATROVENT ) 0.03 % nasal spray Place 2 sprays into both nostrils every 12 (twelve) hours as needed (congestion). Patient not taking: Reported on 04/03/2024 12/26/23   Mayer, Jodi R, NP  levocetirizine (XYZAL ) 5 MG tablet Take 1 tablet (5 mg  total) by mouth every evening. Patient not taking: Reported on 04/03/2024 11/27/23   Purcell Emil Schanz, MD    Allergies  Allergen Reactions   Percocet [Oxycodone-Acetaminophen] Nausea And Vomiting and Other (See Comments)    Sweating, pass out   Vicodin [Hydrocodone-Acetaminophen] Nausea And Vomiting and Other (See Comments)    Sweating, pass out    Patient Active Problem List   Diagnosis Date Noted   Moderate episode of recurrent major depressive disorder (HCC) 11/27/2023   Sciatica, left side 04/27/2023   Vitamin D  deficiency 04/27/2023   Leg cramps 04/20/2023   Stress and adjustment reaction 10/25/2022   Mass of upper inner quadrant of left breast 10/25/2022   Family history of colon cancer in father 10/25/2022   Essential hypertension 06/22/2022   Anemia 03/09/2022   Heavy menses 03/07/2022   Hypertension 03/01/2022   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   Graves disease 07/27/2016   Hyperthyroidism 07/14/2016   Chronic pain syndrome 07/14/2016   Smoker 07/14/2016   Recurrent UTI 04/18/2013    Past Medical History:  Diagnosis Date   Abscess    Anemia    Chlamydia    Depression    Gonorrhea    Graves disease    Hypertension     Past Surgical History:  Procedure Laterality Date   WISDOM TOOTH EXTRACTION      Social History   Socioeconomic History   Marital status: Single    Spouse name: Not on  file   Number of children: Not on file   Years of education: Not on file   Highest education level: Associate degree: occupational, Scientist, product/process development, or vocational program  Occupational History   Not on file  Tobacco Use   Smoking status: Every Day    Current packs/day: 0.50    Types: Cigarettes   Smokeless tobacco: Current  Vaping Use   Vaping status: Former  Substance and Sexual Activity   Alcohol use: Yes    Comment: holidays   Drug use: Yes    Types: Marijuana    Comment: daily   Sexual activity: Yes    Birth control/protection: None  Other Topics  Concern   Not on file  Social History Narrative   Not on file   Social Drivers of Health   Financial Resource Strain: Low Risk  (04/02/2024)   Overall Financial Resource Strain (CARDIA)    Difficulty of Paying Living Expenses: Not hard at all  Food Insecurity: No Food Insecurity (04/02/2024)   Hunger Vital Sign    Worried About Running Out of Food in the Last Year: Never true    Ran Out of Food in the Last Year: Never true  Transportation Needs: No Transportation Needs (04/02/2024)   PRAPARE - Administrator, Civil Service (Medical): No    Lack of Transportation (Non-Medical): No  Physical Activity: Insufficiently Active (04/02/2024)   Exercise Vital Sign    Days of Exercise per Week: 2 days    Minutes of Exercise per Session: 10 min  Stress: Stress Concern Present (04/02/2024)   [employer] of Occupational Health - Occupational Stress Questionnaire    Feeling of Stress: To some extent  Social Connections: Moderately Integrated (04/02/2024)   Social Connection and Isolation Panel    Frequency of Communication with Friends and Family: Three times a week    Frequency of Social Gatherings with Friends and Family: Twice a week    Attends Religious Services: 1 to 4 times per year    Active Member of [employer] or Organizations: No    Attends Engineer, structural: Not on file    Marital Status: Living with partner  Intimate Partner Violence: Not on file    Family History  Problem Relation Age of Onset   Anxiety disorder Mother    Depression Mother    Heart disease Mother    Kidney disease Mother    COPD Mother    Cancer Mother    Cancer Father    COPD Father      Review of Systems  Constitutional: Negative.  Negative for chills and fever.  HENT: Negative.  Negative for congestion and sore throat.   Respiratory: Negative.  Negative for cough and shortness of breath.   Cardiovascular: Negative.  Negative for chest pain and palpitations.  Gastrointestinal:   Negative for abdominal pain, diarrhea, nausea and vomiting.  Genitourinary: Negative.  Negative for dysuria and hematuria.  Skin: Negative.  Negative for rash.  Neurological: Negative.  Negative for dizziness and headaches.  All other systems reviewed and are negative.   Vitals:   04/03/24 1519  BP: (!) 140/100  Pulse: (!) 101  Temp: 99.6 F (37.6 C)  SpO2: 98%    Physical Exam Vitals reviewed.  Constitutional:      Appearance: Normal appearance.  HENT:     Head: Normocephalic.     Right Ear: Tympanic membrane, ear canal and external ear normal.     Left Ear: Tympanic membrane, ear canal and  external ear normal.     Mouth/Throat:     Mouth: Mucous membranes are moist.     Pharynx: Oropharynx is clear.  Eyes:     Extraocular Movements: Extraocular movements intact.     Conjunctiva/sclera: Conjunctivae normal.     Pupils: Pupils are equal, round, and reactive to light.  Cardiovascular:     Rate and Rhythm: Normal rate and regular rhythm.     Pulses: Normal pulses.     Heart sounds: Normal heart sounds.  Pulmonary:     Effort: Pulmonary effort is normal.     Breath sounds: Normal breath sounds.  Abdominal:     Palpations: Abdomen is soft.     Tenderness: There is no abdominal tenderness.  Musculoskeletal:     Cervical back: No tenderness.  Lymphadenopathy:     Cervical: No cervical adenopathy.  Neurological:     General: No focal deficit present.     Mental Status: She is alert and oriented to person, place, and time.  Psychiatric:        Mood and Affect: Mood normal.        Behavior: Behavior normal.    DG Chest 2 View Result Date: 04/03/2024 CLINICAL DATA:  1 month history of cough fluid EXAM: CHEST - 2 VIEW COMPARISON:  None Available. FINDINGS: The heart size and mediastinal contours are within normal limits. Both lungs are clear. The visualized skeletal structures are unremarkable. IMPRESSION: No active cardiopulmonary disease. Electronically Signed   By: Camellia Candle M.D.   On: 04/03/2024 16:07     ASSESSMENT & PLAN: Problem List Items Addressed This Visit       Cardiovascular and Mediastinum   Essential hypertension   BP Readings from Last 3 Encounters:  04/03/24 (!) 140/100  12/26/23 (!) 159/99  11/27/23 (!) 142/78  Elevated blood pressure reading in the office today States she has been off her medication Continue valsartan  40 mg daily Advised to monitor blood pressure readings at home and if persistently elevated increase valsartan  to 80 mg daily Cardiovascular risks associated with uncontrolled hypertension discussed Dietary approaches to stop hypertension discussed Benefits of exercise discussed       Relevant Medications   valsartan  (DIOVAN ) 40 MG tablet   Other Relevant Orders   CBC with Differential/Platelet   Comprehensive metabolic panel with GFR   Hemoglobin A1c   Lipid panel     Endocrine   Postablative hypothyroidism   Clinically euthyroid TSH done today Continue Synthroid  50 mcg daily      Relevant Orders   TSH     Other   Smoker   Cardiovascular and cancer risks associated with smoking discussed. Smoking cessation advice given. States she is smoking less. Recommend chest x-ray today      Relevant Orders   DG Chest 2 View   Family history of colon cancer in father   Needs screening colonoscopy      Relevant Orders   Ambulatory referral to Gastroenterology   Mold exposure   Awaiting results of home testing for mold Has chronic allergic symptoms that could be related to this exposure Recommend blood work today May need evaluation by allergist.      Relevant Orders   Allergy Panel 11, Mold [facility]   Other Visit Diagnoses       Encounter for general adult medical examination with abnormal findings    -  Primary   Relevant Orders   CBC with Differential/Platelet   Comprehensive metabolic panel with GFR  Hemoglobin A1c   Lipid panel   TSH     Screening for colon cancer       Relevant  Orders   Ambulatory referral to Gastroenterology     Screening for deficiency anemia       Relevant Orders   CBC with Differential/Platelet     Screening for lipoid disorders       Relevant Orders   Lipid panel     Screening for endocrine, metabolic and immunity disorder       Relevant Orders   Comprehensive metabolic panel with GFR   Hemoglobin A1c      Modifiable risk factors discussed with patient. Anticipatory guidance according to age provided. The following topics were also discussed: Social Determinants of Health Smoking and cardiovascular risk associated with this condition Diet and nutrition Benefits of exercise Cancer screening and need for colon cancer screening given family history in first-degree relative Vaccinations review and recommendations Cardiovascular risk assessment and need for blood work Mental health including depression and anxiety Fall and accident prevention  Patient Instructions  Health Maintenance, Female Adopting a healthy lifestyle and getting preventive care are important in promoting health and wellness. Ask your health care provider about: The right schedule for you to have regular tests and exams. Things you can do on your own to prevent diseases and keep yourself healthy. What should I know about diet, weight, and exercise? Eat a healthy diet  Eat a diet that includes plenty of vegetables, fruits, low-fat dairy products, and lean protein. Do not eat a lot of foods that are high in solid fats, added sugars, or sodium. Maintain a healthy weight Body mass index (BMI) is used to identify weight problems. It estimates body fat based on height and weight. Your health care provider can help determine your BMI and help you achieve or maintain a healthy weight. Get regular exercise Get regular exercise. This is one of the most important things you can do for your health. Most adults should: Exercise for at least 150 minutes each week. The exercise  should increase your heart rate and make you sweat (moderate-intensity exercise). Do strengthening exercises at least twice a week. This is in addition to the moderate-intensity exercise. Spend less time sitting. Even light physical activity can be beneficial. Watch cholesterol and blood lipids Have your blood tested for lipids and cholesterol at 44 years of age, then have this test every 5 years. Have your cholesterol levels checked more often if: Your lipid or cholesterol levels are high. You are older than 44 years of age. You are at high risk for heart disease. What should I know about cancer screening? Depending on your health history and family history, you may need to have cancer screening at various ages. This may include screening for: Breast cancer. Cervical cancer. Colorectal cancer. Skin cancer. Lung cancer. What should I know about heart disease, diabetes, and high blood pressure? Blood pressure and heart disease High blood pressure causes heart disease and increases the risk of stroke. This is more likely to develop in people who have high blood pressure readings or are overweight. Have your blood pressure checked: Every 3-5 years if you are 26-57 years of age. Every year if you are 73 years old or older. Diabetes Have regular diabetes screenings. This checks your fasting blood sugar level. Have the screening done: Once every three years after age 43 if you are at a normal weight and have a low risk for diabetes. More often and at  a younger age if you are overweight or have a high risk for diabetes. What should I know about preventing infection? Hepatitis B If you have a higher risk for hepatitis B, you should be screened for this virus. Talk with your health care provider to find out if you are at risk for hepatitis B infection. Hepatitis C Testing is recommended for: Everyone born from [birth year] through 1966. Anyone with known risk factors for hepatitis C. Sexually  transmitted infections (STIs) Get screened for STIs, including gonorrhea and chlamydia, if: You are sexually active and are younger than 44 years of age. You are older than 44 years of age and your health care provider tells you that you are at risk for this type of infection. Your sexual activity has changed since you were last screened, and you are at increased risk for chlamydia or gonorrhea. Ask your health care provider if you are at risk. Ask your health care provider about whether you are at high risk for HIV. Your health care provider may recommend a prescription medicine to help prevent HIV infection. If you choose to take medicine to prevent HIV, you should first get tested for HIV. You should then be tested every 3 months for as long as you are taking the medicine. Pregnancy If you are about to stop having your period (premenopausal) and you may become pregnant, seek counseling before you get pregnant. Take 400 to 800 micrograms (mcg) of folic acid every day if you become pregnant. Ask for birth control (contraception) if you want to prevent pregnancy. Osteoporosis and menopause Osteoporosis is a disease in which the bones lose minerals and strength with aging. This can result in bone fractures. If you are 54 years old or older, or if you are at risk for osteoporosis and fractures, ask your health care provider if you should: Be screened for bone loss. Take a calcium or vitamin D  supplement to lower your risk of fractures. Be given hormone replacement therapy (HRT) to treat symptoms of menopause. Follow these instructions at home: Alcohol use Do not drink alcohol if: Your health care provider tells you not to drink. You are pregnant, may be pregnant, or are planning to become pregnant. If you drink alcohol: Limit how much you have to: 0-1 drink a day. Know how much alcohol is in your drink. In the [place] one drink equals one 12 oz bottle of beer (355 mL), one 5 oz glass of wine (148  mL), or one 1 oz glass of hard liquor (44 mL). Lifestyle Do not use any products that contain nicotine or tobacco. These products include cigarettes, chewing tobacco, and vaping devices, such as e-cigarettes. If you need help quitting, ask your health care provider. Do not use street drugs. Do not share needles. Ask your health care provider for help if you need support or information about quitting drugs. General instructions Schedule regular health, dental, and eye exams. Stay current with your vaccines. Tell your health care provider if: You often feel depressed. You have ever been abused or do not feel safe at home. Summary Adopting a healthy lifestyle and getting preventive care are important in promoting health and wellness. Follow your health care provider's instructions about healthy diet, exercising, and getting tested or screened for diseases. Follow your health care provider's instructions on monitoring your cholesterol and blood pressure. This information is not intended to replace advice given to you by your health care provider. Make sure you discuss any questions you have with your health  care provider. Document Revised: 12/21/2020 Document Reviewed: 12/21/2020 Elsevier Patient Education  2024 Elsevier Inc.        Emil Schaumann, MD [facility]

## 2024-04-03 NOTE — Assessment & Plan Note (Signed)
Clinically euthyroid. TSH done today Continue Synthroid 50 mcg daily.

## 2024-04-03 NOTE — Assessment & Plan Note (Signed)
 Cardiovascular and cancer risks associated with smoking discussed. Smoking cessation advice given. States she is smoking less. Recommend chest x-ray today

## 2024-04-03 NOTE — Assessment & Plan Note (Signed)
 Awaiting results of home testing for mold Has chronic allergic symptoms that could be related to this exposure Recommend blood work today May need evaluation by allergist.

## 2024-04-05 ENCOUNTER — Encounter: Payer: Self-pay | Admitting: Emergency Medicine

## 2024-04-05 ENCOUNTER — Ambulatory Visit: Payer: Self-pay | Admitting: Emergency Medicine

## 2024-04-09 LAB — ALLERGY PANEL 11, MOLD GROUP
Allergen, A. alternata, m6: <0.1 kU/L
Allergen, Mucor Racemosus, M4: <0.1 kU/L
Aspergillus fumigatus, m3: <0.1 kU/L
CLADOSPORIUM HERBARUM (M2) IGE: <0.1 kU/L
CLASS: 0
CLASS: 0
Candida Albicans: <0.1 kU/L
Class: 0
Class: 0
Class: 0

## 2024-04-09 LAB — INTERPRETATION:

## 2024-04-11 ENCOUNTER — Encounter: Payer: Self-pay | Admitting: Emergency Medicine

## 2024-04-11 ENCOUNTER — Other Ambulatory Visit: Payer: Self-pay | Admitting: Physical Medicine and Rehabilitation

## 2024-04-26 NOTE — Telephone Encounter (Signed)
 Spoke with patient and adjusted dates. Will re fax

## 2024-05-16 ENCOUNTER — Encounter: Payer: Self-pay | Admitting: Obstetrics and Gynecology

## 2024-05-16 ENCOUNTER — Ambulatory Visit (INDEPENDENT_AMBULATORY_CARE_PROVIDER_SITE_OTHER): Admitting: Obstetrics and Gynecology

## 2024-05-16 ENCOUNTER — Other Ambulatory Visit (HOSPITAL_COMMUNITY)
Admission: RE | Admit: 2024-05-16 | Discharge: 2024-05-16 | Disposition: A | Discharge status: Home / Self Care | Source: Ambulatory Visit | Attending: Family Medicine | Admitting: Family Medicine

## 2024-05-16 VITALS — BP 131/99 | HR 78 | Wt 105.0 lb

## 2024-05-16 DIAGNOSIS — Z01419 Encounter for gynecological examination (general) (routine) without abnormal findings: Secondary | ICD-10-CM | POA: Insufficient documentation

## 2024-05-16 DIAGNOSIS — Z23 Encounter for immunization: Secondary | ICD-10-CM | POA: Diagnosis not present

## 2024-05-16 NOTE — Progress Notes (Signed)
   ANNUAL EXAM Patient name: Sarah Fritz MRN [id number]  Date of birth: [birth year] Chief Complaint:   Gynecologic Exam  History of Present Illness:   Sarah Fritz is a 43 y.o. G15P1001 female being seen today for a routine annual exam.   Current concerns: None.   Current birth control: None. Here with her partner. They would be fine with it if she were to become pregnant.   Patient's last menstrual period was 05/12/2024.   Last MXR: 11/2022, birad1, extremely dense, no fmh breast ca Gardasil: #1 in 2020 Last Pap/Pap History:  h/O abnormal pap: no 2018: pap wnl 2020: pap/hpv wnl  Review of Systems:   Pertinent items are noted in HPI Denies any headaches, blurred vision, fatigue, shortness of breath, chest pain, abdominal pain, abnormal vaginal discharge/itching/odor/irritation, problems with periods, bowel movements, urination, or intercourse unless otherwise stated above.  Pertinent History Reviewed:  Reviewed past medical,surgical, social and family history.  Reviewed problem list, medications and allergies. Physical Assessment:   Vitals:   05/16/24 1538  BP: (!) 131/99  Pulse: 78  Weight: 105 lb (47.6 kg)  Body mass index is 18.02 kg/m.   Physical Examination:  General appearance - well appearing, and in no distress Mental status - alert, oriented to person, place, and time Psych:  She has a normal mood and affect Skin - warm and dry, normal color, no suspicious lesions noted Chest - effort normal Heart - normal rate  Breasts - breasts appear normal, no suspicious masses, no skin or nipple changes or axillary nodes Abdomen - soft, nontender, nondistended, no masses or organomegaly Pelvic -  Performed and: VULVA: normal appearing vulva with no masses, tenderness or lesions VAGINA: normal appearing vagina with normal color and discharge, no lesions CERVIX: normal appearing cervix without discharge or lesions, no CMT. UTERUS: Not examined ADNEXA: Not  examined Extremities:  No swelling or varicosities noted  Chaperone present for exam  No results found for this or any previous visit (from the past 24 hours).  Assessment & Plan:  Malerie was seen today for gynecologic exam.  Diagnoses and all orders for this visit:  Encounter for annual routine gynecological examination - Cervical cancer screening: Discussed guidelines. Pap with HPV today - Gardasil: Would like to get #2 today. Next shot in 4 months.  - STD Testing: accepts - Birth Control: Declines - Breast Health: Encouraged self breast awareness/SBE. Teaching provided. Discussed limits of clinical breast exam for detecting breast cancer. Rx given for MXR. Discussed breast density and risks associate. No FMH of breast cancer so no additional imaging routinely done at this time unless anything changes with how her mammogram appears, she feels a lump or if family history changes.  - F/U 12 months and prn -     Cytology - PAP -     MM 3D SCREENING MAMMOGRAM BILATERAL BREAST; Future -     RPR+HBsAg+HCVAb+...  Other orders -     HPV 9-valent vaccine,Recombinat    Orders Placed This Encounter  Procedures   MM 3D SCREENING MAMMOGRAM BILATERAL BREAST   HPV 9-valent vaccine,Recombinat   RPR+HBsAg+HCVAb+...    Meds: No orders of the defined types were placed in this encounter.   Follow-up: No follow-ups on file.  Vina Solian, MD 05/16/2024 4:58 PM

## 2024-05-16 NOTE — Patient Instructions (Signed)
 Dr. Marien Short recommends the following: Vitamin C 1000 mg daily, Vitamin E 400 mg daily, Zinc daily, Folic acid 400 mcg daily.

## 2024-05-17 LAB — RPR+HBSAG+HCVAB+...
HIV Screen 4th Generation wRfx: NONREACTIVE
Hep C Virus Ab: NONREACTIVE
Hepatitis B Surface Ag: NEGATIVE
RPR Ser Ql: NONREACTIVE

## 2024-05-20 ENCOUNTER — Ambulatory Visit: Payer: Self-pay | Admitting: Obstetrics and Gynecology

## 2024-05-20 LAB — CYTOLOGY - PAP
Chlamydia: NEGATIVE
Comment: NEGATIVE
Comment: NEGATIVE
Comment: NEGATIVE
Comment: NORMAL
Diagnosis: NEGATIVE
High risk HPV: NEGATIVE
Neisseria Gonorrhea: NEGATIVE
Trichomonas: NEGATIVE

## 2024-05-23 ENCOUNTER — Ambulatory Visit
Admission: RE | Admit: 2024-05-23 | Discharge: 2024-05-23 | Disposition: A | Discharge status: Home / Self Care | Source: Ambulatory Visit | Attending: Obstetrics and Gynecology | Admitting: Obstetrics and Gynecology

## 2024-05-23 DIAGNOSIS — Z01419 Encounter for gynecological examination (general) (routine) without abnormal findings: Secondary | ICD-10-CM

## 2024-06-17 ENCOUNTER — Encounter: Payer: Self-pay | Admitting: Radiology

## 2024-06-19 ENCOUNTER — Other Ambulatory Visit: Payer: Self-pay | Admitting: Physical Medicine and Rehabilitation

## 2024-07-10 ENCOUNTER — Ambulatory Visit

## 2024-07-22 ENCOUNTER — Ambulatory Visit: Admitting: Gastroenterology

## 2024-07-26 ENCOUNTER — Encounter: Payer: Self-pay | Admitting: Gastroenterology

## 2024-07-26 ENCOUNTER — Ambulatory Visit: Admitting: Gastroenterology

## 2024-07-26 VITALS — BP 132/80 | HR 92 | Ht 64.0 in | Wt 107.1 lb

## 2024-07-26 DIAGNOSIS — Z1211 Encounter for screening for malignant neoplasm of colon: Secondary | ICD-10-CM

## 2024-07-26 DIAGNOSIS — K5909 Other constipation: Secondary | ICD-10-CM | POA: Diagnosis not present

## 2024-07-26 DIAGNOSIS — Z800 Family history of malignant neoplasm of digestive organs: Secondary | ICD-10-CM

## 2024-07-26 MED ORDER — NA SULFATE-K SULFATE-MG SULF 17.5-3.13-1.6 GM/177ML PO SOLN: 1.0000 | Freq: Once | ORAL | 0 refills | Status: AC

## 2024-07-26 NOTE — Progress Notes (Signed)
 Chief Complaint: Colon cancer screening Primary GI MD: Sampson  HPI: Discussed the use of AI scribe software for clinical note transcription with the patient, who gave verbal consent to proceed.  History of Present Illness  Sarah Fritz is a 43 year old female who presents for a colonoscopy due to family history of colon cancer. She was referred by her primary care physician for a colonoscopy due to family history of colon cancer.  Her father was diagnosed with colon cancer in his 30s and passed away two years ago after the cancer recurred. She is now at the age where screening is advised.  She experiences constipation with infrequent bowel movements, sometimes less than once a week, which are difficult to pass and occasionally accompanied by a small amount of blood.  Her family history is significant for cancer, with her father having had colon cancer and her mother having had two types of cancer, although she can only recall one type. Her mother passed away shortly after being taken to the hospital.  No gastrointestinal symptoms other than constipation and occasional blood in the stool.    PREVIOUS GI WORKUP     Past Medical History:  Diagnosis Date   Abscess    Anemia    Chlamydia    Depression    Gonorrhea    Graves disease    Hypertension     Past Surgical History:  Procedure Laterality Date   WISDOM TOOTH EXTRACTION      Current Outpatient Medications  Medication Sig Dispense Refill   cyclobenzaprine  (FLEXERIL ) 10 MG tablet TAKE 1 TABLET(10 MG) BY MOUTH AT BEDTIME 30 tablet 0   Na Sulfate-K Sulfate-Mg Sulfate concentrate (SUPREP) 17.5-3.13-1.6 GM/177ML SOLN Take 1 kit (354 mLs total) by mouth once for 1 dose. 354 mL 0   valsartan  (DIOVAN ) 40 MG tablet Take 1 tablet (40 mg total) by mouth daily. 90 tablet 3   levothyroxine  (SYNTHROID ) 75 MCG tablet Take 75 mcg by mouth daily.     No current facility-administered medications for this visit.     Allergies as of 07/26/2024 - Review Complete 07/26/2024  Allergen Reaction Noted   Percocet [oxycodone-acetaminophen] Nausea And Vomiting and Other (See Comments) 01/06/2012   Vicodin [hydrocodone-acetaminophen] Nausea And Vomiting and Other (See Comments) 01/06/2012    Family History  Problem Relation Age of Onset   Anxiety disorder Mother    Depression Mother    Heart disease Mother    Kidney disease Mother    COPD Mother    Cancer Mother        mesothelomia   Cancer Father        prostate   COPD Father     Social History   Socioeconomic History   Marital status: Single    Spouse name: Not on file   Number of children: Not on file   Years of education: Not on file   Highest education level: Associate degree: occupational, scientist, product/process development, or vocational program  Occupational History   Not on file  Tobacco Use   Smoking status: Every Day    Current packs/day: 0.50    Types: Cigarettes   Smokeless tobacco: Current  Vaping Use   Vaping status: Former  Substance and Sexual Activity   Alcohol use: Yes    Comment: holidays   Drug use: Yes    Types: Marijuana    Comment: daily   Sexual activity: Yes    Birth control/protection: None  Other Topics Concern   Not on file  Social History Narrative   Not on file   Social Drivers of Health   Tobacco Use: High Risk (05/16/2024)   Patient History    Smoking Tobacco Use: Every Day    Smokeless Tobacco Use: Current    Passive Exposure: Not on file  Financial Resource Strain: Low Risk (04/02/2024)   Overall Financial Resource Strain (CARDIA)    Difficulty of Paying Living Expenses: Not hard at all  Food Insecurity: No Food Insecurity (05/16/2024)   Epic    Worried About Programme Researcher, Broadcasting/film/video in the Last Year: Never true    Ran Out of Food in the Last Year: Never true  Transportation Needs: No Transportation Needs (05/16/2024)   Epic    Lack of Transportation (Medical): No    Lack of Transportation (Non-Medical): No   Physical Activity: Insufficiently Active (04/02/2024)   Exercise Vital Sign    Days of Exercise per Week: 2 days    Minutes of Exercise per Session: 10 min  Stress: Stress Concern Present (04/02/2024)   [employer] of Occupational Health - Occupational Stress Questionnaire    Feeling of Stress: To some extent  Social Connections: Moderately Integrated (04/02/2024)   Social Connection and Isolation Panel    Frequency of Communication with Friends and Family: Three times a week    Frequency of Social Gatherings with Friends and Family: Twice a week    Attends Religious Services: 1 to 4 times per year    Active Member of Clubs or Organizations: No    Attends Engineer, Structural: Not on file    Marital Status: Living with partner  Intimate Partner Violence: Not on file  Depression (PHQ2-9): Low Risk (05/16/2024)   Depression (PHQ2-9)    PHQ-2 Score: 0  Recent Concern: Depression (PHQ2-9) - High Risk (04/03/2024)   Depression (PHQ2-9)    PHQ-2 Score: 17  Alcohol Screen: Low Risk (11/27/2023)   Alcohol Screen    Last Alcohol Screening Score (AUDIT): 1  Housing: Unknown (04/02/2024)   Epic    Unable to Pay for Housing in the Last Year: No    Number of Times Moved in the Last Year: Not on file    Homeless in the Last Year: No  Utilities: Not on file  Health Literacy: Not on file    Review of Systems:    Constitutional: No weight loss, fever, chills, weakness or fatigue HEENT: Eyes: No change in vision               Ears, Nose, Throat:  No change in hearing or congestion Skin: No rash or itching Cardiovascular: No chest pain, chest pressure or palpitations   Respiratory: No SOB or cough Gastrointestinal: See HPI and otherwise negative Genitourinary: No dysuria or change in urinary frequency Neurological: No headache, dizziness or syncope Musculoskeletal: No new muscle or joint pain Hematologic: No bleeding or bruising Psychiatric: No history of depression or  anxiety    Physical Exam:  Vital signs: BP 132/80   Pulse 92   Ht 5' 4 (1.626 m)   Wt 107 lb 2 oz (48.6 kg)   BMI 18.39 kg/m   Constitutional: NAD, alert and cooperative Head:  Normocephalic and atraumatic. Eyes:   PEERL, EOMI. No icterus. Conjunctiva pink. Respiratory: Respirations even and unlabored. Lungs clear to auscultation bilaterally.   No wheezes, crackles, or rhonchi.  Cardiovascular:  Regular rate and rhythm. No peripheral edema, cyanosis or pallor.  Gastrointestinal:  Soft, nondistended, nontender. No rebound or guarding. Normal bowel sounds.  No appreciable masses or hepatomegaly. Rectal:  Declines Msk:  Symmetrical without gross deformities. Without edema, no deformity or joint abnormality.  Neurologic:  Alert and  oriented x4;  grossly normal neurologically.  Skin:   Dry and intact without significant lesions or rashes. Psychiatric: Oriented to person, place and time. Demonstrates good judgement and reason without abnormal affect or behaviors.  Physical Exam    RELEVANT LABS AND IMAGING: CBC    Component Value Date/Time   WBC 5.8 04/03/2024 1606   RBC 4.91 04/03/2024 1606   HGB 11.7 (L) 04/03/2024 1606   HGB 11.1 11/23/2016 1609   HCT 36.5 04/03/2024 1606   HCT 35.7 11/23/2016 1609   PLT 239.0 04/03/2024 1606   PLT 246 11/23/2016 1609   MCV 74.2 (L) 04/03/2024 1606   MCV 70 (L) 11/23/2016 1609   MCH 24.9 (L) 11/16/2021 2053   MCHC 32.2 04/03/2024 1606   RDW 16.8 (H) 04/03/2024 1606   RDW 18.8 (H) 11/23/2016 1609   LYMPHSABS 1.7 04/03/2024 1606   LYMPHSABS 2.1 11/23/2016 1609   MONOABS 0.5 04/03/2024 1606   EOSABS 0.4 04/03/2024 1606   EOSABS 0.1 11/23/2016 1609   BASOSABS 0.1 04/03/2024 1606   BASOSABS 0.0 11/23/2016 1609    CMP     Component Value Date/Time   NA 138 04/03/2024 1606   NA 139 11/23/2016 1609   K 3.8 04/03/2024 1606   CL 104 04/03/2024 1606   CO2 28 04/03/2024 1606   GLUCOSE 75 04/03/2024 1606   BUN 7 04/03/2024 1606   BUN  8 11/23/2016 1609   CREATININE 0.77 04/03/2024 1606   CALCIUM 9.1 04/03/2024 1606   PROT 7.6 04/03/2024 1606   PROT 6.4 11/23/2016 1609   ALBUMIN 4.4 04/03/2024 1606   ALBUMIN 4.0 11/23/2016 1609   AST 13 04/03/2024 1606   ALT 13 04/03/2024 1606   ALKPHOS 50 04/03/2024 1606   BILITOT 0.8 04/03/2024 1606   BILITOT 0.5 11/23/2016 1609   GFRNONAA >60 11/16/2021 2053   GFRAA >60 11/02/2017 0756     Assessment/Plan:   Colorectal cancer screening due to family history Colorectal cancer screening indicated due to family history in father dx in his 30s. Increased risk necessitates colonoscopy for early detection and prevention. - Scheduled colonoscopy  -2-day prep - Provided colonoscopy preparation instructions. - I thoroughly discussed the procedure with the patient (at bedside) to include nature of the procedure, alternatives, benefits, and risks (including but not limited to bleeding, infection, perforation, anesthesia/cardiac pulmonary complications).  Patient verbalized understanding and gave verbal consent to proceed with procedure.   Chronic Constipation Chronic constipation with 1 bowel movement per week, hard stools, straining.  Not on a bowel regimen. - Initiated Miralax, one cap daily - Encouraged increased water intake. - Increase fiber, increase water, increase exercise   Nestor Mollie DEVONNA [facility] 07/26/2024, 3:23 PM  Cc: Purcell Emil Schanz, MD

## 2024-07-26 NOTE — Patient Instructions (Addendum)
 Start taking Miralax 1 capful (17 grams) 1x / day for 1 week.   If this is not effective, increase to 1 dose 2x / day for 1 week.   If this is still not effective, increase to two capfuls (34 grams) 2x / day.   Can adjust dose as needed based on response. Can take 1/2 cap daily, skip days, or increase per day.    You have been scheduled for a colonoscopy. Please follow written instructions given to you at your visit today.   If you use inhalers (even only as needed), please bring them with you on the day of your procedure.  DO NOT TAKE 7 DAYS PRIOR TO TEST- Trulicity (dulaglutide) Ozempic, Wegovy (semaglutide) Mounjaro, Zepbound (tirzepatide) Bydureon Bcise (exanatide extended release)  DO NOT TAKE 1 DAY PRIOR TO YOUR TEST Rybelsus (semaglutide) Adlyxin (lixisenatide) Victoza (liraglutide) Byetta (exanatide) ___________________________________________________________________________   We have sent the following medications to your pharmacy for you to pick up at your convenience: Suprep  _______________________________________________________  If your blood pressure at your visit was 140/90 or greater, please contact your primary care physician to follow up on this.  _______________________________________________________  If you are age 74 or older, your body mass index should be between 23-30. Your Body mass index is 18.39 kg/m. If this is out of the aforementioned range listed, please consider follow up with your Primary Care Provider.  If you are age 64 or younger, your body mass index should be between 19-25. Your Body mass index is 18.39 kg/m. If this is out of the aformentioned range listed, please consider follow up with your Primary Care Provider.   ________________________________________________________  The [facility] providers would like to encourage you to use MYCHART to communicate with providers for non-urgent requests or questions.  Due to long hold times on  the telephone, sending your provider a message by [facility] may be a faster and more efficient way to get a response.  Please allow 48 business hours for a response.  Please remember that this is for non-urgent requests.  _______________________________________________________  [facility] is using a team-based approach to care.  Your team is made up of your doctor and two to three APPS. Our APPS (Nurse Practitioners and Physician Assistants) work with your physician to ensure care continuity for you. They are fully qualified to address your health concerns and develop a treatment plan. They communicate directly with your gastroenterologist to care for you. Seeing the Advanced Practice Practitioners on your physician's team can help you by facilitating care more promptly, often allowing for earlier appointments, access to diagnostic testing, procedures, and other specialty referrals.   Due to recent changes in healthcare laws, you may see the results of your imaging and laboratory studies on MyChart before your provider has had a chance to review them.  We understand that in some cases there may be results that are confusing or concerning to you. Not all laboratory results come back in the same time frame and the provider may be waiting for multiple results in order to interpret others.  Please give us  48 hours in order for your provider to thoroughly review all the results before contacting the office for clarification of your results.

## 2024-08-02 ENCOUNTER — Ambulatory Visit: Admitting: Gastroenterology

## 2024-08-02 ENCOUNTER — Encounter: Payer: Self-pay | Admitting: Gastroenterology

## 2024-08-02 VITALS — BP 99/65 | HR 83 | Temp 98.1°F | Resp 14 | Ht 64.0 in | Wt 107.0 lb

## 2024-08-02 DIAGNOSIS — Z800 Family history of malignant neoplasm of digestive organs: Secondary | ICD-10-CM

## 2024-08-02 DIAGNOSIS — K640 First degree hemorrhoids: Secondary | ICD-10-CM | POA: Diagnosis not present

## 2024-08-02 DIAGNOSIS — K5909 Other constipation: Secondary | ICD-10-CM

## 2024-08-02 DIAGNOSIS — Z1211 Encounter for screening for malignant neoplasm of colon: Secondary | ICD-10-CM | POA: Diagnosis present

## 2024-08-02 MED ORDER — SODIUM CHLORIDE 0.9 % IV SOLN: 500.0000 mL | Freq: Once | INTRAVENOUS | Status: DC

## 2024-08-02 NOTE — Progress Notes (Signed)
 History and Physical Interval Note:  08/02/2024 11:22 AM  Sarah Fritz  has presented today for endoscopic procedure(s), with the diagnosis of  Encounter Diagnoses  Name Primary?   Family history of colon cancer Yes   Chronic constipation    Colon cancer screening   .  The various methods of evaluation and treatment have been discussed with the patient and/or family. After consideration of risks, benefits and other options for treatment, the patient has consented to  the endoscopic procedure(s).   The patient's history has been reviewed, patient examined, no change in status, stable for endoscopic procedure(s).  I have reviewed the patient's chart and labs.  Questions were answered to the patient's satisfaction.     Aniketh Huberty E. Stacia, MD [facility]

## 2024-08-02 NOTE — Progress Notes (Signed)
 Sedate, gd SR, tolerated procedure well, VSS, report to RN

## 2024-08-02 NOTE — Patient Instructions (Signed)

## 2024-08-02 NOTE — Progress Notes (Signed)
 Pt's states no medical or surgical changes since previsit or office visit.

## 2024-08-02 NOTE — Op Note (Signed)
 [facility] Patient Name: Sarah Fritz Procedure Date: 08/02/2024 11:16 AM MRN: [id number] Endoscopist: Glendia E. Stacia , MD, [id number] Age: 43 Referring MD:  Date of Birth: 1981/08/13 Gender: Female Account #: [id number] Procedure:                Colonoscopy Indications:              Screening in patient at increased risk: Colorectal                            cancer in father before age 30 Medicines:                Monitored Anesthesia Care Procedure:                Pre-Anesthesia Assessment:                           - Prior to the procedure, a History and Physical                            was performed, and patient medications and                            allergies were reviewed. The patient's tolerance of                            previous anesthesia was also reviewed. The risks                            and benefits of the procedure and the sedation                            options and risks were discussed with the patient.                            All questions were answered, and informed consent                            was obtained. Prior Anticoagulants: The patient has                            taken no anticoagulant or antiplatelet agents. ASA                            Grade Assessment: II - A patient with mild systemic                            disease. After reviewing the risks and benefits,                            the patient was deemed in satisfactory condition to                            undergo the procedure.  After obtaining informed consent, the colonoscope                            was passed under direct vision. Throughout the                            procedure, the patient's blood pressure, pulse, and                            oxygen saturations were monitored continuously. The                            Olympus Scope [id number] was introduced through the                            anus and  advanced to the the terminal ileum, with                            identification of the appendiceal orifice and IC                            valve. The colonoscopy was performed without                            difficulty. The patient tolerated the procedure                            well. The quality of the bowel preparation was                            adequate. The terminal ileum, ileocecal valve,                            appendiceal orifice, and rectum were photographed.                            The bowel preparation used was SUPREP via split                            dose instruction. Scope In: 11:32:35 AM Scope Out: 11:43:56 AM Scope Withdrawal Time: 0 hours 7 minutes 42 seconds  Total Procedure Duration: 0 hours 11 minutes 21 seconds  Findings:                 The perianal and digital rectal examinations were                            normal. Pertinent negatives include normal                            sphincter tone and no palpable rectal lesions.                           The colon (entire examined portion) appeared normal.  The terminal ileum appeared normal.                           Non-bleeding internal hemorrhoids were found during                            retroflexion. The hemorrhoids were Grade I                            (internal hemorrhoids that do not prolapse).                           No additional abnormalities were found on                            retroflexion. Complications:            No immediate complications. Estimated Blood Loss:     Estimated blood loss: none. Impression:               - The entire examined colon is normal.                           - The examined portion of the ileum was normal.                           - Non-bleeding internal hemorrhoids.                           - No specimens collected. Recommendation:           - Patient has a contact number available for                             emergencies. The signs and symptoms of potential                            delayed complications were discussed with the                            patient. Return to normal activities tomorrow.                            Written discharge instructions were provided to the                            patient.                           - Resume previous diet.                           - Continue present medications.                           - Repeat colonoscopy in 5 years for screening  purposes. Viral Schramm E. Stacia, MD 08/02/2024 11:47:28 AM This report has been signed electronically.

## 2024-08-04 NOTE — Progress Notes (Signed)
 Agree with the assessment and plan as outlined by Boone Master, PA-C.

## 2024-08-05 ENCOUNTER — Telehealth: Payer: Self-pay | Admitting: Lactation Services

## 2024-08-05 NOTE — Telephone Encounter (Signed)
 No answer left voice mail

## 2024-08-25 ENCOUNTER — Other Ambulatory Visit: Payer: Self-pay | Admitting: Physical Medicine and Rehabilitation

## 2025-04-07 ENCOUNTER — Encounter: Admitting: Emergency Medicine
